# Patient Record
Sex: Female | Born: 1955
Health system: Southern US, Community
[De-identification: ages and names within clinical notes are randomized; demographics above are authoritative.]

## PROBLEM LIST (undated history)

## (undated) DIAGNOSIS — Z9889 Other specified postprocedural states: Secondary | ICD-10-CM

## (undated) DIAGNOSIS — I10 Essential (primary) hypertension: Secondary | ICD-10-CM

## (undated) DIAGNOSIS — F32A Depression, unspecified: Secondary | ICD-10-CM

## (undated) DIAGNOSIS — Z9081 Acquired absence of spleen: Secondary | ICD-10-CM

## (undated) DIAGNOSIS — R112 Nausea with vomiting, unspecified: Secondary | ICD-10-CM

## (undated) DIAGNOSIS — D75838 Other thrombocytosis: Secondary | ICD-10-CM

## (undated) DIAGNOSIS — F329 Major depressive disorder, single episode, unspecified: Secondary | ICD-10-CM

## (undated) DIAGNOSIS — R7989 Other specified abnormal findings of blood chemistry: Secondary | ICD-10-CM

## (undated) DIAGNOSIS — F419 Anxiety disorder, unspecified: Secondary | ICD-10-CM

## (undated) DIAGNOSIS — R0600 Dyspnea, unspecified: Secondary | ICD-10-CM

## (undated) DIAGNOSIS — J984 Other disorders of lung: Secondary | ICD-10-CM

## (undated) DIAGNOSIS — K589 Irritable bowel syndrome without diarrhea: Secondary | ICD-10-CM

## (undated) HISTORY — PX: ABDOMINAL HYSTERECTOMY: SHX81

## (undated) HISTORY — PX: BREAST BIOPSY: SHX20

## (undated) HISTORY — PX: ESOPHAGOGASTRODUODENOSCOPY: SHX1529

---

## 1998-05-27 ENCOUNTER — Encounter: Payer: Self-pay | Admitting: Family Medicine

## 1998-05-27 ENCOUNTER — Ambulatory Visit (HOSPITAL_COMMUNITY): Admission: RE | Admit: 1998-05-27 | Discharge: 1998-05-27 | Payer: Self-pay | Admitting: Family Medicine

## 1998-06-13 ENCOUNTER — Ambulatory Visit (HOSPITAL_COMMUNITY): Admission: RE | Admit: 1998-06-13 | Discharge: 1998-06-13 | Payer: Self-pay | Admitting: *Deleted

## 1998-07-18 ENCOUNTER — Encounter: Payer: Self-pay | Admitting: General Surgery

## 1998-07-23 ENCOUNTER — Encounter: Payer: Self-pay | Admitting: General Surgery

## 1998-07-23 ENCOUNTER — Observation Stay (HOSPITAL_COMMUNITY): Admission: RE | Admit: 1998-07-23 | Discharge: 1998-07-24 | Payer: Self-pay | Admitting: General Surgery

## 1998-10-29 ENCOUNTER — Encounter: Payer: Self-pay | Admitting: General Surgery

## 1998-10-29 ENCOUNTER — Ambulatory Visit (HOSPITAL_COMMUNITY): Admission: RE | Admit: 1998-10-29 | Discharge: 1998-10-29 | Payer: Self-pay | Admitting: General Surgery

## 1999-04-10 ENCOUNTER — Encounter: Payer: Self-pay | Admitting: General Surgery

## 1999-04-10 ENCOUNTER — Ambulatory Visit (HOSPITAL_COMMUNITY): Admission: RE | Admit: 1999-04-10 | Discharge: 1999-04-10 | Payer: Self-pay | Admitting: General Surgery

## 1999-04-27 ENCOUNTER — Ambulatory Visit (HOSPITAL_BASED_OUTPATIENT_CLINIC_OR_DEPARTMENT_OTHER): Admission: RE | Admit: 1999-04-27 | Discharge: 1999-04-27 | Payer: Self-pay | Admitting: General Surgery

## 2001-10-14 ENCOUNTER — Ambulatory Visit: Admission: RE | Admit: 2001-10-14 | Discharge: 2001-10-15 | Payer: Self-pay | Admitting: Orthopedic Surgery

## 2001-10-14 ENCOUNTER — Encounter: Payer: Self-pay | Admitting: General Surgery

## 2003-02-15 ENCOUNTER — Encounter: Admission: RE | Admit: 2003-02-15 | Discharge: 2003-02-15 | Payer: Self-pay | Admitting: Family Medicine

## 2003-02-15 ENCOUNTER — Encounter: Payer: Self-pay | Admitting: Family Medicine

## 2004-06-11 ENCOUNTER — Encounter (INDEPENDENT_AMBULATORY_CARE_PROVIDER_SITE_OTHER): Payer: Self-pay | Admitting: Specialist

## 2004-06-11 ENCOUNTER — Ambulatory Visit (HOSPITAL_COMMUNITY): Admission: RE | Admit: 2004-06-11 | Discharge: 2004-06-11 | Payer: Self-pay | Admitting: Gastroenterology

## 2005-10-03 ENCOUNTER — Emergency Department (HOSPITAL_COMMUNITY): Admission: EM | Admit: 2005-10-03 | Discharge: 2005-10-03 | Payer: Self-pay | Admitting: *Deleted

## 2005-10-05 ENCOUNTER — Encounter: Admission: RE | Admit: 2005-10-05 | Discharge: 2005-10-05 | Payer: Self-pay | Admitting: Family Medicine

## 2006-04-29 ENCOUNTER — Ambulatory Visit: Payer: Self-pay | Admitting: Family Medicine

## 2006-05-09 ENCOUNTER — Emergency Department: Payer: Self-pay | Admitting: Internal Medicine

## 2006-05-09 ENCOUNTER — Other Ambulatory Visit: Payer: Self-pay

## 2006-05-11 ENCOUNTER — Other Ambulatory Visit: Payer: Self-pay

## 2006-05-11 ENCOUNTER — Emergency Department: Payer: Self-pay

## 2006-05-12 ENCOUNTER — Ambulatory Visit: Payer: Self-pay | Admitting: Specialist

## 2006-07-14 ENCOUNTER — Ambulatory Visit: Payer: Self-pay | Admitting: Specialist

## 2006-08-17 ENCOUNTER — Ambulatory Visit: Payer: Self-pay | Admitting: Specialist

## 2006-09-12 ENCOUNTER — Ambulatory Visit: Payer: Self-pay | Admitting: Specialist

## 2006-10-07 ENCOUNTER — Ambulatory Visit: Payer: Self-pay | Admitting: Unknown Physician Specialty

## 2006-11-10 ENCOUNTER — Ambulatory Visit: Payer: Self-pay | Admitting: Family Medicine

## 2006-11-15 ENCOUNTER — Ambulatory Visit: Payer: Self-pay | Admitting: Family Medicine

## 2008-08-20 ENCOUNTER — Ambulatory Visit (HOSPITAL_BASED_OUTPATIENT_CLINIC_OR_DEPARTMENT_OTHER): Admission: RE | Admit: 2008-08-20 | Discharge: 2008-08-20 | Payer: Self-pay | Admitting: Orthopedic Surgery

## 2009-09-04 ENCOUNTER — Emergency Department (HOSPITAL_COMMUNITY): Admission: EM | Admit: 2009-09-04 | Discharge: 2009-09-04 | Payer: Self-pay | Admitting: Emergency Medicine

## 2009-10-14 ENCOUNTER — Inpatient Hospital Stay (HOSPITAL_COMMUNITY): Admission: EM | Admit: 2009-10-14 | Discharge: 2009-10-20 | Payer: Self-pay | Admitting: Emergency Medicine

## 2009-11-18 ENCOUNTER — Inpatient Hospital Stay: Payer: Self-pay | Admitting: Surgery

## 2010-01-16 ENCOUNTER — Ambulatory Visit: Payer: Self-pay | Admitting: Surgery

## 2010-03-12 ENCOUNTER — Encounter: Admission: RE | Admit: 2010-03-12 | Discharge: 2010-03-12 | Payer: Self-pay | Admitting: General Surgery

## 2010-04-17 ENCOUNTER — Ambulatory Visit: Payer: Self-pay | Admitting: Family Medicine

## 2010-08-17 LAB — BASIC METABOLIC PANEL
BUN: 2 mg/dL — ABNORMAL LOW (ref 6–23)
BUN: 6 mg/dL (ref 6–23)
CO2: 29 mEq/L (ref 19–32)
Chloride: 101 mEq/L (ref 96–112)
Chloride: 102 mEq/L (ref 96–112)
Chloride: 107 mEq/L (ref 96–112)
Creatinine, Ser: 0.67 mg/dL (ref 0.4–1.2)
GFR calc Af Amer: >60 mL/min (ref >60)
GFR calc Af Amer: >60 mL/min (ref >60)
GFR calc non Af Amer: >60 mL/min (ref >60)
Potassium: 3.2 mEq/L — ABNORMAL LOW (ref 3.5–5.1)
Potassium: 3.6 mEq/L (ref 3.5–5.1)
Potassium: 4.4 mEq/L (ref 3.5–5.1)
Sodium: 137 mEq/L (ref 135–145)
Sodium: 139 mEq/L (ref 135–145)

## 2010-08-17 LAB — CBC
HCT: 26.8 % — ABNORMAL LOW (ref 36.0–46.0)
HCT: 27.3 % — ABNORMAL LOW (ref 36.0–46.0)
HCT: 27.4 % — ABNORMAL LOW (ref 36.0–46.0)
HCT: 28.1 % — ABNORMAL LOW (ref 36.0–46.0)
HCT: 28.9 % — ABNORMAL LOW (ref 36.0–46.0)
HCT: 30.3 % — ABNORMAL LOW (ref 36.0–46.0)
Hemoglobin: 10.6 g/dL — ABNORMAL LOW (ref 12.0–15.0)
Hemoglobin: 10.6 g/dL — ABNORMAL LOW (ref 12.0–15.0)
Hemoglobin: 9.4 g/dL — ABNORMAL LOW (ref 12.0–15.0)
Hemoglobin: 9.9 g/dL — ABNORMAL LOW (ref 12.0–15.0)
MCHC: 34.3 g/dL (ref 30.0–36.0)
MCHC: 34.7 g/dL (ref 30.0–36.0)
MCHC: 35.1 g/dL (ref 30.0–36.0)
MCHC: 35.1 g/dL (ref 30.0–36.0)
MCHC: 35.2 g/dL (ref 30.0–36.0)
MCV: 88.4 fL (ref 78.0–100.0)
MCV: 89.1 fL (ref 78.0–100.0)
MCV: 89.6 fL (ref 78.0–100.0)
MCV: 89.9 fL (ref 78.0–100.0)
Platelets: 237 10*3/uL (ref 150–400)
Platelets: 238 10*3/uL (ref 150–400)
Platelets: 278 10*3/uL (ref 150–400)
Platelets: 281 10*3/uL (ref 150–400)
RBC: 3.01 MIL/uL — ABNORMAL LOW (ref 3.87–5.11)
RBC: 3.05 MIL/uL — ABNORMAL LOW (ref 3.87–5.11)
RBC: 3.05 MIL/uL — ABNORMAL LOW (ref 3.87–5.11)
RBC: 3.18 MIL/uL — ABNORMAL LOW (ref 3.87–5.11)
RBC: 3.42 MIL/uL — ABNORMAL LOW (ref 3.87–5.11)
RBC: 3.43 MIL/uL — ABNORMAL LOW (ref 3.87–5.11)
RDW: 13.1 % (ref 11.5–15.5)
RDW: 13.1 % (ref 11.5–15.5)
RDW: 13.2 % (ref 11.5–15.5)
RDW: 13.2 % (ref 11.5–15.5)
WBC: 11.4 10*3/uL — ABNORMAL HIGH (ref 4.0–10.5)
WBC: 15.3 10*3/uL — ABNORMAL HIGH (ref 4.0–10.5)
WBC: 15.9 10*3/uL — ABNORMAL HIGH (ref 4.0–10.5)
WBC: 16.9 10*3/uL — ABNORMAL HIGH (ref 4.0–10.5)
WBC: 18.8 10*3/uL — ABNORMAL HIGH (ref 4.0–10.5)

## 2010-08-17 LAB — POCT I-STAT, CHEM 8
BUN: 7 mg/dL (ref 6–23)
Creatinine, Ser: 0.6 mg/dL (ref 0.4–1.2)
HCT: 34 % — ABNORMAL LOW (ref 36.0–46.0)
Hemoglobin: 11.6 g/dL — ABNORMAL LOW (ref 12.0–15.0)

## 2010-08-17 LAB — DIFFERENTIAL
Basophils Absolute: 0 10*3/uL (ref 0.0–0.1)
Basophils Relative: 0 % (ref 0–1)
Eosinophils Absolute: 0 10*3/uL (ref 0.0–0.7)
Eosinophils Relative: 0 % (ref 0–5)
Lymphs Abs: 0.6 10*3/uL — ABNORMAL LOW (ref 0.7–4.0)
Lymphs Abs: 1.7 10*3/uL (ref 0.7–4.0)
Monocytes Absolute: 0.2 10*3/uL (ref 0.1–1.0)
Monocytes Relative: 7 % (ref 3–12)
Neutro Abs: 12.1 10*3/uL — ABNORMAL HIGH (ref 1.7–7.7)
Neutrophils Relative %: 94 % — ABNORMAL HIGH (ref 43–77)

## 2010-08-17 LAB — URINALYSIS, MICROSCOPIC ONLY
Bilirubin Urine: NEGATIVE
Glucose, UA: NEGATIVE mg/dL
Ketones, ur: NEGATIVE mg/dL
Protein, ur: 30 mg/dL — AB

## 2010-08-17 LAB — CROSSMATCH
ABO/RH(D): O POS
Antibody Screen: NEGATIVE

## 2010-08-17 LAB — COMPREHENSIVE METABOLIC PANEL
ALT: 43 U/L — ABNORMAL HIGH (ref 0–35)
AST: 68 U/L — ABNORMAL HIGH (ref 0–37)
Alkaline Phosphatase: 98 U/L (ref 39–117)
BUN: 7 mg/dL (ref 6–23)
Calcium: 9 mg/dL (ref 8.4–10.5)
Creatinine, Ser: 0.75 mg/dL (ref 0.4–1.2)
Glucose, Bld: 192 mg/dL — ABNORMAL HIGH (ref 70–99)
Total Protein: 6.4 g/dL (ref 6.0–8.3)

## 2010-08-17 LAB — MRSA PCR SCREENING: MRSA by PCR: NEGATIVE

## 2010-08-17 LAB — APTT: aPTT: 26 seconds (ref 24–37)

## 2010-08-17 LAB — PROTIME-INR: Prothrombin Time: 13.7 seconds (ref 11.6–15.2)

## 2010-08-17 LAB — CARDIAC PANEL(CRET KIN+CKTOT+MB+TROPI): CK, MB: 1.4 ng/mL (ref 0.3–4.0)

## 2010-08-19 LAB — COMPREHENSIVE METABOLIC PANEL
AST: 23 U/L (ref 0–37)
Albumin: 4.2 g/dL (ref 3.5–5.2)
BUN: 7 mg/dL (ref 6–23)
Calcium: 9.9 mg/dL (ref 8.4–10.5)
Chloride: 107 mEq/L (ref 96–112)
Creatinine, Ser: 0.59 mg/dL (ref 0.4–1.2)
GFR calc Af Amer: >60 mL/min (ref >60)
GFR calc non Af Amer: >60 mL/min (ref >60)
Total Bilirubin: 0.6 mg/dL (ref 0.3–1.2)

## 2010-08-19 LAB — CBC
HCT: 36.8 % (ref 36.0–46.0)
MCHC: 34.9 g/dL (ref 30.0–36.0)
MCV: 88 fL (ref 78.0–100.0)
Platelets: 241 10*3/uL (ref 150–400)

## 2010-08-19 LAB — D-DIMER, QUANTITATIVE: D-Dimer, Quant: <0.22 ug/mL-FEU (ref 0.00–0.48)

## 2010-08-19 LAB — DIFFERENTIAL
Basophils Absolute: 0 10*3/uL (ref 0.0–0.1)
Eosinophils Relative: 5 % (ref 0–5)
Lymphocytes Relative: 17 % (ref 12–46)
Lymphs Abs: 1.1 10*3/uL (ref 0.7–4.0)
Monocytes Absolute: 0.3 10*3/uL (ref 0.1–1.0)
Neutro Abs: 4.7 10*3/uL (ref 1.7–7.7)

## 2010-08-19 LAB — POCT CARDIAC MARKERS
CKMB, poc: 1.5 ng/mL (ref 1.0–8.0)
Myoglobin, poc: 67.2 ng/mL (ref 12–200)

## 2010-09-10 LAB — POCT I-STAT, CHEM 8
BUN: 8 mg/dL (ref 6–23)
Creatinine, Ser: 0.6 mg/dL (ref 0.4–1.2)
Glucose, Bld: 101 mg/dL — ABNORMAL HIGH (ref 70–99)
Potassium: 3.7 mEq/L (ref 3.5–5.1)
Sodium: 143 mEq/L (ref 135–145)

## 2010-10-13 NOTE — Op Note (Signed)
NAMESKYELER, SCALESE                ACCOUNT NO.:  [id number]   MEDICAL RECORD NO.:  [id number]          PATIENT TYPE:  AMB   LOCATION:  DSC                          FACILITY:  [facility]   PHYSICIAN:  Katy Fitch. Sypher, M.D. DATE OF BIRTH:  1956/01/12   DATE OF PROCEDURE:  08/20/2008  DATE OF DISCHARGE:                               OPERATIVE REPORT   PREOPERATIVE DIAGNOSIS:  Entrapment neuropathy, median nerve, right  carpal tunnel.   POSTOPERATIVE DIAGNOSIS:  Entrapment neuropathy, median nerve, right  carpal tunnel.   OPERATIONS:  Release of right transcarpal ligament.   OPERATIONS:  Katy Fitch. Sypher, MD   ASSISTANT:  Marveen Reeks Dasnoit, PA-C   ANESTHESIA:  General by LMA.   SUPERVISING ANESTHESIOLOGIST:  Germaine Pomfret, MD   INDICATIONS:  Holly Bowen is a well-known 55 year old woman who has  worked as a [employer] manager.  She has had a history of chronic hand numbness.  Months back, we diagnosed carpal tunnel syndrome.  She was treated with  steroid injection and splinting without success.  We recommend  electrodiagnostic studies.  Her worker's compensation carrier required  that she see an independent neurologist for consult.  She was seen by  Dr. Amelia Jo for evaluation and management and had a clinical  examination followed by electrodiagnostic studies.  Dr. Clarisse Gouge concluded  she did indeed have carpal tunnel syndrome.  She is now brought to the  operating room for release of her right transcarpal ligament due to the  presence of carpal tunnel syndrome unresponsive to nonoperative  measures.   After informed consent, she is brought to the operating room at this  time anticipating release of right transcarpal ligament.   PROCEDURE:  Jashanti Clinkscale was brought to the operating room and placed in  the supine position upon the operating table.   Following an anesthesia consult by Dr. Jairo Ben, general  anesthesia by LMA technique was recommended and accepted.  Ms. Pollino  was  brought to room #6 at the [facility], placed in the supine  position upon the operating table, and under Dr. Edison Pace direct  supervision general anesthesia by LMA technique was induced.   The right arm was prepped with Betadine soap and solution, sterilely  draped.  A pneumatic tourniquet was applied to the proximal right  brachium.   Following exsanguination of the right arm with Esmarch bandage, the  arterial tourniquet was inflated to 250 mmHg.  The procedure commenced  with a short incision in the line of the ring finger and palm.  Subcutaneous tissues were carefully divided revealing the palmar fascia.  This split longitudinally to the common sensory branch of the median  nerve.   These were followed back to the transverse carpal ligament which was  gently isolated from the median nerve.  The ligament was then released  along its ulnar border extending into the distal forearm.   This widely opened the carpal canal.  No mass or other predicaments were  noted.  Bleeding points along the margin of the released ligament were  electrocauterized with bipolar current followed by repair of  the skin  with intradermal 3-0 Prolene suture.   A compressive dressing was supplied with a volar plaster splint  maintaining the wrist in 5 degrees of dorsiflexion.   For aftercare, Ms. Wachs is provided a prescription for Percocet 5 mg 1  p.o. q.4-6 hours p.r.n. pain, 20 tablets without refill.      Katy Fitch Sypher, M.D.  Electronically Signed     RVS/MEDQ  D:  08/20/2008  T:  08/21/2008  Job:  [id number]

## 2010-10-16 NOTE — Op Note (Signed)
Holly Bowen, Holly Bowen                ACCOUNT NO.:  [id number]   MEDICAL RECORD NO.:  [id number]          PATIENT TYPE:  AMB   LOCATION:  ENDO                         FACILITY:  [facility]   PHYSICIAN:  Graylin Shiver, M.D.   DATE OF BIRTH:  1955/10/24   DATE OF PROCEDURE:  06/11/2004  DATE OF DISCHARGE:                                 OPERATIVE REPORT   INDICATIONS:  Family history of colon polyps.   Informed consent was obtained after explanation of the risks of bleeding,  infection and perforation.   PREMEDICATIONS:  Fentanyl 100 mcg IV, Versed 10 milligrams IV.   PROCEDURE:  With the patient in the left lateral decubitus position, a  rectal exam was performed.  No masses were felt.  The Olympus colonoscope  was inserted into the rectum and advanced around the colon to the cecum.  The cecal landmarks were identified.  In the cecum there was a 6-7 mm  elongated sessile polyp which was snared and removed by snare cautery  technique.  The cautery site looked good, and the polyp was retrieved. The  ascending colon looked normal.  The transverse colon looked normal.  Along  the length of the descending colon, there were four small sessile polyps  removed by snare cautery technique with a mini snare. The cautery sites  looked good.  The polyps were retrieved.  The sigmoid looked normal.  The  rectum looked normal.  She tolerated the procedure well without  complications.   IMPRESSION:  Multiple colon polyps.   PLAN:  The pathology will be checked.       SFG/MEDQ  D:  06/11/2004  T:  06/11/2004  Job:  [id number]   cc:   Clovis Riley, FNP  Deboraha Sprang at [facility]

## 2010-10-16 NOTE — Op Note (Signed)
[place]  Patient:    Holly Bowen                        MRN: [id number] Proc. Date: 04/27/99 Adm. Date:  [id number] Attending:  Brandy Hale CC:         Chales Salmon. Abigail Miyamoto, M.D.                           Operative Report  PREOPERATIVE DIAGNOSIS:  Multiple enlarging irregular nevi of the right face, right chest wall and left wrist.  POSTOPERATIVE DIAGNOSIS:  Multiple enlarging irregular nevi of the right face, right chest wall and left wrist.  OPERATION PERFORMED: 1. Excision of 1.5 cm diameter nevus of the left breast. 2. Excision of 8 mm diameter nevus of the right chest wall. 3. Excision of 3 mm nevus of the right face.  SURGEON:  Angelia Mould. Derrell Lolling, M.D.  ANESTHESIA:  INDICATIONS FOR PROCEDURE:  The patient is a 55 year old white female who was recently evaluated for multiple nevi.  She states that she has nevi of the left  breast, right chest wall and right face which have been enlarging and are itching. On examination, she has a 1.5 cm by 1.2 cm irregular shaped raised pigmented nevus of the left lateral breast skin.  She also has an 8 mm elliptical shaped, raised pigmented irregular bordered nevus of the right chest wall.  She also has a 3 mm raised nevus of the right face just on the right cheek bone below the lateral canthus of the right eye.  She is brought to [facility] minor surgery room for excision of these nevi.  DESCRIPTION OF PROCEDURE:  The patient was brought to the minor surgery room at  [facility]. We excised all three nevi with separate clean instruments following Betadine prepping and draping and local anesthesia with 1% Xylocaine with epinephrine.  We first excised the nevus of the left breast.  An oblique and generally transverse incision was made around the nevus in the left lateral breast.  About a 1 or 2 m margin was obtained and the specimen was  sent for pathology.  Hemostasis was excellent.  The skin was closed with a running subcuticular suture of 4-0 Vicryl and Steri-Strips.  A clean bandage was placed.  We then turned our attention to the right lateral chest wall where just at the right costal margin in the anterior axillary line on the right side was the chest wall nevus.  This was prepped and draped in a sterile fashion.  After local anesthesia, this was excised with a transverse elliptical incision again obtaining a 1 to 2 mm margin.  This was sent separately for histology.  The skin was closed with a running subcuticular suture of 4-0 Vicryl and Steri-Strips.  Clean bandages placed.  We then prepped the right face.  1% Xylocaine with epinephrine was used a local  anesthetic.  Very careful elliptical incision was made in Langers lines with a 1 mm margin.  The specimen was removed.  The skin was closed with subcuticular sutures of 6-0 Vicryl and Steri-Strips.  A clean bandage was placed.  The patient tolerated these procedures well and was returned to the waiting area in excellent condition.  Estimated blood loss was about 5 cc total.  Complications  were none.  Sponge, needle and instrument counts were correct. DD:  04/27/99 TD:  04/28/99 Job: [id number]

## 2010-12-10 DIAGNOSIS — I10 Essential (primary) hypertension: Secondary | ICD-10-CM | POA: Insufficient documentation

## 2010-12-10 DIAGNOSIS — K589 Irritable bowel syndrome without diarrhea: Secondary | ICD-10-CM | POA: Insufficient documentation

## 2010-12-10 DIAGNOSIS — F329 Major depressive disorder, single episode, unspecified: Secondary | ICD-10-CM | POA: Insufficient documentation

## 2011-01-11 ENCOUNTER — Encounter (HOSPITAL_COMMUNITY)
Admission: RE | Admit: 2011-01-11 | Discharge: 2011-01-11 | Disposition: A | Discharge status: Home / Self Care | Payer: Worker's Compensation | Source: Ambulatory Visit | Attending: Orthopedic Surgery | Admitting: Orthopedic Surgery

## 2011-01-11 ENCOUNTER — Other Ambulatory Visit (HOSPITAL_COMMUNITY): Payer: Self-pay | Admitting: Orthopedic Surgery

## 2011-01-11 ENCOUNTER — Ambulatory Visit (HOSPITAL_COMMUNITY)
Admission: RE | Admit: 2011-01-11 | Discharge: 2011-01-11 | Disposition: A | Discharge status: Home / Self Care | Payer: Worker's Compensation | Source: Ambulatory Visit | Attending: Orthopedic Surgery | Admitting: Orthopedic Surgery

## 2011-01-11 DIAGNOSIS — M542 Cervicalgia: Secondary | ICD-10-CM | POA: Insufficient documentation

## 2011-01-11 DIAGNOSIS — Z01818 Encounter for other preprocedural examination: Secondary | ICD-10-CM | POA: Insufficient documentation

## 2011-01-11 DIAGNOSIS — M79609 Pain in unspecified limb: Secondary | ICD-10-CM | POA: Insufficient documentation

## 2011-01-11 DIAGNOSIS — Z01812 Encounter for preprocedural laboratory examination: Secondary | ICD-10-CM | POA: Insufficient documentation

## 2011-01-11 DIAGNOSIS — M4722 Other spondylosis with radiculopathy, cervical region: Secondary | ICD-10-CM

## 2011-01-11 DIAGNOSIS — I10 Essential (primary) hypertension: Secondary | ICD-10-CM | POA: Insufficient documentation

## 2011-01-11 LAB — SURGICAL PCR SCREEN: MRSA, PCR: NEGATIVE

## 2011-01-11 LAB — CBC
HCT: 40.1 % (ref 36.0–46.0)
Hemoglobin: 13.6 g/dL (ref 12.0–15.0)
MCH: 30 pg (ref 26.0–34.0)
MCHC: 33.9 g/dL (ref 30.0–36.0)
MCV: 88.5 fL (ref 78.0–100.0)
RBC: 4.53 MIL/uL (ref 3.87–5.11)

## 2011-01-11 LAB — BASIC METABOLIC PANEL
Chloride: 103 mEq/L (ref 96–112)
GFR calc Af Amer: >60 mL/min (ref >60)
Potassium: 4.7 mEq/L (ref 3.5–5.1)

## 2011-01-21 ENCOUNTER — Ambulatory Visit (HOSPITAL_COMMUNITY): Payer: Worker's Compensation

## 2011-01-21 ENCOUNTER — Ambulatory Visit (HOSPITAL_COMMUNITY)
Admission: RE | Admit: 2011-01-21 | Discharge: 2011-01-22 | Disposition: A | Discharge status: Home / Self Care | Payer: Worker's Compensation | Source: Ambulatory Visit | Attending: Orthopedic Surgery | Admitting: Orthopedic Surgery

## 2011-01-21 DIAGNOSIS — M47812 Spondylosis without myelopathy or radiculopathy, cervical region: Secondary | ICD-10-CM | POA: Insufficient documentation

## 2011-01-21 DIAGNOSIS — Z01810 Encounter for preprocedural cardiovascular examination: Secondary | ICD-10-CM | POA: Insufficient documentation

## 2011-01-21 DIAGNOSIS — F411 Generalized anxiety disorder: Secondary | ICD-10-CM | POA: Insufficient documentation

## 2011-01-21 DIAGNOSIS — I10 Essential (primary) hypertension: Secondary | ICD-10-CM | POA: Insufficient documentation

## 2011-01-21 DIAGNOSIS — Z01812 Encounter for preprocedural laboratory examination: Secondary | ICD-10-CM | POA: Insufficient documentation

## 2011-01-21 DIAGNOSIS — M129 Arthropathy, unspecified: Secondary | ICD-10-CM | POA: Insufficient documentation

## 2011-01-21 DIAGNOSIS — J9819 Other pulmonary collapse: Secondary | ICD-10-CM | POA: Insufficient documentation

## 2011-01-21 DIAGNOSIS — K219 Gastro-esophageal reflux disease without esophagitis: Secondary | ICD-10-CM | POA: Insufficient documentation

## 2011-01-21 DIAGNOSIS — I517 Cardiomegaly: Secondary | ICD-10-CM | POA: Insufficient documentation

## 2011-01-21 DIAGNOSIS — Z01818 Encounter for other preprocedural examination: Secondary | ICD-10-CM | POA: Insufficient documentation

## 2011-01-22 NOTE — Op Note (Signed)
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  POSTOPERATIVE DIAGNOSIS:  Cervical spondylotic radiculopathy, C5-6 and C6-7.  OPERATIVE PROCEDURE:  Anterior cervical diskectomy and fusion, C5-6, C6- 7.  FIRST ASSISTANT:  Norval Gable, PA.  COMPLICATIONS:  None.  CONDITION:  Stable.  INTRAOPERATIVE FINDINGS:  No significant collapse at the C4-5 disk space.  On palpation, the disk appeared to be competent.  C5-6 and C6-7 anterior hard disk osteophytes posteriorly that were resected with 7-mm Titan intervertebral graft placed, medium lordotic, packed with DBX mixed with a 30-mm anterior cervical Synthes Vectra plate applied with 60-mm screws at C5 and 40-mm screws at C6 and C7.  HISTORY:  This is a very pleasant 55 year old woman who has been having significant progressive neck and radicular arm pain as a result of a work-related injury.  Attempts at conservative management had failed. These include activity modifications, medications both narcotic and nonnarcotic, mobile traction, injection therapy.  Because of the failed conservative management, she elected to proceed with surgery.  All appropriate risks, benefits, and alternatives were discussed with the patient.  OPERATIVE NOTE:  The patient was brought to the operating room, placed supine on the operating table.  After successful induction of general anesthesia, endotracheal intubation, TEDs, SCDs, and a Foley were inserted.  Rolled towels were placed between the shoulder blades.  The shoulders themselves were taped down.  An anterior cervical spine was prepped and draped in  standard fashion.  Appropriate time-out was done to confirm patient procedure, affected extremity, and all other pertinent important data.  At this point in time, I identified the C6 vertebral body, verify the surface marker, and made an incision.  This is a transverse left-sided incision.  A standard Clementeen Graham approach was taken to the anterior cervical spine.  I dissected down to and through the platysma and then bluntly dissected through the deep cervical fascia.  I identified the omohyoid muscle, released it from its fascial sling, and this allowed me to retract it without having sacrifice of it.  I then palpated the esophagus and trachea and flex them to the right with a finger and visualized and protected the carotid sheath laterally with a finger.  I then used two Kittner dissectors to mobilize the prevertebral fascia and completely expose the anterior cervical spine.  I then place the needle into the C5-6 disk space, took an x-ray and confirmed that I was at the appropriate level.  Once this was confirmed, I then used bipolar electrocautery to mobilize the longus coli muscles bilaterally from midbody of C5 to midbody of C7.  Once I had completed this, I was able to slip a 50-mm shadow line retractor underneath the longus coli muscles and then opened the retractors to the appropriate width.  As I did this, I did deflate and  reinflate the endotracheal cuff to reset the pressure.  Distraction pins were placed into bodies of C6 and C7.  I distracted the disk space.  At this point, a #15 blade scalpel was used to perform an annulotomy. Then, using a combination of pituitary rongeurs, curettes, and Kerrison rongeurs, I removed all the disk material.  I then took a fine nerve hook, developed plane underneath the posterior longitudinal ligament, and exploited this with a 1-mm Kerrison to remove it.  I then trimmed down the bone spurs along the posterior aspect of the vertebral  body. At this point, I could freely pass a nerve hook underneath the uncovertebral joint and along the endplates.  I then rasped the endplates and confirmed I had bleeding subchondral bone.  I irrigated copiously with normal saline and then trialed the 7 medium lordotic spacer and felt to have an adequate fixation, and so I took the actual implant, packed it with BDX mix, and malleted it to the appropriate depth.  At this point, I removed the distraction pin from the C7 vertebral body, repositioned into the C5 vertebral body, distracted the C5-6 disk space, and using the same technique, performed a complete and thorough diskectomy with resection of posterior longitudinal ligament at C5-6.  I again trialed after rasping the endplates and placed the same size graft packed with DBX mix.  At this point, once both grafts were properly positioned, I removed the distraction pins and trialed anterior cervical plates.  The 30-mm plate was contoured with gentle lordosis at the C6-7 level and then secured with 16-mm screws at the body of C5.  I took great care to ensure that the plate was as close to the C5-6 disk space as possible to decrease the stress to the C4-5 uninvolved level and hopefully prevent adjacent segment disease.  I had good fixation with all fixed screws.  I then checked to ensure the esophagus did not inadvertently become entrapped beneath the plate and it had not.  I then obtained hemostasis using bipolar electrocautery and maintained it with FloSeal.  I then irrigated the wound copiously with saline.  I then returned the trachea and esophagus to midline and then closed the platysma with interrupted 2-0 Vicryl sutures and the skin with 3-0 Monocryl.  Steri-Strips and dry dressing were applied.  The patient was extubated, transferred to PACU, and the Foley was removed.     Alvy Beal, MD     DDB/MEDQ  D:  01/21/2011  T:  01/21/2011  Job:  [id number]  Electronically  Signed by Venita Lick MD on 01/22/2011 07:53:01 AM

## 2011-12-26 ENCOUNTER — Emergency Department: Payer: Self-pay | Admitting: Emergency Medicine

## 2012-09-08 DIAGNOSIS — R1012 Left upper quadrant pain: Secondary | ICD-10-CM | POA: Insufficient documentation

## 2013-01-31 DIAGNOSIS — S3600XA Unspecified injury of spleen, initial encounter: Secondary | ICD-10-CM | POA: Insufficient documentation

## 2013-05-31 HISTORY — PX: OTHER SURGICAL HISTORY: SHX169

## 2013-11-27 DIAGNOSIS — S36039A Unspecified laceration of spleen, initial encounter: Secondary | ICD-10-CM | POA: Insufficient documentation

## 2013-11-27 DIAGNOSIS — J984 Other disorders of lung: Secondary | ICD-10-CM | POA: Insufficient documentation

## 2013-11-27 DIAGNOSIS — R0602 Shortness of breath: Secondary | ICD-10-CM | POA: Insufficient documentation

## 2014-02-14 DIAGNOSIS — Z9081 Acquired absence of spleen: Secondary | ICD-10-CM | POA: Insufficient documentation

## 2014-02-14 DIAGNOSIS — R7989 Other specified abnormal findings of blood chemistry: Secondary | ICD-10-CM

## 2014-02-14 DIAGNOSIS — D75838 Other thrombocytosis: Secondary | ICD-10-CM | POA: Insufficient documentation

## 2014-02-25 ENCOUNTER — Telehealth (HOSPITAL_COMMUNITY): Payer: Self-pay

## 2014-02-25 NOTE — Telephone Encounter (Signed)
I have called and left a message with Kenlynn to inquire about participation in Pulmonary Rehab. Will send letter in mail and follow up.

## 2014-03-08 ENCOUNTER — Encounter (HOSPITAL_COMMUNITY)
Admission: RE | Admit: 2014-03-08 | Discharge: 2014-03-08 | Disposition: A | Discharge status: Home / Self Care | Payer: Worker's Compensation | Source: Ambulatory Visit | Attending: Internal Medicine | Admitting: Internal Medicine

## 2014-03-08 ENCOUNTER — Encounter (HOSPITAL_COMMUNITY): Payer: Self-pay

## 2014-03-08 VITALS — BP 156/87 | HR 68

## 2014-03-08 DIAGNOSIS — R0602 Shortness of breath: Secondary | ICD-10-CM | POA: Diagnosis not present

## 2014-03-08 DIAGNOSIS — Z5189 Encounter for other specified aftercare: Secondary | ICD-10-CM | POA: Insufficient documentation

## 2014-03-08 NOTE — Progress Notes (Addendum)
Holly Bowen 58 y.o. female Pulmonary Rehab Orientation Note Patient arrived today in [facility] for orientation to Pulmonary Rehab. She ambulated from the parking deck with minimal difficulty. She does not carry portable oxygen. Per pt, she uses oxygen never. Color good, skin warm and dry. Patient is oriented to time and place. Patient's medical history and medications reviewed. Heart rate is normal, breath sounds clear to auscultation, no wheezes, rales, or rhonchi. Grip strength equal, strong. Distal pulses palpable. Patient reports she does take medications as prescribed. Patient states she follows a Regular diet. The patient reports no specific efforts to gain or lose weight. She did state she has gained a significant amount of weight since her accident.She also scored high on her PHQ2 and PHQ9. She is currently on welbutrin. She stated she was in counseling at [facility] but dropped her sessions r/t feeling she was not benefiting from her sessions. She was offered counseling at today's session but refused. Patient denies thoughts of harming herself. Encouraged to seek care if she became more depressed or had thoughts of harming herself. Her workmen's comp case manager was present during this orientation and aware of her depression. She describes a poor appetite, but eating out of boredom. Patient's weight will be monitored closely. Demonstration and practice of PLB using pulse oximeter. Patient able to return demonstration satisfactorily. Safety and hand hygiene in the exercise area reviewed with patient. Patient voices understanding of the information reviewed. Department expectations discussed with patient and achievable goals were set. The patient shows enthusiasm about attending the program and we look forward to working with this nice lady. The patient is scheduled for a 6 min walk test on 10/13 and to begin exercise on 10/15 at 1:30.   45 minutes was spent on a variety of activities such as  assessment of the patient, obtaining baseline data including height, weight, BMI, and grip strength, verifying medical history, allergies, and current medications, and teaching patient strategies for performing tasks with less respiratory effort with emphasis on pursed lip breathing.

## 2014-03-12 ENCOUNTER — Encounter (HOSPITAL_COMMUNITY)
Admission: RE | Admit: 2014-03-12 | Discharge: 2014-03-12 | Disposition: A | Discharge status: Home / Self Care | Payer: Worker's Compensation | Source: Ambulatory Visit | Attending: Internal Medicine | Admitting: Internal Medicine

## 2014-03-12 DIAGNOSIS — Z5189 Encounter for other specified aftercare: Secondary | ICD-10-CM | POA: Diagnosis not present

## 2014-03-12 NOTE — Progress Notes (Addendum)
Holly Bowen completed a Six-Minute Walk Test on 03/12/14 . Holly Bowen walked 1,253 feet with 0 breaks.  The patient's lowest oxygen saturation was 96% , highest heart rate was 92 , and highest blood pressure was 138/72. The patient was on room air.  Holly Bowen stated that shortness of breath hindered their walk test.

## 2014-03-14 ENCOUNTER — Encounter (HOSPITAL_COMMUNITY)
Admission: RE | Admit: 2014-03-14 | Discharge: 2014-03-14 | Disposition: A | Discharge status: Home / Self Care | Payer: Worker's Compensation | Source: Ambulatory Visit | Attending: Internal Medicine | Admitting: Internal Medicine

## 2014-03-14 DIAGNOSIS — Z5189 Encounter for other specified aftercare: Secondary | ICD-10-CM | POA: Diagnosis not present

## 2014-03-14 NOTE — Progress Notes (Signed)
Today, Mennie exercised at [employer]. Service time was from 1330 to 1515.  The patient exercised for more than 31 minutes performing aerobic, strengthening, and stretching exercises. Oxygen saturation, heart rate, blood pressure, rate of perceived exertion, and shortness of breath were all monitored before, during, and after exercise. Carmelita presented with no problems at today's exercise session. Patient attended the oxygen safety class today.    There was no workload change during today's exercise session.  Pre-exercise vitals:   Weight kg: 81.9   Liters of O2: RA   SpO2: 97   HR: 66   BP: 128/74   CBG: NA  Exercise vitals:   Highest heartrate:  92   Lowest oxygen saturation: 97   Highest blood pressure: 150/84   Liters of 02: RA  Post-exercise vitals:   SpO2: 95   HR: 79   BP: 118/78   Liters of O2: RA   CBG: NA Dr. Brand Males, Medical Director Dr. Aileen Fass is immediately available during today's Pulmonary Rehab session for [facility] on 03/14/2014 at 1330 class time.

## 2014-03-19 ENCOUNTER — Encounter (HOSPITAL_COMMUNITY): Payer: Worker's Compensation

## 2014-03-21 ENCOUNTER — Encounter (HOSPITAL_COMMUNITY)
Admission: RE | Admit: 2014-03-21 | Discharge: 2014-03-21 | Disposition: A | Discharge status: Home / Self Care | Payer: Worker's Compensation | Source: Ambulatory Visit | Attending: Internal Medicine | Admitting: Internal Medicine

## 2014-03-21 DIAGNOSIS — Z5189 Encounter for other specified aftercare: Secondary | ICD-10-CM | POA: Diagnosis not present

## 2014-03-21 NOTE — Progress Notes (Signed)
Today, Holly Bowen exercised at [employer]. Service time was from 1330 to 1515.  The patient exercised for more than 31 minutes performing aerobic, strengthening, and stretching exercises. Oxygen saturation, heart rate, blood pressure, rate of perceived exertion, and shortness of breath were all monitored before, during, and after exercise. [name] presented with no problems at today's exercise session. Patient attended the [place] services class today.   There was one workload change during today's exercise session.  Pre-exercise vitals:   Weight kg: 82.9  This is up 1 kg from 7 days ago, patient has been eating more   Liters of O2: RA   SpO2: 95   HR: 75   BP: 102/60   CBG: NA  Exercise vitals:   Highest heartrate:  94   Lowest oxygen saturation: 95   Highest blood pressure: 114/60   Liters of 02: RA  Post-exercise vitals:   SpO2: 97   HR: 71   BP: 112/60   Liters of O2: RA   CBG: NA Dr. Brand Males, Medical Director Dr. Wynelle Cleveland is immediately available during today's Pulmonary Rehab session for [facility] on 03/21/2014 at 1330 class time.

## 2014-03-26 ENCOUNTER — Encounter (HOSPITAL_COMMUNITY)
Admission: RE | Admit: 2014-03-26 | Discharge: 2014-03-26 | Disposition: A | Discharge status: Home / Self Care | Payer: Worker's Compensation | Source: Ambulatory Visit | Attending: Internal Medicine | Admitting: Internal Medicine

## 2014-03-26 DIAGNOSIS — Z5189 Encounter for other specified aftercare: Secondary | ICD-10-CM | POA: Diagnosis not present

## 2014-03-26 NOTE — Progress Notes (Signed)
Today, Holly Bowen exercised at [employer]. Service time was from 1330 to 1500.  The patient exercised for more than 31 minutes performing aerobic, strengthening, and stretching exercises. Oxygen saturation, heart rate, blood pressure, rate of perceived exertion, and shortness of breath were all monitored before, during, and after exercise. Holly Bowen presented with no problems at today's exercise session.   There was a workload change during today's exercise session.  Pre-exercise vitals:   Weight kg: 81.8   Liters of O2: ra   SpO2: 96   HR: 67   BP: 148/84   CBG: na  Exercise vitals:   Highest heartrate:  92   Lowest oxygen saturation: 94   Highest blood pressure: 144/80   Liters of 02: ra  Post-exercise vitals:   SpO2: 98   HR: 73   BP: 136/60   Liters of O2:  ra   CBG: na  Dr. Brand Males, Medical Director Dr. Wynelle Cleveland is immediately available during today's Pulmonary Rehab session for [facility] on 03/26/2014 at 1330 class time.

## 2014-03-28 ENCOUNTER — Encounter (HOSPITAL_COMMUNITY)
Admission: RE | Admit: 2014-03-28 | Discharge: 2014-03-28 | Disposition: A | Discharge status: Home / Self Care | Payer: Worker's Compensation | Source: Ambulatory Visit | Attending: Internal Medicine | Admitting: Internal Medicine

## 2014-03-28 DIAGNOSIS — Z5189 Encounter for other specified aftercare: Secondary | ICD-10-CM | POA: Diagnosis not present

## 2014-03-28 NOTE — Progress Notes (Signed)
I have reviewed a Home Exercise Prescription with Holly Bowen . Holly Bowen is not currently exercising at home.  The patient was advised to walk 2-3 days a week for 25-30 minutes.  Holly Bowen and I discussed how to progress their exercise prescription.  The patient stated that their goals were to be able to walk and talk with friends, get back to her normal daily life activities.  The patient stated that they understand the exercise prescription.  We reviewed exercise guidelines, target heart rate during exercise, oxygen use, weather, home pulse oximeter, endpoints for exercise, and goals.  Patient is encouraged to come to me with any questions. I will continue to follow up with the patient to assist them with progression and safety.

## 2014-03-28 NOTE — Progress Notes (Signed)
Today, Holly Bowen exercised at [employer]. Service time was from 1330 to 1530.  The patient exercised for more than 31 minutes performing aerobic, strengthening, and stretching exercises. Oxygen saturation, heart rate, blood pressure, rate of perceived exertion, and shortness of breath were all monitored before, during, and after exercise. Holly Bowen presented with no problems at today's exercise session.   There was one workload change during today's exercise session.  Pre-exercise vitals:   Weight kg: 82.4   Liters of O2: RA   SpO2: 94   HR: 74   BP: 108/58   CBG: na  Exercise vitals:   Highest heartrate:  83   Lowest oxygen saturation: 95   Highest blood pressure: 140/80   Liters of 02: RA  Post-exercise vitals:   SpO2: 96   HR: 67   BP: 112/62   Liters of O2: RA   CBG: NA Dr. Brand Males, Medical Director Dr. Aileen Fass is immediately available during today's Pulmonary Rehab session for [facility] on 03/28/2014 at 1330 class time.

## 2014-04-02 ENCOUNTER — Telehealth (HOSPITAL_COMMUNITY): Payer: Self-pay | Admitting: *Deleted

## 2014-04-02 ENCOUNTER — Encounter (HOSPITAL_COMMUNITY): Payer: Worker's Compensation

## 2014-04-04 ENCOUNTER — Encounter (HOSPITAL_COMMUNITY)
Admission: RE | Admit: 2014-04-04 | Discharge: 2014-04-04 | Disposition: A | Discharge status: Home / Self Care | Payer: Worker's Compensation | Source: Ambulatory Visit | Attending: Internal Medicine | Admitting: Internal Medicine

## 2014-04-04 DIAGNOSIS — Z5189 Encounter for other specified aftercare: Secondary | ICD-10-CM | POA: Insufficient documentation

## 2014-04-04 DIAGNOSIS — R0602 Shortness of breath: Secondary | ICD-10-CM | POA: Diagnosis not present

## 2014-04-04 NOTE — Progress Notes (Signed)
Today, [name] exercised at [employer]. [facility]. Service time was from 1:30pm to 3:15pm.  The patient exercised for more than 31 minutes performing aerobic, strengthening, and stretching exercises. Oxygen saturation, heart rate, blood pressure, rate of perceived exertion, and shortness of breath were all monitored before, during, and after exercise. [name] presented with no problems at today's exercise session. Patient attended education class with Derek [place] on [employer].  There was no workload change during today's exercise session.  Pre-exercise vitals: . Weight kg: 81.3 . Liters of O2: ra . SpO2: 94 . HR: 75 . BP: 140/80 . CBG: na  Exercise vitals: . Highest heartrate:  86 . Lowest oxygen saturation: 97 . Highest blood pressure: 126/78 . Liters of 02: ra  Post-exercise vitals: . SpO2: 96 . HR: 75 . BP: 134/84 . Liters of O2: ra . CBG: na  Dr. Brand Males, Medical Director Dr. Wynelle Cleveland is immediately available during today's Pulmonary Rehab session for [facility] on 04/04/14 at 1:30pm class time.

## 2014-04-09 ENCOUNTER — Encounter (HOSPITAL_COMMUNITY)
Admission: RE | Admit: 2014-04-09 | Discharge: 2014-04-09 | Disposition: A | Discharge status: Home / Self Care | Payer: Worker's Compensation | Source: Ambulatory Visit | Attending: Internal Medicine | Admitting: Internal Medicine

## 2014-04-09 DIAGNOSIS — Z5189 Encounter for other specified aftercare: Secondary | ICD-10-CM | POA: Diagnosis not present

## 2014-04-09 NOTE — Progress Notes (Signed)
Today, Holly Bowen exercised at [employer]. Service time was from 1330 to 1450.  The patient exercised for more than 31 minutes performing aerobic, strengthening, and stretching exercises. Oxygen saturation, heart rate, blood pressure, rate of perceived exertion, and shortness of breath were all monitored before, during, and after exercise. [name] presented with no problems at today's exercise session.   There was a workload change during today's exercise session.  Pre-exercise vitals: . Weight kg: 80.2 . Liters of O2: ra . SpO2: 95 . HR: 66 . BP: 102/60 . CBG: na  Exercise vitals: . Highest heartrate:  90 . Lowest oxygen saturation: 96 . Highest blood pressure: 134/60 . Liters of 02: ra  Post-exercise vitals: . SpO2: 96 . HR: 64 . BP: 102/64 . Liters of O2: ra . CBG: na  Dr. Brand Males, Medical Director Dr. Wynelle Cleveland is immediately available during today's Pulmonary Rehab session for Holly Bowen on 04/09/2014 at 1330 class time.

## 2014-04-09 NOTE — Progress Notes (Signed)
Holly Bowen 58 y.o. female Nutrition Note Spoke with pt. Pt is obese. Pt wants to lose wt. Per discussion, pt previously on Weight Watchers and pt plans on restarting her Weight Watcher's diet. Pt eats 2 meals "sometimes less than 2 meals a day." There are some ways pt can make her eating habits healthier. Pt's Rate Your Plate results reviewed with pt. Barriers to making healthier food choices include pt c/o decreased appetite, depression, and not liking "healthy foods." Pt reports waking up nightly "and I always get something to eat whether I'm hungry or not." Establishing a regular eating pattern discussed. Pt eats out frequently (5 meals/week) for convenience and socialization.  Pt does not avoid salty food; uses some canned/ convenience food.  Pt does not add salt to food.  The role of sodium in lung disease reviewed with pt. Pt expressed understanding of the information reviewed. Pt expressed understanding.  Nutrition Diagnosis ? Food-and nutrition-related knowledge deficit related to lack of exposure to information as related to diagnosis of pulmonary disease ? Obesity related to excessive energy intake as evidenced by a BMI of 30.1 ?  Nutrition Intervention ? Pt's individual nutrition plan and goals reviewed with pt. ? Benefits of adopting healthy eating habits discussed when pt's Rate Your Plate reviewed. ? Pt to attend the Nutrition and Lung Disease class ? Continual client-centered nutrition education by RD, as part of interdisciplinary care. Goal(s) 1. Identify food quantities necessary to achieve wt loss at graduation from pulmonary rehab. 2. Describe the benefit of including fruits, vegetables, whole grains, and low-fat dairy products in a healthy meal plan. Monitor and Evaluate progress toward nutrition goal with team.   Derek Mound, M.Ed, RD, LDN, CDE 04/09/2014 2:38 PM

## 2014-04-11 ENCOUNTER — Encounter (HOSPITAL_COMMUNITY)
Admission: RE | Admit: 2014-04-11 | Discharge: 2014-04-11 | Disposition: A | Discharge status: Home / Self Care | Payer: Worker's Compensation | Source: Ambulatory Visit | Attending: Internal Medicine | Admitting: Internal Medicine

## 2014-04-11 DIAGNOSIS — Z5189 Encounter for other specified aftercare: Secondary | ICD-10-CM | POA: Diagnosis not present

## 2014-04-11 NOTE — Progress Notes (Signed)
Today, [name] exercised at [employer]. Service time was from 1330 to 1520.  The patient exercised for more than 31 minutes performing aerobic, strengthening, and stretching exercises. Oxygen saturation, heart rate, blood pressure, rate of perceived exertion, and shortness of breath were all monitored before, during, and after exercise. Holly Bowen presented with no problems at today's exercise session. Patient attended the oxygen safety class today.  There was no workload change during today's exercise session.  Pre-exercise vitals: . Weight kg: 81.1 . Liters of O2: RA . SpO2: 96 . HR: 66 . BP: 100/60 . CBG: NA  Exercise vitals: . Highest heartrate:  91 . Lowest oxygen saturation: 94 . Highest blood pressure: 138/64 . Liters of 02: RA  Post-exercise vitals: . SpO2: 97 . HR: 67 . BP: 116/70 . Liters of O2: RA . CBG: NA Dr. Brand Males, Medical Director Dr. Wyline Copas is immediately available during today's Pulmonary Rehab session for [facility] on 04/11/2014 at 1330 class time.  Marland Kitchen

## 2014-04-16 ENCOUNTER — Encounter (HOSPITAL_COMMUNITY): Payer: Worker's Compensation

## 2014-04-18 ENCOUNTER — Encounter (HOSPITAL_COMMUNITY)
Admission: RE | Admit: 2014-04-18 | Discharge: 2014-04-18 | Disposition: A | Discharge status: Home / Self Care | Payer: Worker's Compensation | Source: Ambulatory Visit | Attending: Internal Medicine | Admitting: Internal Medicine

## 2014-04-18 DIAGNOSIS — Z5189 Encounter for other specified aftercare: Secondary | ICD-10-CM | POA: Diagnosis not present

## 2014-04-18 NOTE — Progress Notes (Addendum)
Today, Holly Bowen exercised at [employer]. Service time was from 1330 to 1520.  The patient exercised for more than 31 minutes performing aerobic, strengthening, and stretching exercises. Oxygen saturation, heart rate, blood pressure, rate of perceived exertion, and shortness of breath were all monitored before, during, and after exercise. Holly Bowen presented with no problems at today's exercise session.   There was no workload change during today's exercise session.  Pre-exercise vitals: . Weight kg: 81.1 . Liters of O2: RA . SpO2: 96 . HR: 60 . BP: 142/90 . CBG: NA  Exercise vitals: . Highest heartrate:  90 . Lowest oxygen saturation: 95 . Highest blood pressure: 122/70 . Liters of 02: RA  Post-exercise vitals: . SpO2: 98 . HR: 68 . BP: 132/70 . Liters of O2: RA . CBG: NA Dr. Brand Males, Medical Director Dr. Wynelle Cleveland is immediately available during today's Pulmonary Rehab session for [name] on 04/18/2014 at 1330 class time.  . Patient attended the exercise for the pulmonary patient class today.

## 2014-04-23 ENCOUNTER — Encounter (HOSPITAL_COMMUNITY): Payer: Worker's Compensation

## 2014-04-25 ENCOUNTER — Encounter (HOSPITAL_COMMUNITY): Payer: Worker's Compensation

## 2014-04-30 ENCOUNTER — Encounter (HOSPITAL_COMMUNITY): Payer: Worker's Compensation

## 2014-05-02 ENCOUNTER — Encounter (HOSPITAL_COMMUNITY): Payer: Worker's Compensation

## 2014-05-07 ENCOUNTER — Encounter (HOSPITAL_COMMUNITY): Admission: RE | Admit: 2014-05-07 | Payer: Worker's Compensation | Source: Ambulatory Visit

## 2014-05-09 ENCOUNTER — Encounter (HOSPITAL_COMMUNITY): Payer: Worker's Compensation

## 2014-05-14 ENCOUNTER — Encounter (HOSPITAL_COMMUNITY): Payer: Worker's Compensation

## 2014-05-16 ENCOUNTER — Encounter (HOSPITAL_COMMUNITY): Payer: Worker's Compensation

## 2014-05-21 ENCOUNTER — Encounter (HOSPITAL_COMMUNITY): Payer: Worker's Compensation

## 2014-05-30 ENCOUNTER — Encounter (HOSPITAL_COMMUNITY)
Admission: RE | Admit: 2014-05-30 | Discharge: 2014-05-30 | Disposition: A | Discharge status: Home / Self Care | Payer: Worker's Compensation | Source: Ambulatory Visit | Attending: Internal Medicine | Admitting: Internal Medicine

## 2014-05-30 DIAGNOSIS — R0602 Shortness of breath: Secondary | ICD-10-CM | POA: Insufficient documentation

## 2014-05-30 DIAGNOSIS — Z5189 Encounter for other specified aftercare: Secondary | ICD-10-CM | POA: Insufficient documentation

## 2014-05-30 NOTE — Progress Notes (Signed)
Today, Holly Bowen exercised at [employer]. Service time was from 1330 to 1530.  The patient exercised for more than 31 minutes performing aerobic, strengthening, and stretching exercises. Oxygen saturation, heart rate, blood pressure, rate of perceived exertion, and shortness of breath were all monitored before, during, and after exercise. Holly Bowen presented with no problems at today's exercise session. Patient attended the warning signs and symptoms class today.  There was no workload change during today's exercise session.  Pre-exercise vitals: . Weight kg: 82.7 . Liters of O2: ra . SpO2: 95 . HR: 68 . BP: 122/80 . CBG: na  Exercise vitals: . Highest heartrate:  97 . Lowest oxygen saturation: 95 . Highest blood pressure: 144/64 . Liters of 02: ra  Post-exercise vitals: . SpO2: 95 . HR: 73 . BP: 116/70 . Liters of O2: ra . CBG: na Dr. Brand Males, Medical Director Dr. Candiss Norse is immediately available during today's Pulmonary Rehab session for [facility] on 05/30/2014 at 1330 class time.

## 2014-06-04 ENCOUNTER — Encounter (HOSPITAL_COMMUNITY)
Admission: RE | Admit: 2014-06-04 | Discharge: 2014-06-04 | Disposition: A | Discharge status: Home / Self Care | Payer: Worker's Compensation | Source: Ambulatory Visit | Attending: Internal Medicine | Admitting: Internal Medicine

## 2014-06-04 DIAGNOSIS — Z5189 Encounter for other specified aftercare: Secondary | ICD-10-CM | POA: Diagnosis present

## 2014-06-04 DIAGNOSIS — R0602 Shortness of breath: Secondary | ICD-10-CM | POA: Diagnosis not present

## 2014-06-04 NOTE — Progress Notes (Signed)
Today, Holly Bowen exercised at [employer]. Service time was from 1330 to 1515.  The patient exercised for more than 31 minutes performing aerobic, strengthening, and stretching exercises. Oxygen saturation, heart rate, blood pressure, rate of perceived exertion, and shortness of breath were all monitored before, during, and after exercise. [name] presented with no problems at today's exercise session.   There was one workload change during today's exercise session.  Pre-exercise vitals: . Weight kg: 81.9 . Liters of O2: ra . SpO2: 98 . HR: 74 . BP: 142/84 . CBG: na  Exercise vitals: . Highest heartrate:  144/72 . Lowest oxygen saturation: 95 . Highest blood pressure: 144/72 . Liters of 02: ra  Post-exercise vitals: . SpO2: 94 . HR: 78 . BP: 116/64 . Liters of O2: ra . CBG: na Dr. Brand Males, Medical Director Dr. Coralyn Pear is immediately available during today's Pulmonary Rehab session for [facility] on 06/04/2013 at 1330 class time.  Marland Kitchen

## 2014-06-06 ENCOUNTER — Encounter (HOSPITAL_COMMUNITY)
Admission: RE | Admit: 2014-06-06 | Discharge: 2014-06-06 | Disposition: A | Discharge status: Home / Self Care | Payer: Worker's Compensation | Source: Ambulatory Visit | Attending: Internal Medicine | Admitting: Internal Medicine

## 2014-06-06 DIAGNOSIS — Z5189 Encounter for other specified aftercare: Secondary | ICD-10-CM | POA: Diagnosis not present

## 2014-06-06 NOTE — Progress Notes (Signed)
Today, Shania exercised at [employer]. Service time was from 1330 to 1530.  The patient exercised for more than 31 minutes performing aerobic, strengthening, and stretching exercises. Oxygen saturation, heart rate, blood pressure, rate of perceived exertion, and shortness of breath were all monitored before, during, and after exercise. Joliana presented with no problems at today's exercise session. Patient attended the Risk Factor Reduction class today.  There was no workload change during today's exercise session.  Pre-exercise vitals: . Weight kg: 82.6 . Liters of O2: ra . SpO2: 96 . HR: 67 . BP: 108/80 . CBG: na  Exercise vitals: . Highest heartrate:  94 . Lowest oxygen saturation: 95 . Highest blood pressure: 142/68 . Liters of 02: ra  Post-exercise vitals: . SpO2: 97 . HR: 72 . BP: 122/68 . Liters of O2: ra . CBG: na Dr. Brand Males, Medical Director Dr. Coralyn Pear is immediately available during today's Pulmonary Rehab session for [facility] on 06/06/2014 at 1330 class time.  Marland Kitchen

## 2014-06-11 ENCOUNTER — Encounter (HOSPITAL_COMMUNITY)
Admission: RE | Admit: 2014-06-11 | Discharge: 2014-06-11 | Disposition: A | Discharge status: Home / Self Care | Payer: Worker's Compensation | Source: Ambulatory Visit | Attending: Internal Medicine | Admitting: Internal Medicine

## 2014-06-11 NOTE — Progress Notes (Signed)
Holly Bowen came to pulmonary rehab to exercise today, blood pressure 144/100, heart rate 68, sao2 95% on RA.  This is an elevated blood pressure for patient and when questioned she confessed she fell 4 days ago and fell on her shoulder, heard it pop, and has been had shoulder pain 10 out of 10.  Patient has been a patient of Dr. Tamera Punt in the past @ [facility].  I called and obtained an appointment for patient with Dr. Tamera Punt tomorrow, June 12, 2014 @ 10:45.  Patient was not allowed to exercise today.  She was agreeable with the arrangements.  Last BP was 150/80.

## 2014-06-13 ENCOUNTER — Encounter (HOSPITAL_COMMUNITY)
Admission: RE | Admit: 2014-06-13 | Discharge: 2014-06-13 | Disposition: A | Discharge status: Home / Self Care | Payer: Worker's Compensation | Source: Ambulatory Visit | Attending: Internal Medicine | Admitting: Internal Medicine

## 2014-06-13 DIAGNOSIS — Z5189 Encounter for other specified aftercare: Secondary | ICD-10-CM | POA: Diagnosis not present

## 2014-06-13 NOTE — Progress Notes (Signed)
Today, Holly Bowen exercised at [employer]. Service time was from 1230 to 1445.  The patient exercised for more than 31 minutes performing aerobic, strengthening, and stretching exercises. Oxygen saturation, heart rate, blood pressure, rate of perceived exertion, and shortness of breath were all monitored before, during, and after exercise. Holly Bowen presented with no problems at today's exercise session. Holly Bowen also attended a question and answer session with Md  There was no workload change during today's exercise session.  Pre-exercise vitals: . Weight kg: 80.7 . Liters of O2: ra . SpO2: 95 . HR: 71 . BP: 112/60 . CBG: na  Exercise vitals: . Highest heartrate:  94 . Lowest oxygen saturation: 96 . Highest blood pressure: 140/70 . Liters of 02: ra  Post-exercise vitals: . SpO2: 96 . HR: 77 . BP: 124/60 . Liters of O2: ra . CBG: na  Dr. Brand Males, Medical Director Dr. Algis Liming is immediately available during today's Pulmonary Rehab session for [facility] on 06/13/2014 at 1230 class time.

## 2014-06-18 ENCOUNTER — Encounter (HOSPITAL_COMMUNITY)
Admission: RE | Admit: 2014-06-18 | Discharge: 2014-06-18 | Disposition: A | Discharge status: Home / Self Care | Payer: Worker's Compensation | Source: Ambulatory Visit | Attending: Internal Medicine | Admitting: Internal Medicine

## 2014-06-18 DIAGNOSIS — Z5189 Encounter for other specified aftercare: Secondary | ICD-10-CM | POA: Diagnosis not present

## 2014-06-18 NOTE — Progress Notes (Signed)
Today, Holly Bowen exercised at [employer]. Service time was from 1330 to 1500.  The patient exercised for more than 31 minutes performing aerobic, strengthening, and stretching exercises. Oxygen saturation, heart rate, blood pressure, rate of perceived exertion, and shortness of breath were all monitored before, during, and after exercise. Holly Bowen presented with no problems at today's exercise session.   There was one workload change during today's exercise session.  Pre-exercise vitals: . Weight kg: 81.5 . Liters of O2: ra . SpO2: 96 . HR: 73 . BP: 150/80 . CBG: na  Exercise vitals: . Highest heartrate:  102 . Lowest oxygen saturation: 96 . Highest blood pressure: 152/64 . Liters of 02: ra  Post-exercise vitals: . SpO2: 96 . HR: 75 . BP: 106/60 . Liters of O2: ra . CBG: na Dr. Brand Males, Medical Director Dr. Algis Liming is immediately available during today's Pulmonary Rehab session for [facility] on 06/18/2014 at 1330 class time.  Marland Kitchen

## 2014-06-20 ENCOUNTER — Encounter (HOSPITAL_COMMUNITY)
Admission: RE | Admit: 2014-06-20 | Discharge: 2014-06-20 | Disposition: A | Discharge status: Home / Self Care | Payer: Worker's Compensation | Source: Ambulatory Visit | Attending: Internal Medicine | Admitting: Internal Medicine

## 2014-06-20 DIAGNOSIS — Z5189 Encounter for other specified aftercare: Secondary | ICD-10-CM | POA: Diagnosis not present

## 2014-06-20 NOTE — Progress Notes (Signed)
Today, Shajuan exercised at [employer]. Service time was from 1330 to 1530.  The patient exercised for more than 31 minutes performing aerobic, strengthening, and stretching exercises. Oxygen saturation, heart rate, blood pressure, rate of perceived exertion, and shortness of breath were all monitored before, during, and after exercise. Chantrice presented with no problems at today's exercise session. Amali also attended an education session on oxygen use and therapy.  There was no workload change during today's exercise session.  Pre-exercise vitals: . Weight kg: 82.2 . Liters of O2: ra . SpO2: 96 . HR: 75 . BP: 126/70 . CBG: na  Exercise vitals: . Highest heartrate:  96 . Lowest oxygen saturation: 96 . Highest blood pressure: 162/84 . Liters of 02: ra  Post-exercise vitals: . SpO2: 96 . HR: 71 . BP: 112/74 . Liters of O2: ra . CBG: na  Dr. Brand Males, Medical Director Dr. Daleen Bo is immediately available during today's Pulmonary Rehab session for [facility] on 06/20/2014 at 1330 class time.

## 2014-06-25 ENCOUNTER — Encounter (HOSPITAL_COMMUNITY): Payer: Worker's Compensation

## 2014-06-27 ENCOUNTER — Encounter (HOSPITAL_COMMUNITY)
Admission: RE | Admit: 2014-06-27 | Discharge: 2014-06-27 | Disposition: A | Discharge status: Home / Self Care | Payer: Worker's Compensation | Source: Ambulatory Visit | Attending: Internal Medicine | Admitting: Internal Medicine

## 2014-06-27 DIAGNOSIS — Z5189 Encounter for other specified aftercare: Secondary | ICD-10-CM | POA: Diagnosis not present

## 2014-06-27 NOTE — Progress Notes (Signed)
Today, Correna exercised at [employer]. Service time was from 1:30p to 3:20p.  The patient exercised for more than 31 minutes performing aerobic, strengthening, and stretching exercises. Oxygen saturation, heart rate, blood pressure, rate of perceived exertion, and shortness of breath were all monitored before, during, and after exercise. Denetta presented with no problems at today's exercise session. The patient attended education with Jeanella Craze on [employer].   There was an increase in workload change during today's exercise session.  Pre-exercise vitals: . Weight kg: 80.4 . Liters of O2: ra . SpO2: 93 . HR: 67 . BP: 138/62 . CBG: na  Exercise vitals: . Highest heartrate:  102 . Lowest oxygen saturation: 95 . Highest blood pressure: 134/66 . Liters of 02: ra  Post-exercise vitals: . SpO2: 96 . HR: 77 . BP: 122/62 . Liters of O2: ra . CBG: na  Dr. Brand Males, Medical Director Dr. Tana Coast is immediately available during today's Pulmonary Rehab session for [facility] on 06/27/14 at 1:30p class time.

## 2014-07-02 ENCOUNTER — Encounter (HOSPITAL_COMMUNITY)
Admission: RE | Admit: 2014-07-02 | Discharge: 2014-07-02 | Disposition: A | Discharge status: Home / Self Care | Payer: Worker's Compensation | Source: Ambulatory Visit | Attending: Internal Medicine | Admitting: Internal Medicine

## 2014-07-02 DIAGNOSIS — R0602 Shortness of breath: Secondary | ICD-10-CM | POA: Insufficient documentation

## 2014-07-02 DIAGNOSIS — Z5189 Encounter for other specified aftercare: Secondary | ICD-10-CM | POA: Insufficient documentation

## 2014-07-02 NOTE — Progress Notes (Signed)
Today, Holly Bowen exercised at [employer]. Service time was from 1330 to 1500.  The patient exercised for more than 31 minutes performing aerobic, strengthening, and stretching exercises. Oxygen saturation, heart rate, blood pressure, rate of perceived exertion, and shortness of breath were all monitored before, during, and after exercise. Holly Bowen presented with no problems at today's exercise session.   There was no workload change during today's exercise session.  Pre-exercise vitals: . Weight kg: 81.6 . Liters of O2: ra . SpO2: 96 . HR: 67 . BP: 118/68 . CBG: na  Exercise vitals: . Highest heartrate:  91 . Lowest oxygen saturation: 96 . Highest blood pressure: 120/ . Liters of 02: ra  Post-exercise vitals: . SpO2: 94 . HR: 69 . BP: 122/60 . Liters of O2: ra . CBG: na  Dr. Brand Bowen, Medical Director Dr. Tana Bowen is immediately available during today's Pulmonary Rehab session for [name] on 07/02/2014 at 1330 class time.

## 2014-07-04 ENCOUNTER — Encounter (HOSPITAL_COMMUNITY)
Admission: RE | Admit: 2014-07-04 | Discharge: 2014-07-04 | Disposition: A | Discharge status: Home / Self Care | Payer: Worker's Compensation | Source: Ambulatory Visit | Attending: Internal Medicine | Admitting: Internal Medicine

## 2014-07-04 DIAGNOSIS — Z5189 Encounter for other specified aftercare: Secondary | ICD-10-CM | POA: Diagnosis not present

## 2014-07-04 DIAGNOSIS — R0602 Shortness of breath: Secondary | ICD-10-CM | POA: Diagnosis not present

## 2014-07-04 NOTE — Progress Notes (Signed)
Today, Aneth exercised at [employer]. Service time was from 1330 to 1515.  The patient exercised for more than 31 minutes performing aerobic, strengthening, and stretching exercises. Oxygen saturation, heart rate, blood pressure, rate of perceived exertion, and shortness of breath were all monitored before, during, and after exercise. Loreena presented with no problems at today's exercise session. Reilly also attended an education session with a respiratory therapist.  There was no workload change during today's exercise session.  Pre-exercise vitals: . Weight kg: 82.4 . Liters of O2: ra . SpO2: 96 . HR: 76 . BP: 130/60 . CBG: na  Exercise vitals: . Highest heartrate:  94 . Lowest oxygen saturation: 95 . Highest blood pressure: 150/72 . Liters of 02: ra  Post-exercise vitals: . SpO2: 95 . HR: 80 . BP: 106/70 . Liters of O2: ra . CBG: na  Dr. Brand Males, Medical Director Dr. Wyline Copas is immediately available during today's Pulmonary Rehab session for [facility] on 07/04/2014 at 1330 class time.

## 2014-07-09 ENCOUNTER — Encounter (HOSPITAL_COMMUNITY)
Admission: RE | Admit: 2014-07-09 | Discharge: 2014-07-09 | Disposition: A | Discharge status: Home / Self Care | Payer: Worker's Compensation | Source: Ambulatory Visit | Attending: Internal Medicine | Admitting: Internal Medicine

## 2014-07-09 DIAGNOSIS — Z5189 Encounter for other specified aftercare: Secondary | ICD-10-CM | POA: Diagnosis not present

## 2014-07-09 NOTE — Progress Notes (Signed)
Today, Holly Bowen exercised at [employer]. Service time was from 1330 to 1515.  The patient exercised for more than 31 minutes performing aerobic, strengthening, and stretching exercises. Oxygen saturation, heart rate, blood pressure, rate of perceived exertion, and shortness of breath were all monitored before, during, and after exercise. Holly Bowen presented with no problems at today's exercise session.   There was no workload change during today's exercise session.  Pre-exercise vitals: . Weight kg: 39.6 . Liters of O2: ra . SpO2: 95 . HR: 86 . BP: 122/62 . CBG: na  Exercise vitals: . Highest heartrate:  128 . Lowest oxygen saturation: 91 . Highest blood pressure: 160/66 . Liters of 02: 2  Post-exercise vitals: . SpO2: 96 . HR: 91 . BP: 104/62 . Liters of O2: ra . CBG: na Dr. Brand Males, Medical Director Dr. Wyline Copas is immediately available during today's Pulmonary Rehab session for [facility] on 07/09/2014 at 1330 class time.  Marland Kitchen

## 2014-07-09 NOTE — Progress Notes (Signed)
Today, Molleigh exercised at [employer]. Service time was from 1330 to 1515.  The patient exercised for more than 31 minutes performing aerobic, strengthening, and stretching exercises. Oxygen saturation, heart rate, blood pressure, rate of perceived exertion, and shortness of breath were all monitored before, during, and after exercise. Cymone presented with no problems at today's exercise session.   There was one workload change during today's exercise session.  Pre-exercise vitals: . Weight kg: 80.8 . Liters of O2: ra . SpO2: 95 . HR: 70 . BP: 132/76 . CBG: na  Exercise vitals: . Highest heartrate:  98 . Lowest oxygen saturation: 96 . Highest blood pressure: 132/70 . Liters of 02: ra  Post-exercise vitals: . SpO2: 93 . HR: 72 . BP: 112/64 . Liters of O2: ra . CBG: na Dr. Brand Males, Medical Director Dr. Wyline Copas is immediately available during today's Pulmonary Rehab session for [facility] on 07/09/2014 at 1330 class time.

## 2014-07-11 ENCOUNTER — Encounter (HOSPITAL_COMMUNITY)
Admission: RE | Admit: 2014-07-11 | Discharge: 2014-07-11 | Disposition: A | Discharge status: Home / Self Care | Payer: Worker's Compensation | Source: Ambulatory Visit | Attending: Internal Medicine | Admitting: Internal Medicine

## 2014-07-11 DIAGNOSIS — Z5189 Encounter for other specified aftercare: Secondary | ICD-10-CM | POA: Diagnosis not present

## 2014-07-11 NOTE — Progress Notes (Signed)
Today, [name] exercised at [employer]. Service time was from 1:30p to 3:45p.  The patient exercised for more than 31 minutes performing aerobic, strengthening, and stretching exercises. Oxygen saturation, heart rate, blood pressure, rate of perceived exertion, and shortness of breath were all monitored before, during, and after exercise. Holly Bowen presented with no problems at today's exercise session. The patient attended education today with [place].  There was no workload change during today's exercise session.  Pre-exercise vitals: . Weight kg: 81.0 . Liters of O2: ra . SpO2: 95 . HR: 67 . BP: 122/70 . CBG: na  Exercise vitals: . Highest heartrate:  94 . Lowest oxygen saturation: 96 . Highest blood pressure: 180/90 . Liters of 02: ra  Post-exercise vitals: . SpO2: 94 . HR: 81 . BP: 142/90 . Liters of O2: ra . CBG: na  Dr. Brand Males, Medical Director Dr. Candiss Norse is immediately available during today's Pulmonary Rehab session for [facility] on 07/11/14 at 1:30p class time.

## 2014-07-16 ENCOUNTER — Encounter (HOSPITAL_COMMUNITY)
Admission: RE | Admit: 2014-07-16 | Discharge: 2014-07-16 | Disposition: A | Discharge status: Home / Self Care | Payer: Worker's Compensation | Source: Ambulatory Visit | Attending: Internal Medicine | Admitting: Internal Medicine

## 2014-07-16 DIAGNOSIS — Z5189 Encounter for other specified aftercare: Secondary | ICD-10-CM | POA: Diagnosis not present

## 2014-07-16 NOTE — Progress Notes (Signed)
Today, Bliss exercised at [employer]. Service time was from 1330 to 1500.  The patient exercised for more than 31 minutes performing aerobic, strengthening, and stretching exercises. Oxygen saturation, heart rate, blood pressure, rate of perceived exertion, and shortness of breath were all monitored before, during, and after exercise. Nyimah presented with no problems at today's exercise session.   There was a workload change during today's exercise session.  Pre-exercise vitals: . Weight kg: 80.8 . Liters of O2: ra . SpO2: 95 . HR: 62 . BP: 110/60 . CBG: na  Exercise vitals: . Highest heartrate:  92 . Lowest oxygen saturation: 95 . Highest blood pressure: 122/62 . Liters of 02: ra  Post-exercise vitals: . SpO2: 95 . HR: 66 . BP: 108/68 . Liters of O2: ra . CBG: na  Dr. Brand Males, Medical Director Dr. Candiss Norse is immediately available during today's Pulmonary Rehab session for [facility] on 07/16/2014 at 1330 class time.

## 2014-07-18 ENCOUNTER — Encounter (HOSPITAL_COMMUNITY)
Admission: RE | Admit: 2014-07-18 | Discharge: 2014-07-18 | Disposition: A | Discharge status: Home / Self Care | Payer: Worker's Compensation | Source: Ambulatory Visit | Attending: Internal Medicine | Admitting: Internal Medicine

## 2014-07-18 DIAGNOSIS — Z5189 Encounter for other specified aftercare: Secondary | ICD-10-CM | POA: Diagnosis not present

## 2014-07-18 NOTE — Progress Notes (Signed)
Today, Holly Bowen exercised at [employer]. Service time was from 1330 to 1515.  The patient exercised for more than 31 minutes performing aerobic, strengthening, and stretching exercises. Oxygen saturation, heart rate, blood pressure, rate of perceived exertion, and shortness of breath were all monitored before, during, and after exercise. Holly Bowen presented with no problems at today's exercise session. Holly Bowen also attended an education session on exercise for the pulmonary patient.  There was no workload change during today's exercise session.  Pre-exercise vitals: . Weight kg: 81.0 . Liters of O2: ra . SpO2: 95 . HR: 63 . BP: 104/70 . CBG: na  Exercise vitals: . Highest heartrate:  94 . Lowest oxygen saturation: 96 . Highest blood pressure: 132/64 . Liters of 02: ra  Post-exercise vitals: . SpO2: 96 . HR: 70 . BP: 114/60 . Liters of O2: ra . CBG: na  Dr. Brand Males, Medical Director Dr. Candiss Norse is immediately available during today's Pulmonary Rehab session for [name] on 07/18/2014 at 1330 class time.

## 2014-07-23 ENCOUNTER — Encounter (HOSPITAL_COMMUNITY)
Admission: RE | Admit: 2014-07-23 | Discharge: 2014-07-23 | Disposition: A | Discharge status: Home / Self Care | Payer: Worker's Compensation | Source: Ambulatory Visit | Attending: Internal Medicine | Admitting: Internal Medicine

## 2014-07-23 DIAGNOSIS — Z5189 Encounter for other specified aftercare: Secondary | ICD-10-CM | POA: Diagnosis not present

## 2014-07-23 NOTE — Progress Notes (Signed)
Today, Holly Bowen exercised at [employer]. Service time was from 1330 to 1500.  The patient exercised for more than 31 minutes performing aerobic, strengthening, and stretching exercises. Oxygen saturation, heart rate, blood pressure, rate of perceived exertion, and shortness of breath were all monitored before, during, and after exercise. [name] presented with no problems at today's exercise session.   There was no workload change during today's exercise session.  Pre-exercise vitals: . Weight kg: 81.0 . Liters of O2: RA . SpO2: 96 . HR: 66 . BP: 130/70 . CBG: na  Exercise vitals: . Highest heartrate:  92 . Lowest oxygen saturation: 97 . Highest blood pressure: 136/70 . Liters of 02: ra  Post-exercise vitals: . SpO2: 97 . HR: 67 . BP: 112/62 . Liters of O2: RA . CBG: NA Dr. Brand Males, Medical Director Dr. Coralyn Pear is immediately available during today's Pulmonary Rehab session for [facility] on 07/23/2014 at 1330 class time.  Marland Kitchen

## 2014-07-25 ENCOUNTER — Encounter (HOSPITAL_COMMUNITY)
Admission: RE | Admit: 2014-07-25 | Discharge: 2014-07-25 | Disposition: A | Discharge status: Home / Self Care | Payer: Worker's Compensation | Source: Ambulatory Visit | Attending: Internal Medicine | Admitting: Internal Medicine

## 2014-07-25 DIAGNOSIS — Z5189 Encounter for other specified aftercare: Secondary | ICD-10-CM | POA: Diagnosis not present

## 2014-07-25 NOTE — Progress Notes (Signed)
Today, Dearia exercised at [employer]. Service time was from 1330 to 1530.  The patient exercised for more than 31 minutes performing aerobic, strengthening, and stretching exercises. Oxygen saturation, heart rate, blood pressure, rate of perceived exertion, and shortness of breath were all monitored before, during, and after exercise. Tahisha presented with no problems at today's exercise session. Patient attended the Sign and Symptoms class today.  There was one workload change during today's exercise session.  Pre-exercise vitals: . Weight kg: 81.4 . Liters of O2: ra . SpO2: 96   . HR: 70 . BP: 110/82 . CBG: na  Exercise vitals: . Highest heartrate:  87 . Lowest oxygen saturation: 97 . Highest blood pressure: 140/88 . Liters of 02: ra  Post-exercise vitals: . SpO2: 96 . HR: 69 . BP: 124/76 . Liters of O2: ra . CBG: na Dr. Brand Males, Medical Director Dr. Candiss Norse is immediately available during today's Pulmonary Rehab session for [facility] on 07/25/2014 at 1330 class time.  Marland Kitchen

## 2014-07-30 ENCOUNTER — Encounter (HOSPITAL_COMMUNITY): Payer: Worker's Compensation

## 2014-08-01 ENCOUNTER — Encounter (HOSPITAL_COMMUNITY)
Admission: RE | Admit: 2014-08-01 | Discharge: 2014-08-01 | Disposition: A | Discharge status: Home / Self Care | Payer: Worker's Compensation | Source: Ambulatory Visit | Attending: Internal Medicine | Admitting: Internal Medicine

## 2014-08-01 DIAGNOSIS — R0602 Shortness of breath: Secondary | ICD-10-CM | POA: Diagnosis not present

## 2014-08-01 DIAGNOSIS — Z5189 Encounter for other specified aftercare: Secondary | ICD-10-CM | POA: Insufficient documentation

## 2014-08-01 NOTE — Progress Notes (Signed)
Today, [name] exercised at [employer]. Service time was from 1330 to 1530.  The patient exercised for more than 31 minutes performing aerobic, strengthening, and stretching exercises. Oxygen saturation, heart rate, blood pressure, rate of perceived exertion, and shortness of breath were all monitored before, during, and after exercise. [name] presented with no problems at today's exercise session. [name] also attended an education session on anatomy and physiology of the respiratory system.  There was no workload change during today's exercise session.  Pre-exercise vitals: . Weight kg: 82.0 . Liters of O2: ra . SpO2: 97 . HR: 73 . BP: 114/68 . CBG: na  Exercise vitals: . Highest heartrate:  81 . Lowest oxygen saturation: 97 . Highest blood pressure: 122/64 . Liters of 02: ra  Post-exercise vitals: . SpO2: 96 . HR: 65 . BP: 112/70 . Liters of O2: ra . CBG: na  Dr. Brand Males, Medical Director Dr. Waldron Labs is immediately available during today's Pulmonary Rehab session for [facility] on 08/01/2014 at 1330 class time.

## 2014-08-06 ENCOUNTER — Encounter (HOSPITAL_COMMUNITY)
Admission: RE | Admit: 2014-08-06 | Discharge: 2014-08-06 | Disposition: A | Discharge status: Home / Self Care | Payer: Worker's Compensation | Source: Ambulatory Visit | Attending: Internal Medicine | Admitting: Internal Medicine

## 2014-08-06 DIAGNOSIS — Z5189 Encounter for other specified aftercare: Secondary | ICD-10-CM | POA: Diagnosis not present

## 2014-08-06 NOTE — Progress Notes (Signed)
Today, Holly Bowen exercised at [employer]. Service time was from 1330 to 1500.  The patient exercised for more than 31 minutes performing aerobic, strengthening, and stretching exercises. Oxygen saturation, heart rate, blood pressure, rate of perceived exertion, and shortness of breath were all monitored before, during, and after exercise. Holly Bowen presented with no problems at today's exercise session.   There was no workload change during today's exercise session.  Pre-exercise vitals: . Weight kg: 81.4 . Liters of O2: ra . SpO2: 96 . HR: 66 . BP: 130/78 . CBG: na  Exercise vitals: . Highest heartrate:  99 . Lowest oxygen saturation: 95 . Highest blood pressure: 116/64 . Liters of 02: ra  Post-exercise vitals: . SpO2: 95 . HR: 65 . BP: 96/60, recheck 110/64 . Liters of O2: ra . CBG: na  Dr. Brand Males, Medical Director Dr. Waldron Labs is immediately available during today's Pulmonary Rehab session for [name] on 08/06/2014 at 1330 class time.

## 2014-08-08 ENCOUNTER — Encounter (HOSPITAL_COMMUNITY)
Admission: RE | Admit: 2014-08-08 | Discharge: 2014-08-08 | Disposition: A | Discharge status: Home / Self Care | Payer: Worker's Compensation | Source: Ambulatory Visit | Attending: Internal Medicine | Admitting: Internal Medicine

## 2014-08-08 DIAGNOSIS — Z5189 Encounter for other specified aftercare: Secondary | ICD-10-CM | POA: Diagnosis not present

## 2014-08-08 NOTE — Progress Notes (Signed)
Holly Bowen completed a Six-Minute Walk Test on 08/08/14 . Holly Bowen walked 1248 feet with 0 breaks.  The patient's lowest oxygen saturation was 95 , highest heart rate was 86 , and highest blood pressure was 140/80. The patient was on room air. Holly Bowen stated that nothing hindered their walk test.

## 2014-08-13 ENCOUNTER — Encounter (HOSPITAL_COMMUNITY): Admission: RE | Admit: 2014-08-13 | Payer: Worker's Compensation | Source: Ambulatory Visit

## 2014-08-13 NOTE — Progress Notes (Signed)
Discharge Note from Pulmonary  Rehabilitation   [place], "Holly Bowen" has graduated from our program.  She was a pleasure to have in the program.  She made great progress in her strength and endurance.  She was an asset to the program in that her sense of humor was liked by most of the patients and staff.  Her attendance was very regular and she gave it her all.  She is going to exercise at home for now.  She met her goals which were to learn how to manage her shortness of breath and to increase her activity tolerance in regards to her shortness of breath.

## 2014-08-15 ENCOUNTER — Encounter (HOSPITAL_COMMUNITY): Payer: Worker's Compensation

## 2014-08-20 ENCOUNTER — Encounter (HOSPITAL_COMMUNITY): Payer: Worker's Compensation

## 2014-08-22 ENCOUNTER — Encounter (HOSPITAL_COMMUNITY): Payer: Worker's Compensation

## 2014-08-27 ENCOUNTER — Encounter (HOSPITAL_COMMUNITY): Payer: Worker's Compensation

## 2014-08-29 ENCOUNTER — Encounter (HOSPITAL_COMMUNITY): Payer: Worker's Compensation

## 2014-09-10 ENCOUNTER — Encounter (HOSPITAL_COMMUNITY)
Admission: RE | Admit: 2014-09-10 | Discharge: 2014-09-10 | Disposition: A | Discharge status: Home / Self Care | Payer: Worker's Compensation | Source: Ambulatory Visit | Attending: Internal Medicine | Admitting: Internal Medicine

## 2014-09-10 DIAGNOSIS — Z5189 Encounter for other specified aftercare: Secondary | ICD-10-CM | POA: Diagnosis not present

## 2014-09-10 DIAGNOSIS — R0602 Shortness of breath: Secondary | ICD-10-CM | POA: Diagnosis not present

## 2014-09-12 ENCOUNTER — Encounter (HOSPITAL_COMMUNITY)
Admission: RE | Admit: 2014-09-12 | Discharge: 2014-09-12 | Disposition: A | Discharge status: Home / Self Care | Payer: Worker's Compensation | Source: Ambulatory Visit | Attending: Internal Medicine | Admitting: Internal Medicine

## 2014-09-17 ENCOUNTER — Encounter (HOSPITAL_COMMUNITY)
Admission: RE | Admit: 2014-09-17 | Discharge: 2014-09-17 | Disposition: A | Discharge status: Home / Self Care | Payer: Worker's Compensation | Source: Ambulatory Visit | Attending: Internal Medicine | Admitting: Internal Medicine

## 2014-09-19 ENCOUNTER — Encounter (HOSPITAL_COMMUNITY): Payer: Worker's Compensation

## 2014-09-24 ENCOUNTER — Encounter (HOSPITAL_COMMUNITY)
Admission: RE | Admit: 2014-09-24 | Discharge: 2014-09-24 | Disposition: A | Discharge status: Home / Self Care | Payer: Worker's Compensation | Source: Ambulatory Visit | Attending: Internal Medicine | Admitting: Internal Medicine

## 2014-09-26 ENCOUNTER — Encounter (HOSPITAL_COMMUNITY): Payer: Worker's Compensation

## 2014-10-01 ENCOUNTER — Encounter (HOSPITAL_COMMUNITY)
Admission: RE | Admit: 2014-10-01 | Discharge: 2014-10-01 | Disposition: A | Discharge status: Home / Self Care | Payer: Worker's Compensation | Source: Ambulatory Visit | Attending: Internal Medicine | Admitting: Internal Medicine

## 2014-10-01 DIAGNOSIS — Z5189 Encounter for other specified aftercare: Secondary | ICD-10-CM | POA: Diagnosis present

## 2014-10-01 DIAGNOSIS — R0602 Shortness of breath: Secondary | ICD-10-CM | POA: Insufficient documentation

## 2014-10-03 ENCOUNTER — Encounter (HOSPITAL_COMMUNITY): Payer: Worker's Compensation

## 2014-10-08 ENCOUNTER — Encounter (HOSPITAL_COMMUNITY)
Admission: RE | Admit: 2014-10-08 | Discharge: 2014-10-08 | Disposition: A | Discharge status: Home / Self Care | Payer: Worker's Compensation | Source: Ambulatory Visit | Attending: Internal Medicine | Admitting: Internal Medicine

## 2014-10-10 ENCOUNTER — Encounter (HOSPITAL_COMMUNITY)
Admission: RE | Admit: 2014-10-10 | Discharge: 2014-10-10 | Disposition: A | Discharge status: Home / Self Care | Payer: Worker's Compensation | Source: Ambulatory Visit | Attending: Internal Medicine | Admitting: Internal Medicine

## 2014-10-15 ENCOUNTER — Encounter (HOSPITAL_COMMUNITY)
Admission: RE | Admit: 2014-10-15 | Discharge: 2014-10-15 | Disposition: A | Discharge status: Home / Self Care | Payer: Worker's Compensation | Source: Ambulatory Visit | Attending: Internal Medicine | Admitting: Internal Medicine

## 2014-10-17 ENCOUNTER — Encounter (HOSPITAL_COMMUNITY)
Admission: RE | Admit: 2014-10-17 | Discharge: 2014-10-17 | Disposition: A | Discharge status: Home / Self Care | Payer: Worker's Compensation | Source: Ambulatory Visit | Attending: Internal Medicine | Admitting: Internal Medicine

## 2014-10-22 ENCOUNTER — Encounter (HOSPITAL_COMMUNITY)
Admission: RE | Admit: 2014-10-22 | Discharge: 2014-10-22 | Disposition: A | Discharge status: Home / Self Care | Payer: Worker's Compensation | Source: Ambulatory Visit | Attending: Internal Medicine | Admitting: Internal Medicine

## 2014-10-24 ENCOUNTER — Encounter (HOSPITAL_COMMUNITY): Admission: RE | Admit: 2014-10-24 | Payer: Worker's Compensation | Source: Ambulatory Visit

## 2014-10-24 ENCOUNTER — Telehealth (HOSPITAL_COMMUNITY): Payer: Self-pay | Admitting: *Deleted

## 2014-10-29 ENCOUNTER — Encounter (HOSPITAL_COMMUNITY)
Admission: RE | Admit: 2014-10-29 | Discharge: 2014-10-29 | Disposition: A | Discharge status: Home / Self Care | Payer: Worker's Compensation | Source: Ambulatory Visit | Attending: Internal Medicine | Admitting: Internal Medicine

## 2014-10-31 ENCOUNTER — Encounter (HOSPITAL_COMMUNITY): Payer: Worker's Compensation

## 2014-10-31 DIAGNOSIS — Z5189 Encounter for other specified aftercare: Secondary | ICD-10-CM | POA: Insufficient documentation

## 2014-10-31 DIAGNOSIS — R0602 Shortness of breath: Secondary | ICD-10-CM | POA: Insufficient documentation

## 2014-11-05 ENCOUNTER — Encounter (HOSPITAL_COMMUNITY)
Admission: RE | Admit: 2014-11-05 | Discharge: 2014-11-05 | Disposition: A | Discharge status: Home / Self Care | Payer: Self-pay | Source: Ambulatory Visit | Attending: Internal Medicine | Admitting: Internal Medicine

## 2014-11-07 ENCOUNTER — Encounter (HOSPITAL_COMMUNITY)
Admission: RE | Admit: 2014-11-07 | Discharge: 2014-11-07 | Disposition: A | Discharge status: Home / Self Care | Payer: Self-pay | Source: Ambulatory Visit | Attending: Internal Medicine | Admitting: Internal Medicine

## 2014-11-12 ENCOUNTER — Encounter (HOSPITAL_COMMUNITY)
Admission: RE | Admit: 2014-11-12 | Discharge: 2014-11-12 | Disposition: A | Discharge status: Home / Self Care | Payer: Self-pay | Source: Ambulatory Visit | Attending: Internal Medicine | Admitting: Internal Medicine

## 2014-11-14 ENCOUNTER — Encounter (HOSPITAL_COMMUNITY): Payer: Self-pay

## 2014-11-19 ENCOUNTER — Encounter (HOSPITAL_COMMUNITY): Payer: Self-pay

## 2014-11-21 ENCOUNTER — Encounter (HOSPITAL_COMMUNITY)
Admission: RE | Admit: 2014-11-21 | Discharge: 2014-11-21 | Disposition: A | Discharge status: Home / Self Care | Payer: Self-pay | Source: Ambulatory Visit | Attending: Internal Medicine | Admitting: Internal Medicine

## 2014-11-26 ENCOUNTER — Encounter (HOSPITAL_COMMUNITY)
Admission: RE | Admit: 2014-11-26 | Discharge: 2014-11-26 | Disposition: A | Discharge status: Home / Self Care | Payer: Self-pay | Source: Ambulatory Visit | Attending: Internal Medicine | Admitting: Internal Medicine

## 2014-11-28 ENCOUNTER — Encounter (HOSPITAL_COMMUNITY)
Admission: RE | Admit: 2014-11-28 | Discharge: 2014-11-28 | Disposition: A | Discharge status: Home / Self Care | Payer: Self-pay | Source: Ambulatory Visit | Attending: Internal Medicine | Admitting: Internal Medicine

## 2014-12-03 ENCOUNTER — Encounter (HOSPITAL_COMMUNITY)
Admission: RE | Admit: 2014-12-03 | Discharge: 2014-12-03 | Disposition: A | Discharge status: Home / Self Care | Payer: Worker's Compensation | Source: Ambulatory Visit | Attending: Internal Medicine | Admitting: Internal Medicine

## 2014-12-03 DIAGNOSIS — Z5189 Encounter for other specified aftercare: Secondary | ICD-10-CM | POA: Insufficient documentation

## 2014-12-03 DIAGNOSIS — R0602 Shortness of breath: Secondary | ICD-10-CM | POA: Insufficient documentation

## 2014-12-05 ENCOUNTER — Encounter (HOSPITAL_COMMUNITY)
Admission: RE | Admit: 2014-12-05 | Discharge: 2014-12-05 | Disposition: A | Discharge status: Home / Self Care | Payer: Worker's Compensation | Source: Ambulatory Visit | Attending: Internal Medicine | Admitting: Internal Medicine

## 2014-12-10 ENCOUNTER — Encounter (HOSPITAL_COMMUNITY)
Admission: RE | Admit: 2014-12-10 | Discharge: 2014-12-10 | Disposition: A | Discharge status: Home / Self Care | Payer: Worker's Compensation | Source: Ambulatory Visit | Attending: Internal Medicine | Admitting: Internal Medicine

## 2014-12-12 ENCOUNTER — Encounter (HOSPITAL_COMMUNITY): Payer: Worker's Compensation

## 2014-12-17 ENCOUNTER — Telehealth (HOSPITAL_COMMUNITY): Payer: Self-pay | Admitting: *Deleted

## 2014-12-17 ENCOUNTER — Encounter (HOSPITAL_COMMUNITY)
Admission: RE | Admit: 2014-12-17 | Discharge: 2014-12-17 | Disposition: A | Discharge status: Home / Self Care | Payer: Worker's Compensation | Source: Ambulatory Visit | Attending: Internal Medicine | Admitting: Internal Medicine

## 2014-12-19 ENCOUNTER — Encounter (HOSPITAL_COMMUNITY)
Admission: RE | Admit: 2014-12-19 | Discharge: 2014-12-19 | Disposition: A | Discharge status: Home / Self Care | Payer: Worker's Compensation | Source: Ambulatory Visit | Attending: Internal Medicine | Admitting: Internal Medicine

## 2014-12-24 ENCOUNTER — Encounter (HOSPITAL_COMMUNITY): Payer: Worker's Compensation

## 2014-12-26 ENCOUNTER — Encounter (HOSPITAL_COMMUNITY)
Admission: RE | Admit: 2014-12-26 | Discharge: 2014-12-26 | Disposition: A | Discharge status: Home / Self Care | Payer: Worker's Compensation | Source: Ambulatory Visit | Attending: Internal Medicine | Admitting: Internal Medicine

## 2014-12-31 ENCOUNTER — Encounter (HOSPITAL_COMMUNITY): Payer: Self-pay

## 2014-12-31 DIAGNOSIS — R0602 Shortness of breath: Secondary | ICD-10-CM | POA: Insufficient documentation

## 2014-12-31 DIAGNOSIS — Z5189 Encounter for other specified aftercare: Secondary | ICD-10-CM | POA: Insufficient documentation

## 2015-01-02 ENCOUNTER — Encounter (HOSPITAL_COMMUNITY): Payer: Self-pay

## 2015-01-07 ENCOUNTER — Encounter (HOSPITAL_COMMUNITY)
Admission: RE | Admit: 2015-01-07 | Discharge: 2015-01-07 | Disposition: A | Discharge status: Home / Self Care | Payer: Self-pay | Source: Ambulatory Visit | Attending: Internal Medicine | Admitting: Internal Medicine

## 2015-01-09 ENCOUNTER — Encounter (HOSPITAL_COMMUNITY): Payer: Self-pay

## 2015-01-14 ENCOUNTER — Encounter (HOSPITAL_COMMUNITY)
Admission: RE | Admit: 2015-01-14 | Discharge: 2015-01-14 | Disposition: A | Discharge status: Home / Self Care | Payer: Self-pay | Source: Ambulatory Visit | Attending: Internal Medicine | Admitting: Internal Medicine

## 2015-01-16 ENCOUNTER — Encounter (HOSPITAL_COMMUNITY)
Admission: RE | Admit: 2015-01-16 | Discharge: 2015-01-16 | Disposition: A | Discharge status: Home / Self Care | Payer: Self-pay | Source: Ambulatory Visit | Attending: Internal Medicine | Admitting: Internal Medicine

## 2015-01-21 ENCOUNTER — Encounter (HOSPITAL_COMMUNITY): Payer: Self-pay

## 2015-01-23 ENCOUNTER — Encounter (HOSPITAL_COMMUNITY)
Admission: RE | Admit: 2015-01-23 | Discharge: 2015-01-23 | Disposition: A | Discharge status: Home / Self Care | Payer: Self-pay | Source: Ambulatory Visit | Attending: Internal Medicine | Admitting: Internal Medicine

## 2015-01-28 ENCOUNTER — Encounter (HOSPITAL_COMMUNITY)
Admission: RE | Admit: 2015-01-28 | Discharge: 2015-01-28 | Disposition: A | Discharge status: Home / Self Care | Payer: Self-pay | Source: Ambulatory Visit | Attending: Internal Medicine | Admitting: Internal Medicine

## 2015-01-30 ENCOUNTER — Encounter (HOSPITAL_COMMUNITY)
Admission: RE | Admit: 2015-01-30 | Discharge: 2015-01-30 | Disposition: A | Discharge status: Home / Self Care | Payer: Worker's Compensation | Source: Ambulatory Visit | Attending: Internal Medicine | Admitting: Internal Medicine

## 2015-01-30 DIAGNOSIS — Z5189 Encounter for other specified aftercare: Secondary | ICD-10-CM | POA: Insufficient documentation

## 2015-01-30 DIAGNOSIS — R0602 Shortness of breath: Secondary | ICD-10-CM | POA: Insufficient documentation

## 2015-02-04 ENCOUNTER — Encounter (HOSPITAL_COMMUNITY)
Admission: RE | Admit: 2015-02-04 | Discharge: 2015-02-04 | Disposition: A | Discharge status: Home / Self Care | Payer: Worker's Compensation | Source: Ambulatory Visit | Attending: Internal Medicine | Admitting: Internal Medicine

## 2015-02-04 NOTE — Progress Notes (Signed)
Pulmonary Rehab Note: Holly Bowen presents to pulmonary rehab for her bi-weekly exercise session complaining of palpitations. Her check in vitals were as follows: bp 130/64, hr 119, sao2 94 on RA. Upon further investigation Holly Bowen revealed that she has been out of her metoprolol 50mg  x 4 days. This is a BID medication. She states she has no more refills at the pharmacy and MD will not refill until she sees him on Thursday of this week. RN spoke with Langley Gauss at [facility] internal medicine clinic. She states Holly Bowen should have refills according to last physician note, but she was going to communicate with clinic RN ASAP. Holly Bowen will be notified today to pick up refill at pharmacy. Holly Bowen was also instructed not to exercise until she has taken her medication and educated on symptoms of sudden beta blocker withdrawal. She was discharged home without exercise, vitals as follows: bp 152/94, hr 124, sao2 96 on  RA. Holly Bowen did complain of anxiety and feeling "gittery" but feels it is situational. Will do a telephone follow-up with her later today.

## 2015-02-06 ENCOUNTER — Encounter (HOSPITAL_COMMUNITY): Payer: Worker's Compensation

## 2015-02-11 ENCOUNTER — Encounter (HOSPITAL_COMMUNITY)
Admission: RE | Admit: 2015-02-11 | Discharge: 2015-02-11 | Disposition: A | Discharge status: Home / Self Care | Payer: Worker's Compensation | Source: Ambulatory Visit | Attending: Internal Medicine | Admitting: Internal Medicine

## 2015-02-13 ENCOUNTER — Encounter (HOSPITAL_COMMUNITY)
Admission: RE | Admit: 2015-02-13 | Discharge: 2015-02-13 | Disposition: A | Discharge status: Home / Self Care | Payer: Worker's Compensation | Source: Ambulatory Visit | Attending: Internal Medicine | Admitting: Internal Medicine

## 2015-02-18 ENCOUNTER — Encounter (HOSPITAL_COMMUNITY)
Admission: RE | Admit: 2015-02-18 | Discharge: 2015-02-18 | Disposition: A | Discharge status: Home / Self Care | Payer: Worker's Compensation | Source: Ambulatory Visit | Attending: Internal Medicine | Admitting: Internal Medicine

## 2015-02-20 ENCOUNTER — Encounter (HOSPITAL_COMMUNITY)
Admission: RE | Admit: 2015-02-20 | Discharge: 2015-02-20 | Disposition: A | Discharge status: Home / Self Care | Payer: Worker's Compensation | Source: Ambulatory Visit | Attending: Internal Medicine | Admitting: Internal Medicine

## 2015-02-25 ENCOUNTER — Encounter (HOSPITAL_COMMUNITY)
Admission: RE | Admit: 2015-02-25 | Discharge: 2015-02-25 | Disposition: A | Discharge status: Home / Self Care | Payer: Worker's Compensation | Source: Ambulatory Visit | Attending: Internal Medicine | Admitting: Internal Medicine

## 2015-02-27 ENCOUNTER — Encounter (HOSPITAL_COMMUNITY)
Admission: RE | Admit: 2015-02-27 | Discharge: 2015-02-27 | Disposition: A | Discharge status: Home / Self Care | Payer: Worker's Compensation | Source: Ambulatory Visit | Attending: Internal Medicine | Admitting: Internal Medicine

## 2015-03-04 ENCOUNTER — Encounter (HOSPITAL_COMMUNITY)
Admission: RE | Admit: 2015-03-04 | Discharge: 2015-03-04 | Disposition: A | Discharge status: Home / Self Care | Payer: Worker's Compensation | Source: Ambulatory Visit | Attending: Internal Medicine | Admitting: Internal Medicine

## 2015-03-04 DIAGNOSIS — R0602 Shortness of breath: Secondary | ICD-10-CM | POA: Insufficient documentation

## 2015-03-04 DIAGNOSIS — Z5189 Encounter for other specified aftercare: Secondary | ICD-10-CM | POA: Insufficient documentation

## 2015-03-06 ENCOUNTER — Encounter (HOSPITAL_COMMUNITY)
Admission: RE | Admit: 2015-03-06 | Discharge: 2015-03-06 | Disposition: A | Discharge status: Home / Self Care | Payer: Worker's Compensation | Source: Ambulatory Visit | Attending: Internal Medicine | Admitting: Internal Medicine

## 2015-03-11 ENCOUNTER — Encounter (HOSPITAL_COMMUNITY)
Admission: RE | Admit: 2015-03-11 | Discharge: 2015-03-11 | Disposition: A | Discharge status: Home / Self Care | Payer: Worker's Compensation | Source: Ambulatory Visit | Attending: Internal Medicine | Admitting: Internal Medicine

## 2015-03-13 ENCOUNTER — Encounter (HOSPITAL_COMMUNITY)
Admission: RE | Admit: 2015-03-13 | Discharge: 2015-03-13 | Disposition: A | Discharge status: Home / Self Care | Payer: Worker's Compensation | Source: Ambulatory Visit | Attending: Internal Medicine | Admitting: Internal Medicine

## 2015-03-18 ENCOUNTER — Encounter (HOSPITAL_COMMUNITY)
Admission: RE | Admit: 2015-03-18 | Discharge: 2015-03-18 | Disposition: A | Discharge status: Home / Self Care | Payer: Worker's Compensation | Source: Ambulatory Visit | Attending: Internal Medicine | Admitting: Internal Medicine

## 2015-03-25 ENCOUNTER — Encounter (HOSPITAL_COMMUNITY)
Admission: RE | Admit: 2015-03-25 | Discharge: 2015-03-25 | Disposition: A | Discharge status: Home / Self Care | Payer: Worker's Compensation | Source: Ambulatory Visit | Attending: Internal Medicine | Admitting: Internal Medicine

## 2015-03-27 ENCOUNTER — Encounter (HOSPITAL_COMMUNITY): Payer: Self-pay | Attending: Internal Medicine

## 2015-03-27 DIAGNOSIS — R0602 Shortness of breath: Secondary | ICD-10-CM | POA: Insufficient documentation

## 2015-03-27 DIAGNOSIS — Z5189 Encounter for other specified aftercare: Secondary | ICD-10-CM | POA: Insufficient documentation

## 2015-04-01 ENCOUNTER — Encounter (HOSPITAL_COMMUNITY)
Admission: RE | Admit: 2015-04-01 | Discharge: 2015-04-01 | Disposition: A | Discharge status: Home / Self Care | Payer: Worker's Compensation | Source: Ambulatory Visit | Attending: Internal Medicine | Admitting: Internal Medicine

## 2015-04-01 DIAGNOSIS — Z5189 Encounter for other specified aftercare: Secondary | ICD-10-CM | POA: Insufficient documentation

## 2015-04-01 DIAGNOSIS — R0602 Shortness of breath: Secondary | ICD-10-CM | POA: Insufficient documentation

## 2015-04-03 ENCOUNTER — Encounter (HOSPITAL_COMMUNITY): Payer: Worker's Compensation

## 2015-04-08 ENCOUNTER — Encounter (HOSPITAL_COMMUNITY)
Admission: RE | Admit: 2015-04-08 | Discharge: 2015-04-08 | Disposition: A | Discharge status: Home / Self Care | Payer: Worker's Compensation | Source: Ambulatory Visit | Attending: Internal Medicine | Admitting: Internal Medicine

## 2015-04-10 ENCOUNTER — Encounter (HOSPITAL_COMMUNITY)
Admission: RE | Admit: 2015-04-10 | Discharge: 2015-04-10 | Disposition: A | Discharge status: Home / Self Care | Payer: Worker's Compensation | Source: Ambulatory Visit | Attending: Internal Medicine | Admitting: Internal Medicine

## 2015-04-15 ENCOUNTER — Encounter (HOSPITAL_COMMUNITY)
Admission: RE | Admit: 2015-04-15 | Discharge: 2015-04-15 | Disposition: A | Discharge status: Home / Self Care | Payer: Worker's Compensation | Source: Ambulatory Visit | Attending: Internal Medicine | Admitting: Internal Medicine

## 2015-04-17 ENCOUNTER — Encounter (HOSPITAL_COMMUNITY)
Admission: RE | Admit: 2015-04-17 | Discharge: 2015-04-17 | Disposition: A | Discharge status: Home / Self Care | Payer: Worker's Compensation | Source: Ambulatory Visit | Attending: Internal Medicine | Admitting: Internal Medicine

## 2015-04-22 ENCOUNTER — Encounter (HOSPITAL_COMMUNITY)
Admission: RE | Admit: 2015-04-22 | Discharge: 2015-04-22 | Disposition: A | Discharge status: Home / Self Care | Payer: Worker's Compensation | Source: Ambulatory Visit | Attending: Internal Medicine | Admitting: Internal Medicine

## 2015-04-24 ENCOUNTER — Encounter (HOSPITAL_COMMUNITY): Payer: Worker's Compensation

## 2015-04-29 ENCOUNTER — Encounter (HOSPITAL_COMMUNITY)
Admission: RE | Admit: 2015-04-29 | Discharge: 2015-04-29 | Disposition: A | Discharge status: Home / Self Care | Payer: Worker's Compensation | Source: Ambulatory Visit | Attending: Internal Medicine | Admitting: Internal Medicine

## 2015-05-01 ENCOUNTER — Encounter (HOSPITAL_COMMUNITY): Payer: Worker's Compensation

## 2015-05-01 DIAGNOSIS — R0602 Shortness of breath: Secondary | ICD-10-CM | POA: Insufficient documentation

## 2015-05-01 DIAGNOSIS — Z5189 Encounter for other specified aftercare: Secondary | ICD-10-CM | POA: Insufficient documentation

## 2015-05-06 ENCOUNTER — Encounter (HOSPITAL_COMMUNITY)
Admission: RE | Admit: 2015-05-06 | Discharge: 2015-05-06 | Disposition: A | Discharge status: Home / Self Care | Payer: Self-pay | Source: Ambulatory Visit | Attending: Internal Medicine | Admitting: Internal Medicine

## 2015-05-08 ENCOUNTER — Encounter (HOSPITAL_COMMUNITY)
Admission: RE | Admit: 2015-05-08 | Discharge: 2015-05-08 | Disposition: A | Discharge status: Home / Self Care | Payer: Self-pay | Source: Ambulatory Visit | Attending: Internal Medicine | Admitting: Internal Medicine

## 2015-05-13 ENCOUNTER — Encounter (HOSPITAL_COMMUNITY)
Admission: RE | Admit: 2015-05-13 | Discharge: 2015-05-13 | Disposition: A | Discharge status: Home / Self Care | Payer: Self-pay | Source: Ambulatory Visit | Attending: Internal Medicine | Admitting: Internal Medicine

## 2015-05-15 ENCOUNTER — Encounter (HOSPITAL_COMMUNITY)
Admission: RE | Admit: 2015-05-15 | Discharge: 2015-05-15 | Disposition: A | Discharge status: Home / Self Care | Payer: Self-pay | Source: Ambulatory Visit | Attending: Internal Medicine | Admitting: Internal Medicine

## 2015-05-20 ENCOUNTER — Encounter (HOSPITAL_COMMUNITY)
Admission: RE | Admit: 2015-05-20 | Discharge: 2015-05-20 | Disposition: A | Discharge status: Home / Self Care | Payer: Self-pay | Source: Ambulatory Visit | Attending: Internal Medicine | Admitting: Internal Medicine

## 2015-05-22 ENCOUNTER — Encounter (HOSPITAL_COMMUNITY)
Admission: RE | Admit: 2015-05-22 | Discharge: 2015-05-22 | Disposition: A | Discharge status: Home / Self Care | Payer: Self-pay | Source: Ambulatory Visit | Attending: Internal Medicine | Admitting: Internal Medicine

## 2015-05-27 ENCOUNTER — Encounter (HOSPITAL_COMMUNITY): Payer: Self-pay

## 2015-05-29 ENCOUNTER — Encounter (HOSPITAL_COMMUNITY)
Admission: RE | Admit: 2015-05-29 | Discharge: 2015-05-29 | Disposition: A | Discharge status: Home / Self Care | Payer: Self-pay | Source: Ambulatory Visit | Attending: Internal Medicine | Admitting: Internal Medicine

## 2015-06-01 HISTORY — PX: CERVICAL FUSION: SHX112

## 2015-06-03 ENCOUNTER — Encounter (HOSPITAL_COMMUNITY)
Admission: RE | Admit: 2015-06-03 | Discharge: 2015-06-03 | Disposition: A | Discharge status: Home / Self Care | Payer: Self-pay | Source: Ambulatory Visit | Attending: Internal Medicine | Admitting: Internal Medicine

## 2015-06-03 DIAGNOSIS — R0602 Shortness of breath: Secondary | ICD-10-CM | POA: Insufficient documentation

## 2015-06-03 DIAGNOSIS — Z5189 Encounter for other specified aftercare: Secondary | ICD-10-CM | POA: Insufficient documentation

## 2015-06-05 ENCOUNTER — Encounter (HOSPITAL_COMMUNITY)
Admission: RE | Admit: 2015-06-05 | Discharge: 2015-06-05 | Disposition: A | Discharge status: Home / Self Care | Payer: Self-pay | Source: Ambulatory Visit | Attending: Internal Medicine | Admitting: Internal Medicine

## 2015-06-10 ENCOUNTER — Encounter (HOSPITAL_COMMUNITY): Payer: Self-pay

## 2015-06-12 ENCOUNTER — Encounter (HOSPITAL_COMMUNITY)
Admission: RE | Admit: 2015-06-12 | Discharge: 2015-06-12 | Disposition: A | Discharge status: Home / Self Care | Payer: Self-pay | Source: Ambulatory Visit | Attending: Internal Medicine | Admitting: Internal Medicine

## 2015-06-17 ENCOUNTER — Encounter (HOSPITAL_COMMUNITY)
Admission: RE | Admit: 2015-06-17 | Discharge: 2015-06-17 | Disposition: A | Discharge status: Home / Self Care | Payer: Self-pay | Source: Ambulatory Visit | Attending: Internal Medicine | Admitting: Internal Medicine

## 2015-06-19 ENCOUNTER — Encounter (HOSPITAL_COMMUNITY)
Admission: RE | Admit: 2015-06-19 | Discharge: 2015-06-19 | Disposition: A | Discharge status: Home / Self Care | Payer: Self-pay | Source: Ambulatory Visit | Attending: Internal Medicine | Admitting: Internal Medicine

## 2015-06-24 ENCOUNTER — Encounter (HOSPITAL_COMMUNITY)
Admission: RE | Admit: 2015-06-24 | Discharge: 2015-06-24 | Disposition: A | Discharge status: Home / Self Care | Payer: Self-pay | Source: Ambulatory Visit | Attending: Internal Medicine | Admitting: Internal Medicine

## 2015-06-26 ENCOUNTER — Encounter (HOSPITAL_COMMUNITY)
Admission: RE | Admit: 2015-06-26 | Discharge: 2015-06-26 | Disposition: A | Discharge status: Home / Self Care | Payer: Self-pay | Source: Ambulatory Visit | Attending: Internal Medicine | Admitting: Internal Medicine

## 2015-07-01 ENCOUNTER — Encounter (HOSPITAL_COMMUNITY): Payer: Self-pay

## 2015-07-03 ENCOUNTER — Encounter (HOSPITAL_COMMUNITY): Payer: Worker's Compensation

## 2015-07-03 DIAGNOSIS — Z5189 Encounter for other specified aftercare: Secondary | ICD-10-CM | POA: Insufficient documentation

## 2015-07-03 DIAGNOSIS — R0602 Shortness of breath: Secondary | ICD-10-CM | POA: Insufficient documentation

## 2015-07-08 ENCOUNTER — Encounter (HOSPITAL_COMMUNITY): Payer: Worker's Compensation

## 2015-07-10 ENCOUNTER — Encounter (HOSPITAL_COMMUNITY)
Admission: RE | Admit: 2015-07-10 | Discharge: 2015-07-10 | Disposition: A | Discharge status: Home / Self Care | Payer: Worker's Compensation | Source: Ambulatory Visit | Attending: Internal Medicine | Admitting: Internal Medicine

## 2015-07-15 ENCOUNTER — Encounter (HOSPITAL_COMMUNITY)
Admission: RE | Admit: 2015-07-15 | Discharge: 2015-07-15 | Disposition: A | Discharge status: Home / Self Care | Payer: Worker's Compensation | Source: Ambulatory Visit | Attending: Internal Medicine | Admitting: Internal Medicine

## 2015-07-17 ENCOUNTER — Encounter (HOSPITAL_COMMUNITY)
Admission: RE | Admit: 2015-07-17 | Discharge: 2015-07-17 | Disposition: A | Discharge status: Home / Self Care | Payer: Worker's Compensation | Source: Ambulatory Visit | Attending: Internal Medicine | Admitting: Internal Medicine

## 2015-07-22 ENCOUNTER — Encounter (HOSPITAL_COMMUNITY)
Admission: RE | Admit: 2015-07-22 | Discharge: 2015-07-22 | Disposition: A | Discharge status: Home / Self Care | Payer: Worker's Compensation | Source: Ambulatory Visit | Attending: Internal Medicine | Admitting: Internal Medicine

## 2015-07-24 ENCOUNTER — Encounter (HOSPITAL_COMMUNITY)
Admission: RE | Admit: 2015-07-24 | Discharge: 2015-07-24 | Disposition: A | Discharge status: Home / Self Care | Payer: Worker's Compensation | Source: Ambulatory Visit | Attending: Internal Medicine | Admitting: Internal Medicine

## 2015-07-29 ENCOUNTER — Encounter (HOSPITAL_COMMUNITY): Payer: Worker's Compensation

## 2015-07-31 ENCOUNTER — Encounter (HOSPITAL_COMMUNITY)
Admission: RE | Admit: 2015-07-31 | Discharge: 2015-07-31 | Disposition: A | Discharge status: Home / Self Care | Payer: Worker's Compensation | Source: Ambulatory Visit | Attending: Internal Medicine | Admitting: Internal Medicine

## 2015-07-31 DIAGNOSIS — Z5189 Encounter for other specified aftercare: Secondary | ICD-10-CM | POA: Insufficient documentation

## 2015-07-31 DIAGNOSIS — R0602 Shortness of breath: Secondary | ICD-10-CM | POA: Insufficient documentation

## 2015-08-05 ENCOUNTER — Encounter (HOSPITAL_COMMUNITY)
Admission: RE | Admit: 2015-08-05 | Discharge: 2015-08-05 | Disposition: A | Discharge status: Home / Self Care | Payer: Self-pay | Source: Ambulatory Visit | Attending: Internal Medicine | Admitting: Internal Medicine

## 2015-08-07 ENCOUNTER — Encounter (HOSPITAL_COMMUNITY)
Admission: RE | Admit: 2015-08-07 | Discharge: 2015-08-07 | Disposition: A | Discharge status: Home / Self Care | Payer: Self-pay | Source: Ambulatory Visit | Attending: Internal Medicine | Admitting: Internal Medicine

## 2015-08-12 ENCOUNTER — Encounter (HOSPITAL_COMMUNITY)
Admission: RE | Admit: 2015-08-12 | Discharge: 2015-08-12 | Disposition: A | Discharge status: Home / Self Care | Payer: Self-pay | Source: Ambulatory Visit | Attending: Internal Medicine | Admitting: Internal Medicine

## 2015-08-14 ENCOUNTER — Encounter (HOSPITAL_COMMUNITY)
Admission: RE | Admit: 2015-08-14 | Discharge: 2015-08-14 | Disposition: A | Discharge status: Home / Self Care | Payer: Worker's Compensation | Source: Ambulatory Visit | Attending: Internal Medicine | Admitting: Internal Medicine

## 2015-08-19 ENCOUNTER — Encounter (HOSPITAL_COMMUNITY)
Admission: RE | Admit: 2015-08-19 | Discharge: 2015-08-19 | Disposition: A | Discharge status: Home / Self Care | Payer: Self-pay | Source: Ambulatory Visit | Attending: Internal Medicine | Admitting: Internal Medicine

## 2015-08-21 ENCOUNTER — Encounter (HOSPITAL_COMMUNITY)
Admission: RE | Admit: 2015-08-21 | Discharge: 2015-08-21 | Disposition: A | Discharge status: Home / Self Care | Payer: Worker's Compensation | Source: Ambulatory Visit | Attending: Internal Medicine | Admitting: Internal Medicine

## 2015-08-26 ENCOUNTER — Encounter (HOSPITAL_COMMUNITY)
Admission: RE | Admit: 2015-08-26 | Discharge: 2015-08-26 | Disposition: A | Discharge status: Home / Self Care | Payer: Self-pay | Source: Ambulatory Visit | Attending: Internal Medicine | Admitting: Internal Medicine

## 2015-08-28 ENCOUNTER — Encounter (HOSPITAL_COMMUNITY)
Admission: RE | Admit: 2015-08-28 | Discharge: 2015-08-28 | Disposition: A | Discharge status: Home / Self Care | Payer: Worker's Compensation | Source: Ambulatory Visit | Attending: Internal Medicine | Admitting: Internal Medicine

## 2015-09-02 ENCOUNTER — Encounter (HOSPITAL_COMMUNITY): Payer: Worker's Compensation

## 2015-09-02 DIAGNOSIS — R0602 Shortness of breath: Secondary | ICD-10-CM | POA: Insufficient documentation

## 2015-09-02 DIAGNOSIS — Z5189 Encounter for other specified aftercare: Secondary | ICD-10-CM | POA: Insufficient documentation

## 2015-09-04 ENCOUNTER — Encounter (HOSPITAL_COMMUNITY): Payer: Worker's Compensation

## 2015-09-09 ENCOUNTER — Encounter (HOSPITAL_COMMUNITY)
Admission: RE | Admit: 2015-09-09 | Discharge: 2015-09-09 | Disposition: A | Discharge status: Home / Self Care | Payer: Worker's Compensation | Source: Ambulatory Visit | Attending: Internal Medicine | Admitting: Internal Medicine

## 2015-09-11 ENCOUNTER — Encounter (HOSPITAL_COMMUNITY)
Admission: RE | Admit: 2015-09-11 | Discharge: 2015-09-11 | Disposition: A | Discharge status: Home / Self Care | Payer: Worker's Compensation | Source: Ambulatory Visit | Attending: Internal Medicine | Admitting: Internal Medicine

## 2015-09-16 ENCOUNTER — Encounter (HOSPITAL_COMMUNITY): Payer: Worker's Compensation

## 2015-09-18 ENCOUNTER — Encounter (HOSPITAL_COMMUNITY)
Admission: RE | Admit: 2015-09-18 | Discharge: 2015-09-18 | Disposition: A | Discharge status: Home / Self Care | Payer: Worker's Compensation | Source: Ambulatory Visit | Attending: Internal Medicine | Admitting: Internal Medicine

## 2015-09-23 ENCOUNTER — Encounter (HOSPITAL_COMMUNITY)
Admission: RE | Admit: 2015-09-23 | Discharge: 2015-09-23 | Disposition: A | Discharge status: Home / Self Care | Payer: Worker's Compensation | Source: Ambulatory Visit | Attending: Internal Medicine | Admitting: Internal Medicine

## 2015-09-25 ENCOUNTER — Encounter (HOSPITAL_COMMUNITY)
Admission: RE | Admit: 2015-09-25 | Discharge: 2015-09-25 | Disposition: A | Discharge status: Home / Self Care | Payer: Worker's Compensation | Source: Ambulatory Visit | Attending: Internal Medicine | Admitting: Internal Medicine

## 2015-09-30 ENCOUNTER — Encounter (HOSPITAL_COMMUNITY)
Admission: RE | Admit: 2015-09-30 | Discharge: 2015-09-30 | Disposition: A | Discharge status: Home / Self Care | Payer: Worker's Compensation | Source: Ambulatory Visit | Attending: Internal Medicine | Admitting: Internal Medicine

## 2015-09-30 DIAGNOSIS — Z5189 Encounter for other specified aftercare: Secondary | ICD-10-CM | POA: Diagnosis not present

## 2015-09-30 DIAGNOSIS — R0602 Shortness of breath: Secondary | ICD-10-CM | POA: Diagnosis not present

## 2015-10-01 ENCOUNTER — Ambulatory Visit
Admission: RE | Admit: 2015-10-01 | Discharge: 2015-10-01 | Disposition: A | Discharge status: Home / Self Care | Payer: Disability Insurance | Source: Ambulatory Visit | Attending: Internal Medicine | Admitting: Internal Medicine

## 2015-10-01 ENCOUNTER — Other Ambulatory Visit: Payer: Self-pay | Admitting: Internal Medicine

## 2015-10-01 DIAGNOSIS — M5030 Other cervical disc degeneration, unspecified cervical region: Secondary | ICD-10-CM | POA: Insufficient documentation

## 2015-10-01 DIAGNOSIS — Z981 Arthrodesis status: Secondary | ICD-10-CM | POA: Insufficient documentation

## 2015-10-01 DIAGNOSIS — M542 Cervicalgia: Secondary | ICD-10-CM | POA: Insufficient documentation

## 2015-10-01 DIAGNOSIS — M25512 Pain in left shoulder: Secondary | ICD-10-CM

## 2015-10-01 DIAGNOSIS — M24112 Other articular cartilage disorders, left shoulder: Secondary | ICD-10-CM | POA: Insufficient documentation

## 2015-10-02 ENCOUNTER — Encounter (HOSPITAL_COMMUNITY)
Admission: RE | Admit: 2015-10-02 | Discharge: 2015-10-02 | Disposition: A | Discharge status: Home / Self Care | Payer: Worker's Compensation | Source: Ambulatory Visit | Attending: Internal Medicine | Admitting: Internal Medicine

## 2015-10-07 ENCOUNTER — Encounter (HOSPITAL_COMMUNITY)
Admission: RE | Admit: 2015-10-07 | Discharge: 2015-10-07 | Disposition: A | Discharge status: Home / Self Care | Payer: Worker's Compensation | Source: Ambulatory Visit | Attending: Internal Medicine | Admitting: Internal Medicine

## 2015-10-09 ENCOUNTER — Encounter (HOSPITAL_COMMUNITY)
Admission: RE | Admit: 2015-10-09 | Discharge: 2015-10-09 | Disposition: A | Discharge status: Home / Self Care | Payer: Worker's Compensation | Source: Ambulatory Visit | Attending: Internal Medicine | Admitting: Internal Medicine

## 2015-10-14 ENCOUNTER — Encounter (HOSPITAL_COMMUNITY)
Admission: RE | Admit: 2015-10-14 | Discharge: 2015-10-14 | Disposition: A | Discharge status: Home / Self Care | Payer: Worker's Compensation | Source: Ambulatory Visit | Attending: Internal Medicine | Admitting: Internal Medicine

## 2015-10-16 ENCOUNTER — Encounter (HOSPITAL_COMMUNITY)
Admission: RE | Admit: 2015-10-16 | Discharge: 2015-10-16 | Disposition: A | Discharge status: Home / Self Care | Payer: Worker's Compensation | Source: Ambulatory Visit | Attending: Internal Medicine | Admitting: Internal Medicine

## 2015-10-21 ENCOUNTER — Encounter (HOSPITAL_COMMUNITY): Payer: Worker's Compensation

## 2015-10-23 ENCOUNTER — Encounter (HOSPITAL_COMMUNITY)
Admission: RE | Admit: 2015-10-23 | Discharge: 2015-10-23 | Disposition: A | Discharge status: Home / Self Care | Payer: Worker's Compensation | Source: Ambulatory Visit | Attending: Internal Medicine | Admitting: Internal Medicine

## 2015-10-28 ENCOUNTER — Encounter (HOSPITAL_COMMUNITY): Payer: Worker's Compensation

## 2015-10-30 ENCOUNTER — Encounter (HOSPITAL_COMMUNITY)
Admission: RE | Admit: 2015-10-30 | Discharge: 2015-10-30 | Disposition: A | Discharge status: Home / Self Care | Payer: Worker's Compensation | Source: Ambulatory Visit | Attending: Internal Medicine | Admitting: Internal Medicine

## 2015-10-30 DIAGNOSIS — Z5189 Encounter for other specified aftercare: Secondary | ICD-10-CM | POA: Diagnosis not present

## 2015-10-30 DIAGNOSIS — R0602 Shortness of breath: Secondary | ICD-10-CM | POA: Insufficient documentation

## 2015-11-04 ENCOUNTER — Encounter (HOSPITAL_COMMUNITY)
Admission: RE | Admit: 2015-11-04 | Discharge: 2015-11-04 | Disposition: A | Discharge status: Home / Self Care | Payer: Worker's Compensation | Source: Ambulatory Visit | Attending: Internal Medicine | Admitting: Internal Medicine

## 2015-11-04 ENCOUNTER — Encounter (HOSPITAL_COMMUNITY): Payer: Worker's Compensation

## 2015-11-06 ENCOUNTER — Encounter (HOSPITAL_COMMUNITY)
Admission: RE | Admit: 2015-11-06 | Discharge: 2015-11-06 | Disposition: A | Discharge status: Home / Self Care | Payer: Worker's Compensation | Source: Ambulatory Visit | Attending: Internal Medicine | Admitting: Internal Medicine

## 2015-11-06 ENCOUNTER — Encounter (HOSPITAL_COMMUNITY): Payer: Worker's Compensation

## 2015-11-11 ENCOUNTER — Encounter (HOSPITAL_COMMUNITY): Payer: Worker's Compensation

## 2015-11-11 ENCOUNTER — Encounter (HOSPITAL_COMMUNITY)
Admission: RE | Admit: 2015-11-11 | Discharge: 2015-11-11 | Disposition: A | Discharge status: Home / Self Care | Payer: Worker's Compensation | Source: Ambulatory Visit | Attending: Internal Medicine | Admitting: Internal Medicine

## 2015-11-13 ENCOUNTER — Encounter (HOSPITAL_COMMUNITY): Payer: Worker's Compensation

## 2015-11-18 ENCOUNTER — Encounter (HOSPITAL_COMMUNITY): Payer: Worker's Compensation

## 2015-11-18 ENCOUNTER — Encounter (HOSPITAL_COMMUNITY)
Admission: RE | Admit: 2015-11-18 | Discharge: 2015-11-18 | Disposition: A | Discharge status: Home / Self Care | Payer: Worker's Compensation | Source: Ambulatory Visit | Attending: Internal Medicine | Admitting: Internal Medicine

## 2015-11-20 ENCOUNTER — Encounter (HOSPITAL_COMMUNITY)
Admission: RE | Admit: 2015-11-20 | Discharge: 2015-11-20 | Disposition: A | Discharge status: Home / Self Care | Payer: Worker's Compensation | Source: Ambulatory Visit | Attending: Internal Medicine | Admitting: Internal Medicine

## 2015-11-20 ENCOUNTER — Encounter (HOSPITAL_COMMUNITY): Payer: Worker's Compensation

## 2015-11-25 ENCOUNTER — Encounter (HOSPITAL_COMMUNITY)
Admission: RE | Admit: 2015-11-25 | Discharge: 2015-11-25 | Disposition: A | Discharge status: Home / Self Care | Payer: Worker's Compensation | Source: Ambulatory Visit | Attending: Internal Medicine | Admitting: Internal Medicine

## 2015-11-25 ENCOUNTER — Encounter (HOSPITAL_COMMUNITY): Payer: Worker's Compensation

## 2015-11-27 ENCOUNTER — Encounter (HOSPITAL_COMMUNITY): Payer: Worker's Compensation

## 2015-11-27 ENCOUNTER — Encounter (HOSPITAL_COMMUNITY)
Admission: RE | Admit: 2015-11-27 | Discharge: 2015-11-27 | Disposition: A | Discharge status: Home / Self Care | Payer: Worker's Compensation | Source: Ambulatory Visit | Attending: Internal Medicine | Admitting: Internal Medicine

## 2015-11-27 ENCOUNTER — Other Ambulatory Visit: Payer: Self-pay | Admitting: Specialist

## 2015-11-27 DIAGNOSIS — M542 Cervicalgia: Secondary | ICD-10-CM

## 2015-12-02 ENCOUNTER — Encounter (HOSPITAL_COMMUNITY): Payer: Worker's Compensation

## 2015-12-04 ENCOUNTER — Encounter (HOSPITAL_COMMUNITY): Payer: Worker's Compensation

## 2015-12-09 ENCOUNTER — Other Ambulatory Visit: Payer: Self-pay | Admitting: Anesthesiology

## 2015-12-09 ENCOUNTER — Encounter (HOSPITAL_COMMUNITY): Payer: Worker's Compensation

## 2015-12-09 DIAGNOSIS — M546 Pain in thoracic spine: Secondary | ICD-10-CM

## 2015-12-11 ENCOUNTER — Encounter (HOSPITAL_COMMUNITY): Payer: Worker's Compensation

## 2015-12-12 ENCOUNTER — Ambulatory Visit
Admission: RE | Admit: 2015-12-12 | Discharge: 2015-12-12 | Disposition: A | Discharge status: Home / Self Care | Payer: Worker's Compensation | Source: Ambulatory Visit | Attending: Specialist | Admitting: Specialist

## 2015-12-12 DIAGNOSIS — M542 Cervicalgia: Secondary | ICD-10-CM

## 2015-12-15 ENCOUNTER — Ambulatory Visit
Admission: RE | Admit: 2015-12-15 | Discharge: 2015-12-15 | Disposition: A | Discharge status: Home / Self Care | Payer: Worker's Compensation | Source: Ambulatory Visit | Attending: Anesthesiology | Admitting: Anesthesiology

## 2015-12-15 DIAGNOSIS — M546 Pain in thoracic spine: Secondary | ICD-10-CM

## 2015-12-16 ENCOUNTER — Encounter (HOSPITAL_COMMUNITY): Payer: Worker's Compensation

## 2015-12-18 ENCOUNTER — Encounter (HOSPITAL_COMMUNITY): Payer: Worker's Compensation

## 2015-12-23 ENCOUNTER — Encounter (HOSPITAL_COMMUNITY): Payer: Worker's Compensation

## 2015-12-25 ENCOUNTER — Encounter (HOSPITAL_COMMUNITY): Payer: Worker's Compensation

## 2015-12-30 ENCOUNTER — Encounter (HOSPITAL_COMMUNITY): Payer: Worker's Compensation

## 2016-01-01 ENCOUNTER — Encounter (HOSPITAL_COMMUNITY): Payer: Worker's Compensation

## 2016-01-06 ENCOUNTER — Encounter (HOSPITAL_COMMUNITY): Payer: Worker's Compensation

## 2016-01-08 ENCOUNTER — Encounter (HOSPITAL_COMMUNITY): Payer: Worker's Compensation

## 2016-01-13 ENCOUNTER — Encounter (HOSPITAL_COMMUNITY): Payer: Worker's Compensation

## 2016-01-15 ENCOUNTER — Encounter (HOSPITAL_COMMUNITY): Payer: Worker's Compensation

## 2016-01-20 ENCOUNTER — Encounter (HOSPITAL_COMMUNITY): Payer: Worker's Compensation

## 2016-01-22 ENCOUNTER — Encounter (HOSPITAL_COMMUNITY): Payer: Worker's Compensation

## 2016-01-27 ENCOUNTER — Encounter (HOSPITAL_COMMUNITY): Payer: Worker's Compensation

## 2016-01-29 ENCOUNTER — Encounter (HOSPITAL_COMMUNITY): Payer: Worker's Compensation

## 2016-02-03 ENCOUNTER — Encounter (HOSPITAL_COMMUNITY): Payer: Worker's Compensation

## 2016-02-04 DIAGNOSIS — I10 Essential (primary) hypertension: Secondary | ICD-10-CM | POA: Diagnosis not present

## 2016-02-05 ENCOUNTER — Encounter (HOSPITAL_COMMUNITY): Payer: Worker's Compensation

## 2016-02-10 ENCOUNTER — Encounter (HOSPITAL_COMMUNITY): Payer: Worker's Compensation

## 2016-02-12 ENCOUNTER — Encounter (HOSPITAL_COMMUNITY): Payer: Worker's Compensation

## 2016-02-17 ENCOUNTER — Encounter (HOSPITAL_COMMUNITY): Payer: Worker's Compensation

## 2016-02-19 ENCOUNTER — Encounter (HOSPITAL_COMMUNITY): Payer: Worker's Compensation

## 2016-02-24 ENCOUNTER — Encounter (HOSPITAL_COMMUNITY): Payer: Worker's Compensation

## 2016-02-26 ENCOUNTER — Encounter (HOSPITAL_COMMUNITY): Payer: Worker's Compensation

## 2016-03-02 ENCOUNTER — Encounter (HOSPITAL_COMMUNITY): Payer: Worker's Compensation

## 2016-03-04 ENCOUNTER — Encounter (HOSPITAL_COMMUNITY): Payer: Worker's Compensation

## 2016-03-09 ENCOUNTER — Encounter (HOSPITAL_COMMUNITY): Payer: Worker's Compensation

## 2016-03-11 ENCOUNTER — Encounter (HOSPITAL_COMMUNITY): Payer: Worker's Compensation

## 2016-03-15 DIAGNOSIS — F419 Anxiety disorder, unspecified: Secondary | ICD-10-CM | POA: Diagnosis not present

## 2016-03-15 DIAGNOSIS — I10 Essential (primary) hypertension: Secondary | ICD-10-CM | POA: Diagnosis not present

## 2016-03-15 DIAGNOSIS — Z23 Encounter for immunization: Secondary | ICD-10-CM | POA: Diagnosis not present

## 2016-03-16 ENCOUNTER — Encounter (HOSPITAL_COMMUNITY): Payer: Worker's Compensation

## 2016-03-18 ENCOUNTER — Encounter (HOSPITAL_COMMUNITY): Payer: Worker's Compensation

## 2016-03-23 ENCOUNTER — Ambulatory Visit
Admission: RE | Admit: 2016-03-23 | Discharge: 2016-03-23 | Disposition: A | Discharge status: Home / Self Care | Payer: Worker's Compensation | Source: Ambulatory Visit | Attending: Specialist | Admitting: Specialist

## 2016-03-23 ENCOUNTER — Encounter (HOSPITAL_COMMUNITY): Payer: Worker's Compensation

## 2016-03-23 ENCOUNTER — Other Ambulatory Visit: Payer: Self-pay | Admitting: Specialist

## 2016-03-23 DIAGNOSIS — Z9889 Other specified postprocedural states: Secondary | ICD-10-CM | POA: Insufficient documentation

## 2016-03-23 DIAGNOSIS — M542 Cervicalgia: Secondary | ICD-10-CM | POA: Insufficient documentation

## 2016-03-23 DIAGNOSIS — Z981 Arthrodesis status: Secondary | ICD-10-CM | POA: Diagnosis not present

## 2016-03-25 ENCOUNTER — Encounter (HOSPITAL_COMMUNITY): Payer: Worker's Compensation

## 2016-03-30 ENCOUNTER — Encounter (HOSPITAL_COMMUNITY): Payer: Worker's Compensation

## 2016-04-01 ENCOUNTER — Encounter (HOSPITAL_COMMUNITY): Payer: Worker's Compensation

## 2016-04-06 ENCOUNTER — Encounter (HOSPITAL_COMMUNITY): Payer: Worker's Compensation

## 2016-04-08 ENCOUNTER — Encounter (HOSPITAL_COMMUNITY): Payer: Worker's Compensation

## 2016-04-13 ENCOUNTER — Encounter (HOSPITAL_COMMUNITY): Payer: Worker's Compensation

## 2016-04-15 ENCOUNTER — Encounter (HOSPITAL_COMMUNITY): Payer: Worker's Compensation

## 2016-04-15 DIAGNOSIS — Z9181 History of falling: Secondary | ICD-10-CM | POA: Diagnosis not present

## 2016-04-15 DIAGNOSIS — I2692 Saddle embolus of pulmonary artery without acute cor pulmonale: Secondary | ICD-10-CM | POA: Diagnosis not present

## 2016-04-15 DIAGNOSIS — K219 Gastro-esophageal reflux disease without esophagitis: Secondary | ICD-10-CM | POA: Diagnosis not present

## 2016-04-15 DIAGNOSIS — M4322 Fusion of spine, cervical region: Secondary | ICD-10-CM | POA: Diagnosis not present

## 2016-04-15 DIAGNOSIS — R490 Dysphonia: Secondary | ICD-10-CM | POA: Diagnosis not present

## 2016-04-15 DIAGNOSIS — R1310 Dysphagia, unspecified: Secondary | ICD-10-CM | POA: Diagnosis not present

## 2016-04-15 DIAGNOSIS — M6250 Muscle wasting and atrophy, not elsewhere classified, unspecified site: Secondary | ICD-10-CM | POA: Diagnosis not present

## 2016-04-15 DIAGNOSIS — M6281 Muscle weakness (generalized): Secondary | ICD-10-CM | POA: Diagnosis not present

## 2016-04-15 DIAGNOSIS — R471 Dysarthria and anarthria: Secondary | ICD-10-CM | POA: Diagnosis not present

## 2016-04-15 DIAGNOSIS — I2699 Other pulmonary embolism without acute cor pulmonale: Secondary | ICD-10-CM | POA: Diagnosis not present

## 2016-04-15 DIAGNOSIS — J189 Pneumonia, unspecified organism: Secondary | ICD-10-CM | POA: Diagnosis not present

## 2016-04-15 DIAGNOSIS — R531 Weakness: Secondary | ICD-10-CM | POA: Diagnosis not present

## 2016-04-15 DIAGNOSIS — R2681 Unsteadiness on feet: Secondary | ICD-10-CM | POA: Diagnosis not present

## 2016-04-15 DIAGNOSIS — R262 Difficulty in walking, not elsewhere classified: Secondary | ICD-10-CM | POA: Diagnosis not present

## 2016-04-20 ENCOUNTER — Encounter (HOSPITAL_COMMUNITY): Payer: Worker's Compensation

## 2016-04-22 ENCOUNTER — Encounter (HOSPITAL_COMMUNITY): Payer: Worker's Compensation

## 2016-04-23 DIAGNOSIS — R479 Unspecified speech disturbances: Secondary | ICD-10-CM | POA: Diagnosis not present

## 2016-04-23 DIAGNOSIS — I10 Essential (primary) hypertension: Secondary | ICD-10-CM | POA: Diagnosis not present

## 2016-04-23 DIAGNOSIS — Z9889 Other specified postprocedural states: Secondary | ICD-10-CM | POA: Diagnosis not present

## 2016-04-23 DIAGNOSIS — F411 Generalized anxiety disorder: Secondary | ICD-10-CM | POA: Diagnosis not present

## 2016-04-23 DIAGNOSIS — K5900 Constipation, unspecified: Secondary | ICD-10-CM | POA: Diagnosis not present

## 2016-04-23 DIAGNOSIS — I2699 Other pulmonary embolism without acute cor pulmonale: Secondary | ICD-10-CM | POA: Diagnosis not present

## 2016-04-23 DIAGNOSIS — J189 Pneumonia, unspecified organism: Secondary | ICD-10-CM | POA: Diagnosis not present

## 2016-04-27 ENCOUNTER — Encounter (HOSPITAL_COMMUNITY): Payer: Worker's Compensation

## 2016-04-29 ENCOUNTER — Encounter (HOSPITAL_COMMUNITY): Payer: Worker's Compensation

## 2016-04-30 DIAGNOSIS — J189 Pneumonia, unspecified organism: Secondary | ICD-10-CM | POA: Diagnosis not present

## 2016-04-30 DIAGNOSIS — I2699 Other pulmonary embolism without acute cor pulmonale: Secondary | ICD-10-CM | POA: Diagnosis not present

## 2016-04-30 DIAGNOSIS — R0602 Shortness of breath: Secondary | ICD-10-CM | POA: Diagnosis not present

## 2016-05-04 ENCOUNTER — Encounter (HOSPITAL_COMMUNITY): Payer: Worker's Compensation

## 2016-05-06 ENCOUNTER — Encounter (HOSPITAL_COMMUNITY): Payer: Worker's Compensation

## 2016-05-07 DIAGNOSIS — F329 Major depressive disorder, single episode, unspecified: Secondary | ICD-10-CM | POA: Diagnosis not present

## 2016-05-11 ENCOUNTER — Encounter (HOSPITAL_COMMUNITY): Payer: Worker's Compensation

## 2016-05-13 ENCOUNTER — Encounter (HOSPITAL_COMMUNITY): Payer: Worker's Compensation

## 2016-05-18 ENCOUNTER — Encounter (HOSPITAL_COMMUNITY): Payer: Worker's Compensation

## 2016-05-20 ENCOUNTER — Encounter (HOSPITAL_COMMUNITY): Payer: Worker's Compensation

## 2016-05-27 DIAGNOSIS — R490 Dysphonia: Secondary | ICD-10-CM | POA: Diagnosis not present

## 2016-05-27 DIAGNOSIS — K219 Gastro-esophageal reflux disease without esophagitis: Secondary | ICD-10-CM | POA: Diagnosis not present

## 2016-06-30 ENCOUNTER — Other Ambulatory Visit: Payer: Self-pay | Admitting: Specialist

## 2016-06-30 ENCOUNTER — Ambulatory Visit
Admission: RE | Admit: 2016-06-30 | Discharge: 2016-06-30 | Disposition: A | Discharge status: Home / Self Care | Payer: Worker's Compensation | Source: Ambulatory Visit | Attending: Specialist | Admitting: Specialist

## 2016-06-30 DIAGNOSIS — M50322 Other cervical disc degeneration at C5-C6 level: Secondary | ICD-10-CM | POA: Insufficient documentation

## 2016-06-30 DIAGNOSIS — M5031 Other cervical disc degeneration,  high cervical region: Secondary | ICD-10-CM | POA: Diagnosis not present

## 2016-06-30 DIAGNOSIS — M545 Low back pain: Secondary | ICD-10-CM

## 2016-06-30 DIAGNOSIS — Z981 Arthrodesis status: Secondary | ICD-10-CM | POA: Insufficient documentation

## 2016-06-30 DIAGNOSIS — M50323 Other cervical disc degeneration at C6-C7 level: Secondary | ICD-10-CM | POA: Diagnosis not present

## 2016-06-30 DIAGNOSIS — Z9889 Other specified postprocedural states: Secondary | ICD-10-CM | POA: Diagnosis present

## 2016-07-05 DIAGNOSIS — I10 Essential (primary) hypertension: Secondary | ICD-10-CM | POA: Diagnosis not present

## 2016-07-05 DIAGNOSIS — D649 Anemia, unspecified: Secondary | ICD-10-CM | POA: Diagnosis not present

## 2016-07-05 DIAGNOSIS — Z1159 Encounter for screening for other viral diseases: Secondary | ICD-10-CM | POA: Diagnosis not present

## 2016-07-23 ENCOUNTER — Encounter (HOSPITAL_COMMUNITY): Payer: Self-pay

## 2016-07-23 ENCOUNTER — Encounter (HOSPITAL_COMMUNITY)
Admission: RE | Admit: 2016-07-23 | Discharge: 2016-07-23 | Disposition: A | Discharge status: Home / Self Care | Payer: Self-pay | Source: Ambulatory Visit | Attending: Pulmonary Disease | Admitting: Pulmonary Disease

## 2016-07-23 VITALS — BP 162/80 | HR 61 | Resp 18 | Ht <= 58 in | Wt 169.5 lb

## 2016-07-23 DIAGNOSIS — I2699 Other pulmonary embolism without acute cor pulmonale: Secondary | ICD-10-CM

## 2016-07-23 DIAGNOSIS — R0602 Shortness of breath: Secondary | ICD-10-CM | POA: Insufficient documentation

## 2016-07-23 HISTORY — DX: Essential (primary) hypertension: I10

## 2016-07-23 NOTE — Progress Notes (Signed)
Holly Bowen 61 y.o. female Pulmonary Rehab Orientation Note Patient arrived today in Cardiac and Pulmonary Rehab for orientation to Pulmonary Rehab. She ambulated from [employer]. She does not carry portable oxygen and has not been prescribed oxygen for home use. Color good, skin warm and dry. Patient is oriented to time and place. Patient's medical history, psychosocial health, and medications reviewed. Psychosocial assessment reveals pt lives alone. Pt is currently unemployed, disabled. Pt hobbies include spending time with her dogs}. Pt reports her stress level is high. Areas of stress/anxiety include Health and her workman's comp situation.  Pt does not exhibit signs of depression.  PHQ2/9 score 1/na. Pt shows good  coping skills with questionable outlook. She is offered emotional support and reassurance. Will continue to monitor and evaluate progress toward psychosocial goal(s) of remaining positive about her pulmonary prognosis and managing the stress induced by her medical situation and workman's comp. Physical assessment reveals heart rate is normal, breath sounds clear to auscultation, no wheezes, rales, or rhonchi. Grip strength equal, strong. Distal pulses palpable. No edema. Patient reports she does take medications as prescribed. Patient states she follows a Regular diet. The patient reports no specific efforts to gain or lose weight.. Patient's weight will be monitored closely. Demonstration and practice of PLB using pulse oximeter. Patient able to return demonstration satisfactorily. Safety and hand hygiene in the exercise area reviewed with patient. Patient voices understanding of the information reviewed. Department expectations discussed with patient and achievable goals were set. The patient shows enthusiasm about attending the program and we look forward to working with this nice lady. The patient is scheduled for a 6 min walk test on 07/29/16 and to begin exercise on 08/05/16 at 1030.   45  minutes was spent on a variety of activities such as assessment of the patient, obtaining baseline data including height, weight, BMI, and grip strength, verifying medical history, allergies, and current medications, and teaching patient strategies for performing tasks with less respiratory effort with emphasis on pursed lip breathing.

## 2016-07-28 ENCOUNTER — Encounter (HOSPITAL_COMMUNITY): Payer: Self-pay | Admitting: *Deleted

## 2016-07-29 ENCOUNTER — Encounter (HOSPITAL_COMMUNITY)
Admission: RE | Admit: 2016-07-29 | Discharge: 2016-07-29 | Disposition: A | Discharge status: Home / Self Care | Payer: Worker's Compensation | Source: Ambulatory Visit | Attending: Pulmonary Disease | Admitting: Pulmonary Disease

## 2016-07-29 DIAGNOSIS — R0602 Shortness of breath: Secondary | ICD-10-CM | POA: Diagnosis not present

## 2016-07-29 DIAGNOSIS — I2699 Other pulmonary embolism without acute cor pulmonale: Secondary | ICD-10-CM

## 2016-07-29 NOTE — Progress Notes (Signed)
Pulmonary Individual Treatment Plan  Patient Details  Name: Holly Bowen MRN: [id number] Date of Birth: 06/30/1955 Referring Provider:   April Manson Pulmonary Rehab Walk Test from 07/29/2016 in [place]  Referring Provider  Dr. Nelda Marseille      Initial Encounter Date:  Flowsheet Row Pulmonary Rehab Walk Test from 07/29/2016 in [place]  Date  07/29/16  Referring Provider  Dr. Nelda Marseille      Visit Diagnosis: Other acute pulmonary embolism without acute cor pulmonale ([place])  Patient's Home Medications on Admission:   Current Outpatient Prescriptions:  .  acetaminophen (TYLENOL) 325 MG tablet, Take 650 mg by mouth., Disp: , Rfl:  .  busPIRone (BUSPAR) 30 MG tablet, Take by mouth., Disp: , Rfl:  .  Cyanocobalamin 1000 MCG/ML LIQD, Take by mouth., Disp: , Rfl:  .  lisinopril-hydrochlorothiazide (PRINZIDE) 20-12.5 MG per tablet, Take by mouth., Disp: , Rfl:  .  metoprolol (LOPRESSOR) 50 MG tablet, Take 50 mg by mouth., Disp: , Rfl:  .  Misc. Devices MISC, Patient is still unable to return to work at this time., Disp: , Rfl:  .  oxycodone (OXY-IR) 5 MG capsule, Take 5 mg by mouth., Disp: , Rfl:  .  oxyCODONE (OXYCONTIN) 10 mg 12 hr tablet, Take by mouth., Disp: , Rfl:  .  risperiDONE (RISPERDAL) 0.25 MG tablet, Take 0.25 mg by mouth. Patient only takes 1 tab at hour of sleep, Disp: , Rfl:  .  rivaroxaban (XARELTO) 20 MG TABS tablet, Take by mouth., Disp: , Rfl:   Past Medical History: Past Medical History:  Diagnosis Date  . Hypertension     Tobacco Use: History  Smoking Status  . Never Smoker  Smokeless Tobacco  . Never Used    Labs: Recent Review Flowsheet Data    Labs for ITP Cardiac and Pulmonary Rehab Latest Ref Rng & Units 08/20/2008 10/13/2009   TCO2 0 - 100 mmol/L 26 27      Capillary Blood Glucose: No results found for: GLUCAP   ADL UCSD:     Pulmonary Assessment Scores    Row Name 07/28/16 1155          ADL UCSD   ADL Phase Entry     SOB Score total 38        Pulmonary Function Assessment:     Pulmonary Function Assessment - 07/23/16 1051      Breath   Bilateral Breath Sounds Clear   Shortness of Breath Yes;Limiting activity      Exercise Target Goals: Date: 07/29/16  Exercise Program Goal: Individual exercise prescription set with THRR, safety & activity barriers. Participant demonstrates ability to understand and report RPE using BORG scale, to self-measure pulse accurately, and to acknowledge the importance of the exercise prescription.  Exercise Prescription Goal: Starting with aerobic activity 30 plus minutes a day, 3 days per week for initial exercise prescription. Provide home exercise prescription and guidelines that participant acknowledges understanding prior to discharge.  Activity Barriers & Risk Stratification:     Activity Barriers & Cardiac Risk Stratification - 07/23/16 1050      Activity Barriers & Cardiac Risk Stratification   Activity Barriers Back Problems;Shortness of Breath;Deconditioning;Other (comment)   Comments chronic pain      6 Minute Walk:     6 Minute Walk    Row Name 07/29/16 1657         6 Minute Walk   Phase Initial     Distance  1490 feet     Walk Time 6 minutes     # of Rest Breaks 0     MPH 2.82     METS 3.14     RPE 11     Perceived Dyspnea  1     Symptoms No     Resting HR 77 bpm     Resting BP 132/88     Max Ex. HR 100 bpm     Max Ex. BP 160/100     2 Minute Post BP 155/99       Interval HR   Baseline HR 77     1 Minute HR 89     2 Minute HR 95     3 Minute HR 97     4 Minute HR 99     5 Minute HR 98     6 Minute HR 100     2 Minute Post HR 81     Interval Heart Rate? Yes       Interval Oxygen   Interval Oxygen? Yes     Baseline Oxygen Saturation % 95 %     Baseline Liters of Oxygen 0 L     1 Minute Oxygen Saturation % 93 %     1 Minute Liters of Oxygen 0 L     2 Minute Oxygen Saturation %  92 %     2 Minute Liters of Oxygen 0 L     3 Minute Oxygen Saturation % 93 %     3 Minute Liters of Oxygen 0 L     4 Minute Oxygen Saturation % 93 %     4 Minute Liters of Oxygen 0 L     5 Minute Oxygen Saturation % 94 %     5 Minute Liters of Oxygen 0 L     6 Minute Oxygen Saturation % 94 %     6 Minute Liters of Oxygen 0 L     2 Minute Post Oxygen Saturation % 96 %     2 Minute Post Liters of Oxygen 0 L        Oxygen Initial Assessment:   Oxygen Re-Evaluation:   Oxygen Discharge (Final Oxygen Re-Evaluation):   Initial Exercise Prescription:     Initial Exercise Prescription - 07/29/16 1700      Date of Initial Exercise RX and Referring Provider   Date 07/29/16   Referring Provider Dr. Nelda Marseille     Bike   Level 2   Minutes 17     NuStep   Level 2   Minutes 17   METs 1.5     Track   Laps 8   Minutes 17     Prescription Details   Frequency (times per week) 2   Duration Progress to 45 minutes of aerobic exercise without signs/symptoms of physical distress     Intensity   THRR 40-80% of Max Heartrate 63-126   Ratings of Perceived Exertion 11-13   Perceived Dyspnea 0-4     Progression   Progression Continue progressive overload as per policy without signs/symptoms or physical distress.     Resistance Training   Training Prescription Yes   Weight orange bands   Reps 10-15      Perform Capillary Blood Glucose checks as needed.  Exercise Prescription Changes:   Exercise Comments:   Exercise Goals and Review:   Exercise Goals Re-Evaluation :   Discharge Exercise Prescription (Final Exercise Prescription Changes):   Nutrition:  Target  Goals: Understanding of nutrition guidelines, daily intake of sodium 1500mg , cholesterol 200mg , calories 30% from fat and 7% or less from saturated fats, daily to have 5 or more servings of fruits and vegetables.  Biometrics:     Pre Biometrics - 07/23/16 1059      Pre Biometrics   Grip Strength 22 kg        Nutrition Therapy Plan and Nutrition Goals:   Nutrition Discharge: Rate Your Plate Scores:   Nutrition Goals Re-Evaluation:   Nutrition Goals Discharge (Final Nutrition Goals Re-Evaluation):   Psychosocial: Target Goals: Acknowledge presence or absence of significant depression and/or stress, maximize coping skills, provide positive support system. Participant is able to verbalize types and ability to use techniques and skills needed for reducing stress and depression.  Initial Review & Psychosocial Screening:     Initial Psych Review & Screening - 07/23/16 1053      Initial Review   Current issues with Current Stress Concerns   Source of Stress Concerns Chronic Illness;Unable to participate in former interests or hobbies;Unable to perform yard/household activities     [place]? Yes     Barriers   Psychosocial barriers to participate in program There are no identifiable barriers or psychosocial needs.     Screening Interventions   Interventions Encouraged to exercise      Quality of Life Scores:     Quality of Life - 07/28/16 1155      Quality of Life Scores   Health/Function Pre 14.07 %   Socioeconomic Pre 22.29 %   Psych/Spiritual Pre 20.75 %   Family Pre 21.83 %   GLOBAL Pre 18.1 %      PHQ-9: Recent Review Flowsheet Data    Depression screen [facility] 2/9 07/23/2016 08/08/2014 03/08/2014   Decreased Interest 1 2 3    Down, Depressed, Hopeless 0 0 3   PHQ - 2 Score 1 2 6    Altered sleeping - 0 3    Tired, decreased energy - 3 3   Change in appetite - 0 3    Feeling bad or failure about yourself  - 0 0   Trouble concentrating - 0 0   Moving slowly or fidgety/restless - 0 1    Suicidal thoughts - 0 0   PHQ-9 Score - 5 16     Interpretation of Total Score  Total Score Depression Severity:  1-4 = Minimal depression, 5-9 = Mild depression, 10-14 = Moderate depression, 15-19 = Moderately severe depression, 20-27 = Severe  depression   Psychosocial Evaluation and Intervention:   Psychosocial Re-Evaluation:   Psychosocial Discharge (Final Psychosocial Re-Evaluation):   Education: Education Goals: Education classes will be provided on a weekly basis, covering required topics. Participant will state understanding/return demonstration of topics presented.  Learning Barriers/Preferences:     Learning Barriers/Preferences - 07/23/16 1050      Learning Barriers/Preferences   Learning Barriers None   Learning Preferences Audio;Computer/Internet;Group Instruction;Individual Instruction;Pictoral;Skilled Demonstration;Verbal Instruction;Video;Written Material      Education Topics: Risk Factor Reduction:  -Group instruction that is supported by a PowerPoint presentation. Instructor discusses the definition of a risk factor, different risk factors for pulmonary disease, and how the heart and lungs work together.     Nutrition for Pulmonary Patient:  -Group instruction provided by PowerPoint slides, verbal discussion, and written materials to support subject matter. The instructor gives an explanation and review of healthy diet recommendations, which includes a discussion on weight management, recommendations for fruit and  vegetable consumption, as well as protein, fluid, caffeine, fiber, sodium, sugar, and alcohol. Tips for eating when patients are short of breath are discussed.   Pursed Lip Breathing:  -Group instruction that is supported by demonstration and informational handouts. Instructor discusses the benefits of pursed lip and diaphragmatic breathing and detailed demonstration on how to preform both.     Oxygen Safety:  -Group instruction provided by PowerPoint, verbal discussion, and written material to support subject matter. There is an overview of "What is Oxygen" and "Why do we need it".  Instructor also reviews how to create a safe environment for oxygen use, the importance of using oxygen as  prescribed, and the risks of noncompliance. There is a brief discussion on traveling with oxygen and resources the patient may utilize.   Oxygen Equipment:  -Group instruction provided by [facility] Staff utilizing handouts, written materials, and equipment demonstrations.   Signs and Symptoms:  -Group instruction provided by written material and verbal discussion to support subject matter. Warning signs and symptoms of infection, stroke, and heart attack are reviewed and when to call the physician/911 reinforced. Tips for preventing the spread of infection discussed.   Advanced Directives:  -Group instruction provided by verbal instruction and written material to support subject matter. Instructor reviews Advanced Directive laws and proper instruction for filling out document.   Pulmonary Video:  -Group video education that reviews the importance of medication and oxygen compliance, exercise, good nutrition, pulmonary hygiene, and pursed lip and diaphragmatic breathing for the pulmonary patient.   Exercise for the Pulmonary Patient:  -Group instruction that is supported by a PowerPoint presentation. Instructor discusses benefits of exercise, core components of exercise, frequency, duration, and intensity of an exercise routine, importance of utilizing pulse oximetry during exercise, safety while exercising, and options of places to exercise outside of rehab.     Pulmonary Medications:  -Verbally interactive group education provided by instructor with focus on inhaled medications and proper administration.   Anatomy and Physiology of the Respiratory System and Intimacy:  -Group instruction provided by PowerPoint, verbal discussion, and written material to support subject matter. Instructor reviews respiratory cycle and anatomical components of the respiratory system and their functions. Instructor also reviews differences in obstructive and restrictive respiratory diseases with examples  of each. Intimacy, Sex, and Sexuality differences are reviewed with a discussion on how relationships can change when diagnosed with pulmonary disease. Common sexual concerns are reviewed.   Knowledge Questionnaire Score:     Knowledge Questionnaire Score - 07/28/16 1155      Knowledge Questionnaire Score   Pre Score 12/13      Core Components/Risk Factors/Patient Goals at Admission:     Personal Goals and Risk Factors at Admission - 07/23/16 1052      Core Components/Risk Factors/Patient Goals on Admission    Weight Management Yes;Weight Loss   Intervention Weight Management: Develop a combined nutrition and exercise program designed to reach desired caloric intake, while maintaining appropriate intake of nutrient and fiber, sodium and fats, and appropriate energy expenditure required for the weight goal.;Weight Management: Provide education and appropriate resources to help participant work on and attain dietary goals.   Expected Outcomes Short Term: Continue to assess and modify interventions until short term weight is achieved;Understanding of distribution of calorie intake throughout the day with the consumption of 4-5 meals/snacks;Understanding recommendations for meals to include 15-35% energy as protein, 25-35% energy from fat, 35-60% energy from carbohydrates, less than 200mg  of dietary cholesterol, 20-35 gm of total fiber  daily;Weight Loss: Understanding of general recommendations for a balanced deficit meal plan, which promotes 1-2 lb weight loss per week and includes a negative energy balance of [phone] kcal/d   Increase Strength and Stamina Yes   Intervention Provide advice, education, support and counseling about physical activity/exercise needs.;Develop an individualized exercise prescription for aerobic and resistive training based on initial evaluation findings, risk stratification, comorbidities and participant's personal goals.   Expected Outcomes Achievement of increased  cardiorespiratory fitness and enhanced flexibility, muscular endurance and strength shown through measurements of functional capacity and personal statement of participant.   Improve shortness of breath with ADL's Yes   Intervention Provide education, individualized exercise plan and daily activity instruction to help decrease symptoms of SOB with activities of daily living.   Expected Outcomes Short Term: Achieves a reduction of symptoms when performing activities of daily living.      Core Components/Risk Factors/Patient Goals Review:    Core Components/Risk Factors/Patient Goals at Discharge (Final Review):    ITP Comments:   Comments:

## 2016-08-05 ENCOUNTER — Encounter (HOSPITAL_COMMUNITY)
Admission: RE | Admit: 2016-08-05 | Discharge: 2016-08-05 | Disposition: A | Discharge status: Home / Self Care | Payer: Worker's Compensation | Source: Ambulatory Visit | Attending: Pulmonary Disease | Admitting: Pulmonary Disease

## 2016-08-05 VITALS — Wt 168.2 lb

## 2016-08-05 DIAGNOSIS — R0602 Shortness of breath: Secondary | ICD-10-CM | POA: Diagnosis not present

## 2016-08-05 DIAGNOSIS — I2699 Other pulmonary embolism without acute cor pulmonale: Secondary | ICD-10-CM

## 2016-08-05 NOTE — Progress Notes (Signed)
Daily Session Note  Patient Details  Name: Holly Bowen MRN: [id number] Date of Birth: May 28, 1956 Referring Provider:   April Manson Pulmonary Rehab Walk Test from 07/29/2016 in [place]  Referring Provider  Dr. Nelda Marseille      Encounter Date: 08/05/2016  Check In:     Session Check In - 08/05/16 1110      Check-In   Location [facility]   Staff Present Su Hilt, MS, ACSM RCEP, Exercise Physiologist;Joan Leonia Reeves, RN, Luisa Hart, RN, BSN   Supervising physician immediately available to respond to emergencies [facility] immediately available   Physician(s) Dr. Tana Coast   Medication changes reported     No   Fall or balance concerns reported    No   Tobacco Cessation No Change   Warm-up and Cool-down Performed as group-led instruction   Resistance Training Performed Yes   VAD Patient? No     Pain Assessment   Currently in Pain? No/denies      Capillary Blood Glucose: No results found for this or any previous visit (from the past 24 hour(s)).      Exercise Prescription Changes - 08/05/16 1200      Response to Exercise   Blood Pressure (Admit) 142/64   Blood Pressure (Exercise) 128/82   Blood Pressure (Exit) 122/70   Heart Rate (Admit) 96 bpm   Heart Rate (Exercise) 85 bpm   Heart Rate (Exit) 74 bpm   Oxygen Saturation (Admit) 96 %   Oxygen Saturation (Exercise) 97 %   Oxygen Saturation (Exit) 96 %   Rating of Perceived Exertion (Exercise) 11   Perceived Dyspnea (Exercise) 1   Duration Continue with 45 min of aerobic exercise without signs/symptoms of physical distress.   Intensity THRR unchanged     Progression   Progression Continue to progress workloads to maintain intensity without signs/symptoms of physical distress.     Resistance Training   Training Prescription Yes   Weight orange bands   Reps 10-15     Bike   Level 2   Minutes 17     Track   Laps 16   Minutes 17      History   Smoking Status  . Never Smoker  Smokeless Tobacco  . Never Used    Goals Met:  Exercise tolerated well No report of cardiac concerns or symptoms Strength training completed today  Goals Unmet:  Not Applicable  Comments: Service time is from 10:30a to 12:40p    Dr. Rush Farmer is Medical Director for Pulmonary Rehab at [facility].

## 2016-08-10 ENCOUNTER — Encounter (HOSPITAL_COMMUNITY)
Admission: RE | Admit: 2016-08-10 | Discharge: 2016-08-10 | Disposition: A | Discharge status: Home / Self Care | Payer: Worker's Compensation | Source: Ambulatory Visit | Attending: Pulmonary Disease | Admitting: Pulmonary Disease

## 2016-08-10 VITALS — Wt 169.8 lb

## 2016-08-10 DIAGNOSIS — I2699 Other pulmonary embolism without acute cor pulmonale: Secondary | ICD-10-CM

## 2016-08-10 DIAGNOSIS — R0602 Shortness of breath: Secondary | ICD-10-CM | POA: Diagnosis not present

## 2016-08-10 NOTE — Progress Notes (Signed)
Pulmonary Individual Treatment Plan  Patient Details  Name: Holly Bowen MRN: [id number] Date of Birth: 1956-04-28 Referring Provider:   April Manson Pulmonary Rehab Walk Test from 07/29/2016 in [place]  Referring Provider  Dr. Nelda Marseille      Initial Encounter Date:  Flowsheet Row Pulmonary Rehab Walk Test from 07/29/2016 in [place]  Date  07/29/16  Referring Provider  Dr. Nelda Marseille      Visit Diagnosis: Other acute pulmonary embolism without acute cor pulmonale ([place])  Patient's Home Medications on Admission:   Current Outpatient Prescriptions:  .  acetaminophen (TYLENOL) 325 MG tablet, Take 650 mg by mouth., Disp: , Rfl:  .  busPIRone (BUSPAR) 30 MG tablet, Take by mouth., Disp: , Rfl:  .  Cyanocobalamin 1000 MCG/ML LIQD, Take by mouth., Disp: , Rfl:  .  lisinopril-hydrochlorothiazide (PRINZIDE) 20-12.5 MG per tablet, Take by mouth., Disp: , Rfl:  .  metoprolol (LOPRESSOR) 50 MG tablet, Take 50 mg by mouth., Disp: , Rfl:  .  Misc. Devices MISC, Patient is still unable to return to work at this time., Disp: , Rfl:  .  oxycodone (OXY-IR) 5 MG capsule, Take 5 mg by mouth., Disp: , Rfl:  .  oxyCODONE (OXYCONTIN) 10 mg 12 hr tablet, Take by mouth., Disp: , Rfl:  .  risperiDONE (RISPERDAL) 0.25 MG tablet, Take 0.25 mg by mouth. Patient only takes 1 tab at hour of sleep, Disp: , Rfl:  .  rivaroxaban (XARELTO) 20 MG TABS tablet, Take by mouth., Disp: , Rfl:   Past Medical History: Past Medical History:  Diagnosis Date  . Hypertension     Tobacco Use: History  Smoking Status  . Never Smoker  Smokeless Tobacco  . Never Used    Labs: Recent Review Flowsheet Data    Labs for ITP Cardiac and Pulmonary Rehab Latest Ref Rng & Units 08/20/2008 10/13/2009   TCO2 0 - 100 mmol/L 26 27      Capillary Blood Glucose: No results found for: GLUCAP   ADL UCSD:     Pulmonary Assessment Scores    Row Name 07/28/16 1155          ADL UCSD   ADL Phase Entry     SOB Score total 38        Pulmonary Function Assessment:     Pulmonary Function Assessment - 07/23/16 1051      Breath   Bilateral Breath Sounds Clear   Shortness of Breath Yes;Limiting activity      Exercise Target Goals:    Exercise Program Goal: Individual exercise prescription set with THRR, safety & activity barriers. Participant demonstrates ability to understand and report RPE using BORG scale, to self-measure pulse accurately, and to acknowledge the importance of the exercise prescription.  Exercise Prescription Goal: Starting with aerobic activity 30 plus minutes a day, 3 days per week for initial exercise prescription. Provide home exercise prescription and guidelines that participant acknowledges understanding prior to discharge.  Activity Barriers & Risk Stratification:     Activity Barriers & Cardiac Risk Stratification - 07/23/16 1050      Activity Barriers & Cardiac Risk Stratification   Activity Barriers Back Problems;Shortness of Breath;Deconditioning;Other (comment)   Comments chronic pain      6 Minute Walk:     6 Minute Walk    Row Name 07/29/16 1657         6 Minute Walk   Phase Initial     Distance  1490 feet     Walk Time 6 minutes     # of Rest Breaks 0     MPH 2.82     METS 3.14     RPE 11     Perceived Dyspnea  1     Symptoms No     Resting HR 77 bpm     Resting BP 132/88     Max Ex. HR 100 bpm     Max Ex. BP 160/100     2 Minute Post BP 155/99       Interval HR   Baseline HR 77     1 Minute HR 89     2 Minute HR 95     3 Minute HR 97     4 Minute HR 99     5 Minute HR 98     6 Minute HR 100     2 Minute Post HR 81     Interval Heart Rate? Yes       Interval Oxygen   Interval Oxygen? Yes     Baseline Oxygen Saturation % 95 %     Baseline Liters of Oxygen 0 L     1 Minute Oxygen Saturation % 93 %     1 Minute Liters of Oxygen 0 L     2 Minute Oxygen Saturation % 92 %     2  Minute Liters of Oxygen 0 L     3 Minute Oxygen Saturation % 93 %     3 Minute Liters of Oxygen 0 L     4 Minute Oxygen Saturation % 93 %     4 Minute Liters of Oxygen 0 L     5 Minute Oxygen Saturation % 94 %     5 Minute Liters of Oxygen 0 L     6 Minute Oxygen Saturation % 94 %     6 Minute Liters of Oxygen 0 L     2 Minute Post Oxygen Saturation % 96 %     2 Minute Post Liters of Oxygen 0 L        Oxygen Initial Assessment:     Oxygen Initial Assessment - 08/09/16 1157      Home Oxygen   Home Oxygen Device None     Initial 6 min Walk   Oxygen Used None     Program Oxygen Prescription   Program Oxygen Prescription None      Oxygen Re-Evaluation:   Oxygen Discharge (Final Oxygen Re-Evaluation):   Initial Exercise Prescription:     Initial Exercise Prescription - 07/29/16 1700      Date of Initial Exercise RX and Referring Provider   Date 07/29/16   Referring Provider Dr. Nelda Marseille     Bike   Level 2   Minutes 17     NuStep   Level 2   Minutes 17   METs 1.5     Track   Laps 8   Minutes 17     Prescription Details   Frequency (times per week) 2   Duration Progress to 45 minutes of aerobic exercise without signs/symptoms of physical distress     Intensity   THRR 40-80% of Max Heartrate 63-126   Ratings of Perceived Exertion 11-13   Perceived Dyspnea 0-4     Progression   Progression Continue progressive overload as per policy without signs/symptoms or physical distress.     Resistance Training   Training Prescription Yes   Weight  orange bands   Reps 10-15      Perform Capillary Blood Glucose checks as needed.  Exercise Prescription Changes:     Exercise Prescription Changes    Row Name 08/05/16 1200             Response to Exercise   Blood Pressure (Admit) 142/64       Blood Pressure (Exercise) 128/82       Blood Pressure (Exit) 122/70       Heart Rate (Admit) 96 bpm       Heart Rate (Exercise) 85 bpm       Heart Rate (Exit)  74 bpm       Oxygen Saturation (Admit) 96 %       Oxygen Saturation (Exercise) 97 %       Oxygen Saturation (Exit) 96 %       Rating of Perceived Exertion (Exercise) 11       Perceived Dyspnea (Exercise) 1       Duration Continue with 45 min of aerobic exercise without signs/symptoms of physical distress.       Intensity THRR unchanged         Progression   Progression Continue to progress workloads to maintain intensity without signs/symptoms of physical distress.         Resistance Training   Training Prescription Yes       Weight orange bands       Reps 10-15         Bike   Level 2       Minutes 17         Track   Laps 16       Minutes 17          Exercise Comments:   Exercise Goals and Review:   Exercise Goals Re-Evaluation :     Exercise Goals Re-Evaluation    Row Name 08/10/16 0805             Exercise Goal Re-Evaluation   Exercise Goals Review Increase Physical Activity;Increase Strenth and Stamina       Comments Patient has only attended one exercise sessions. Will cont. to monitor and progress.        Expected Outcomes Through exercise at rehab and home patient will increase physical capacity, stength, and stamina.          Discharge Exercise Prescription (Final Exercise Prescription Changes):     Exercise Prescription Changes - 08/05/16 1200      Response to Exercise   Blood Pressure (Admit) 142/64   Blood Pressure (Exercise) 128/82   Blood Pressure (Exit) 122/70   Heart Rate (Admit) 96 bpm   Heart Rate (Exercise) 85 bpm   Heart Rate (Exit) 74 bpm   Oxygen Saturation (Admit) 96 %   Oxygen Saturation (Exercise) 97 %   Oxygen Saturation (Exit) 96 %   Rating of Perceived Exertion (Exercise) 11   Perceived Dyspnea (Exercise) 1   Duration Continue with 45 min of aerobic exercise without signs/symptoms of physical distress.   Intensity THRR unchanged     Progression   Progression Continue to progress workloads to maintain intensity without  signs/symptoms of physical distress.     Resistance Training   Training Prescription Yes   Weight orange bands   Reps 10-15     Bike   Level 2   Minutes 17     Track   Laps 16   Minutes 17      Nutrition:  Target Goals: Understanding of nutrition guidelines, daily intake of sodium 1500mg , cholesterol 200mg , calories 30% from fat and 7% or less from saturated fats, daily to have 5 or more servings of fruits and vegetables.  Biometrics:     Pre Biometrics - 07/23/16 1059      Pre Biometrics   Grip Strength 22 kg       Nutrition Therapy Plan and Nutrition Goals:   Nutrition Discharge: Rate Your Plate Scores:   Nutrition Goals Re-Evaluation:   Nutrition Goals Discharge (Final Nutrition Goals Re-Evaluation):   Psychosocial: Target Goals: Acknowledge presence or absence of significant depression and/or stress, maximize coping skills, provide positive support system. Participant is able to verbalize types and ability to use techniques and skills needed for reducing stress and depression.  Initial Review & Psychosocial Screening:     Initial Psych Review & Screening - 07/23/16 1053      Initial Review   Current issues with Current Stress Concerns   Source of Stress Concerns Chronic Illness;Unable to participate in former interests or hobbies;Unable to perform yard/household activities     [place]? Yes     Barriers   Psychosocial barriers to participate in program There are no identifiable barriers or psychosocial needs.     Screening Interventions   Interventions Encouraged to exercise      Quality of Life Scores:     Quality of Life - 07/28/16 1155      Quality of Life Scores   Health/Function Pre 14.07 %   Socioeconomic Pre 22.29 %   Psych/Spiritual Pre 20.75 %   Family Pre 21.83 %   GLOBAL Pre 18.1 %      PHQ-9: Recent Review Flowsheet Data    Depression screen [facility] 2/9 07/23/2016 08/08/2014 03/08/2014    Decreased Interest 1 2 3    Down, Depressed, Hopeless 0 0 3   PHQ - 2 Score 1 2 6    Altered sleeping - 0 3    Tired, decreased energy - 3 3   Change in appetite - 0 3    Feeling bad or failure about yourself  - 0 0   Trouble concentrating - 0 0   Moving slowly or fidgety/restless - 0 1    Suicidal thoughts - 0 0   PHQ-9 Score - 5 16     Interpretation of Total Score  Total Score Depression Severity:  1-4 = Minimal depression, 5-9 = Mild depression, 10-14 = Moderate depression, 15-19 = Moderately severe depression, 20-27 = Severe depression   Psychosocial Evaluation and Intervention:     Psychosocial Evaluation - 08/09/16 1159      Psychosocial Evaluation & Interventions   Interventions Encouraged to exercise with the program and follow exercise prescription   Continue Psychosocial Services  Follow up required by staff      Psychosocial Re-Evaluation:     Psychosocial Re-Evaluation    [place] Name 08/09/16 1159             Psychosocial Re-Evaluation   Current issues with Current Stress Concerns       Interventions Encouraged to attend Pulmonary Rehabilitation for the exercise;Stress management education       Continue Psychosocial Services  Follow up required by staff          Psychosocial Discharge (Final Psychosocial Re-Evaluation):     Psychosocial Re-Evaluation - 08/09/16 1159      Psychosocial Re-Evaluation   Current issues with Current Stress Concerns   Interventions Encouraged  to attend Pulmonary Rehabilitation for the exercise;Stress management education   Continue Psychosocial Services  Follow up required by staff      Education: Education Goals: Education classes will be provided on a weekly basis, covering required topics. Participant will state understanding/return demonstration of topics presented.  Learning Barriers/Preferences:     Learning Barriers/Preferences - 07/23/16 1050      Learning Barriers/Preferences   Learning Barriers None    Learning Preferences Audio;Computer/Internet;Group Instruction;Individual Instruction;Pictoral;Skilled Demonstration;Verbal Instruction;Video;Written Material      Education Topics: Risk Factor Reduction:  -Group instruction that is supported by a PowerPoint presentation. Instructor discusses the definition of a risk factor, different risk factors for pulmonary disease, and how the heart and lungs work together.     Nutrition for Pulmonary Patient:  -Group instruction provided by PowerPoint slides, verbal discussion, and written materials to support subject matter. The instructor gives an explanation and review of healthy diet recommendations, which includes a discussion on weight management, recommendations for fruit and vegetable consumption, as well as protein, fluid, caffeine, fiber, sodium, sugar, and alcohol. Tips for eating when patients are short of breath are discussed.   Pursed Lip Breathing:  -Group instruction that is supported by demonstration and informational handouts. Instructor discusses the benefits of pursed lip and diaphragmatic breathing and detailed demonstration on how to preform both.     Oxygen Safety:  -Group instruction provided by PowerPoint, verbal discussion, and written material to support subject matter. There is an overview of "What is Oxygen" and "Why do we need it".  Instructor also reviews how to create a safe environment for oxygen use, the importance of using oxygen as prescribed, and the risks of noncompliance. There is a brief discussion on traveling with oxygen and resources the patient may utilize.   Oxygen Equipment:  -Group instruction provided by [facility] Staff utilizing handouts, written materials, and equipment demonstrations.   Signs and Symptoms:  -Group instruction provided by written material and verbal discussion to support subject matter. Warning signs and symptoms of infection, stroke, and heart attack are reviewed and when to call  the physician/911 reinforced. Tips for preventing the spread of infection discussed.   Advanced Directives:  -Group instruction provided by verbal instruction and written material to support subject matter. Instructor reviews Advanced Directive laws and proper instruction for filling out document.   Pulmonary Video:  -Group video education that reviews the importance of medication and oxygen compliance, exercise, good nutrition, pulmonary hygiene, and pursed lip and diaphragmatic breathing for the pulmonary patient.   Exercise for the Pulmonary Patient:  -Group instruction that is supported by a PowerPoint presentation. Instructor discusses benefits of exercise, core components of exercise, frequency, duration, and intensity of an exercise routine, importance of utilizing pulse oximetry during exercise, safety while exercising, and options of places to exercise outside of rehab.     Pulmonary Medications:  -Verbally interactive group education provided by instructor with focus on inhaled medications and proper administration.   Anatomy and Physiology of the Respiratory System and Intimacy:  -Group instruction provided by PowerPoint, verbal discussion, and written material to support subject matter. Instructor reviews respiratory cycle and anatomical components of the respiratory system and their functions. Instructor also reviews differences in obstructive and restrictive respiratory diseases with examples of each. Intimacy, Sex, and Sexuality differences are reviewed with a discussion on how relationships can change when diagnosed with pulmonary disease. Common sexual concerns are reviewed. Flowsheet Row PULMONARY REHAB OTHER RESPIRATORY from 08/05/2016 in [place]  REHAB  Date  07/29/16  Educator  Tailyn Hantz  Instruction Review Code  2- meets goals/outcomes      Knowledge Questionnaire Score:     Knowledge Questionnaire Score - 07/28/16 1155      Knowledge  Questionnaire Score   Pre Score 12/13      Core Components/Risk Factors/Patient Goals at Admission:     Personal Goals and Risk Factors at Admission - 07/23/16 1052      Core Components/Risk Factors/Patient Goals on Admission    Weight Management Yes;Weight Loss   Intervention Weight Management: Develop a combined nutrition and exercise program designed to reach desired caloric intake, while maintaining appropriate intake of nutrient and fiber, sodium and fats, and appropriate energy expenditure required for the weight goal.;Weight Management: Provide education and appropriate resources to help participant work on and attain dietary goals.   Expected Outcomes Short Term: Continue to assess and modify interventions until short term weight is achieved;Understanding of distribution of calorie intake throughout the day with the consumption of 4-5 meals/snacks;Understanding recommendations for meals to include 15-35% energy as protein, 25-35% energy from fat, 35-60% energy from carbohydrates, less than 200mg  of dietary cholesterol, 20-35 gm of total fiber daily;Weight Loss: Understanding of general recommendations for a balanced deficit meal plan, which promotes 1-2 lb weight loss per week and includes a negative energy balance of [phone] kcal/d   Increase Strength and Stamina Yes   Intervention Provide advice, education, support and counseling about physical activity/exercise needs.;Develop an individualized exercise prescription for aerobic and resistive training based on initial evaluation findings, risk stratification, comorbidities and participant's personal goals.   Expected Outcomes Achievement of increased cardiorespiratory fitness and enhanced flexibility, muscular endurance and strength shown through measurements of functional capacity and personal statement of participant.   Improve shortness of breath with ADL's Yes   Intervention Provide education, individualized exercise plan and daily  activity instruction to help decrease symptoms of SOB with activities of daily living.   Expected Outcomes Short Term: Achieves a reduction of symptoms when performing activities of daily living.      Core Components/Risk Factors/Patient Goals Review:      Goals and Risk Factor Review    Row Name 08/09/16 1158             Core Components/Risk Factors/Patient Goals Review   Personal Goals Review Weight Management/Obesity;Improve shortness of breath with ADL's       Review see "comments" section on ITP       Expected Outcomes see admission expected outcomes          Core Components/Risk Factors/Patient Goals at Discharge (Final Review):      Goals and Risk Factor Review - 08/09/16 1158      Core Components/Risk Factors/Patient Goals Review   Personal Goals Review Weight Management/Obesity;Improve shortness of breath with ADL's   Review see "comments" section on ITP   Expected Outcomes see admission expected outcomes      ITP Comments:   Comments: Horris Latino has attended 1 exercise session since admission. It is too early to determine progression towards goals. It is expected that patient will progress towards goals established at admission over the next 30 days.

## 2016-08-10 NOTE — Progress Notes (Signed)
Daily Session Note  Patient Details  Name: Holly Bowen MRN: [id number] Date of Birth: February 14, 1956 Referring Provider:   April Manson Pulmonary Rehab Walk Test from 07/29/2016 in [place]  Referring Provider  Dr. Nelda Marseille      Encounter Date: 08/10/2016  Check In:     Session Check In - 08/10/16 1229      Check-In   Location [facility]   Staff Present Su Hilt, MS, ACSM RCEP, Exercise Physiologist;Olinty Wabasso, MS, ACSM CEP, Exercise Physiologist;Maria Whitaker, RN, Roque Cash, RN   Supervising physician immediately available to respond to emergencies [facility] immediately available   Physician(s) Dr Maylene Roes   Medication changes reported     No   Fall or balance concerns reported    No   Tobacco Cessation No Change   Warm-up and Cool-down Performed as group-led instruction   Resistance Training Performed Yes   VAD Patient? No     Pain Assessment   Currently in Pain? No/denies      Capillary Blood Glucose: No results found for this or any previous visit (from the past 24 hour(s)).      Exercise Prescription Changes - 08/10/16 1200      Response to Exercise   Blood Pressure (Admit) 128/80   Blood Pressure (Exercise) 122/66   Blood Pressure (Exit) 122/70   Heart Rate (Admit) 67 bpm   Heart Rate (Exercise) 76 bpm   Heart Rate (Exit) 62 bpm   Oxygen Saturation (Admit) 95 %   Oxygen Saturation (Exercise) 95 %   Oxygen Saturation (Exit) 96 %   Rating of Perceived Exertion (Exercise) 11   Perceived Dyspnea (Exercise) 1   Duration Progress to 45 minutes of aerobic exercise without signs/symptoms of physical distress   Intensity THRR unchanged     Progression   Progression Continue to progress workloads to maintain intensity without signs/symptoms of physical distress.     Resistance Training   Training Prescription Yes   Weight orange bands   Reps 10-15   Time 10 Minutes     Bike   Level 2   Minutes 17     NuStep   Level 3   Minutes 17     Track   Laps 14   Minutes 17      History  Smoking Status  . Never Smoker  Smokeless Tobacco  . Never Used    Goals Met:  Exercise tolerated well No report of cardiac concerns or symptoms Strength training completed today  Goals Unmet:  Not Applicable  Comments: Service time is from 1030 to 1210    Dr. Rush Farmer is Medical Director for Pulmonary Rehab at [facility].

## 2016-08-12 ENCOUNTER — Encounter (HOSPITAL_COMMUNITY)
Admission: RE | Admit: 2016-08-12 | Discharge: 2016-08-12 | Disposition: A | Discharge status: Home / Self Care | Payer: Worker's Compensation | Source: Ambulatory Visit | Attending: Pulmonary Disease | Admitting: Pulmonary Disease

## 2016-08-12 VITALS — Wt 169.5 lb

## 2016-08-12 DIAGNOSIS — I2699 Other pulmonary embolism without acute cor pulmonale: Secondary | ICD-10-CM

## 2016-08-12 DIAGNOSIS — R0602 Shortness of breath: Secondary | ICD-10-CM | POA: Diagnosis not present

## 2016-08-12 NOTE — Progress Notes (Signed)
Daily Session Note  Patient Details  Name: Holly Bowen MRN: [id number] Date of Birth: 11/22/1955 Referring Provider:     Pulmonary Rehab Walk Test from 07/29/2016 in [facility]  Referring Provider  Dr. Yacoub      Encounter Date: 08/12/2016  Check In:     Session Check In - 08/12/16 1017      Check-In   Location [facility]   Staff Present Portia Payne, RN, BSN;Molly diVincenzo, MS, ACSM RCEP, Exercise Physiologist;Joan Behrens, RN, BSN; , RN   Supervising physician immediately available to respond to emergencies [facility] immediately available   Physician(s) Dr. Ghimire   Medication changes reported     No   Fall or balance concerns reported    No   Tobacco Cessation No Change   Warm-up and Cool-down Performed as group-led instruction   Resistance Training Performed Yes   VAD Patient? No     Pain Assessment   Currently in Pain? No/denies   Multiple Pain Sites No      Capillary Blood Glucose: No results found for this or any previous visit (from the past 24 hour(s)).      Exercise Prescription Changes - 08/12/16 1300      Response to Exercise   Blood Pressure (Admit) 130/70   Blood Pressure (Exercise) 142/80   Blood Pressure (Exit) 124/70   Heart Rate (Admit) 75 bpm   Heart Rate (Exercise) 80 bpm   Heart Rate (Exit) 71 bpm   Oxygen Saturation (Admit) 98 %   Oxygen Saturation (Exercise) 96 %   Oxygen Saturation (Exit) 96 %   Rating of Perceived Exertion (Exercise) 9   Perceived Dyspnea (Exercise) 1   Duration Progress to 45 minutes of aerobic exercise without signs/symptoms of physical distress   Intensity THRR unchanged     Progression   Progression Continue to progress workloads to maintain intensity without signs/symptoms of physical distress.     Resistance Training   Training Prescription Yes   Weight orange bands   Reps 10-15   Time 10 Minutes     Bike   Level 2   Minutes 17     NuStep   Level 3   Minutes 17   METs 2.3      History  Smoking Status  . Never Smoker  Smokeless Tobacco  . Never Used    Goals Met:  Exercise tolerated well No report of cardiac concerns or symptoms Strength training completed today  Goals Unmet:  Not Applicable  Comments: Service time is from 1030 to 1245    Dr. Wesam G. Yacoub is Medical Director for Pulmonary Rehab at [facility]. 

## 2016-08-17 ENCOUNTER — Encounter (HOSPITAL_COMMUNITY)
Admission: RE | Admit: 2016-08-17 | Discharge: 2016-08-17 | Disposition: A | Discharge status: Home / Self Care | Payer: Worker's Compensation | Source: Ambulatory Visit | Attending: Pulmonary Disease | Admitting: Pulmonary Disease

## 2016-08-17 VITALS — Wt 171.7 lb

## 2016-08-17 DIAGNOSIS — I2699 Other pulmonary embolism without acute cor pulmonale: Secondary | ICD-10-CM

## 2016-08-17 DIAGNOSIS — R0602 Shortness of breath: Secondary | ICD-10-CM | POA: Diagnosis not present

## 2016-08-17 NOTE — Progress Notes (Signed)
Daily Session Note  Patient Details  Name: Holly Bowen MRN: [id number] Date of Birth: 1955-06-11 Referring Provider:     Pulmonary Rehab Walk Test from 07/29/2016 in [place]  Referring Provider  Dr. Nelda Marseille      Encounter Date: 08/17/2016  Check In:     Session Check In - 08/17/16 1213      Check-In   Location [facility]   Staff Present Su Hilt, MS, ACSM RCEP, Exercise Physiologist;Joan Leonia Reeves, RN, BSN;Lisa Hughes, RN;Marvette Schamp Rollene Rotunda, RN, BSN   Supervising physician immediately available to respond to emergencies [facility] immediately available   Physician(s) Dr. Candiss Norse   Medication changes reported     No   Fall or balance concerns reported    No   Tobacco Cessation No Change   Warm-up and Cool-down Performed as group-led instruction   Resistance Training Performed Yes   VAD Patient? No     Pain Assessment   Currently in Pain? No/denies   Multiple Pain Sites No      Capillary Blood Glucose: No results found for this or any previous visit (from the past 24 hour(s)).      Exercise Prescription Changes - 08/17/16 1225      Response to Exercise   Blood Pressure (Admit) 124/74   Blood Pressure (Exercise) 160/80   Blood Pressure (Exit) 130/76   Heart Rate (Admit) 69 bpm   Heart Rate (Exercise) 84 bpm   Heart Rate (Exit) 57 bpm   Oxygen Saturation (Admit) 95 %   Oxygen Saturation (Exercise) 95 %   Oxygen Saturation (Exit) 95 %   Rating of Perceived Exertion (Exercise) 14   Perceived Dyspnea (Exercise) 1   Duration Progress to 45 minutes of aerobic exercise without signs/symptoms of physical distress   Intensity THRR unchanged     Progression   Progression Continue to progress workloads to maintain intensity without signs/symptoms of physical distress.     Resistance Training   Training Prescription Yes   Weight orange bands   Reps 10-15   Time 10 Minutes     Bike   Level 2   Minutes 17     NuStep   Level 5   Minutes 17   METs 2.4     Track   Laps 16   Minutes 17      History  Smoking Status  . Never Smoker  Smokeless Tobacco  . Never Used    Goals Met:  Independence with exercise equipment Using PLB without cueing & demonstrates good technique Exercise tolerated well No report of cardiac concerns or symptoms Strength training completed today  Goals Unmet:  Not Applicable  Comments: Service time is from 1030 to 1200   Dr. Rush Farmer is Medical Director for Pulmonary Rehab at [facility].

## 2016-08-19 ENCOUNTER — Encounter (HOSPITAL_COMMUNITY)
Admission: RE | Admit: 2016-08-19 | Discharge: 2016-08-19 | Disposition: A | Discharge status: Home / Self Care | Payer: Worker's Compensation | Source: Ambulatory Visit | Attending: Pulmonary Disease | Admitting: Pulmonary Disease

## 2016-08-19 VITALS — Ht 66.0 in | Wt 172.0 lb

## 2016-08-19 DIAGNOSIS — I2699 Other pulmonary embolism without acute cor pulmonale: Secondary | ICD-10-CM

## 2016-08-19 DIAGNOSIS — R0602 Shortness of breath: Secondary | ICD-10-CM | POA: Diagnosis not present

## 2016-08-19 NOTE — Progress Notes (Signed)
Holly Bowen 61 y.o. female Nutrition Note Spoke with pt. Pt is overweight. Pt states she eats out of boredom or depression "otherwise, I'm not hungry." Many of pt's interests have been limited/stopped due to her medical condition (e.g. Walking and gardening are hard right now). There are some ways the pt can make her eating habits healthier. Pt's primary areas for dietary improvement are scheduling meal and snack time and preventing eating out of boredom. Pt encouraged to schedule meals/snacks and stick to a schedule whether or not she feels hungry. Activities pt could participate in to help prevent boredom discussed. Pt's Rate Your Plate results reviewed with pt. Pt expressed understanding of the information reviewed via feedback method.    No results found for: HGBA1C  Nutrition Diagnosis ? Food-and nutrition-related knowledge deficit related to lack of exposure to information as related to diagnosis of pulmonary disease ? Overweight related to excessive energy intake as evidenced by a BMI of 27.4 Nutrition Intervention ? Pt's individual nutrition plan and goals reviewed with pt. ? Benefits of adopting healthy eating habits discussed when pt's Rate Your Plate reviewed. ? Pt to attend the Nutrition and Lung Disease class - met 08/12/16 ? Handout given for 1500 kcal, 5 day menu ideas ? Continual client-centered nutrition education by RD, as part of interdisciplinary care. Goal(s) 1. Identify food quantities necessary to achieve wt loss of  -2# per week to a goal wt loss of 2.7-10.9 kg (6-24 lb) at graduation from pulmonary rehab. 2. Pt to find alternatives to eating out of boredom (e.g. Tai Chi, meditation, helping others with gardening problems, going to [employer], walking in the park etc.) Monitor and Evaluate progress toward nutrition goal with team.   Derek Mound, M.Ed, RD, LDN, CDE 08/19/2016 12:26 PM

## 2016-08-19 NOTE — Progress Notes (Signed)
Daily Session Note  Patient Details  Name: Holly Bowen MRN: [id number] Date of Birth: Aug 03, 1955 Referring Provider:     Pulmonary Rehab Walk Test from 07/29/2016 in [place]  Referring Provider  Dr. Nelda Marseille      Encounter Date: 08/19/2016  Check In:     Session Check In - 08/19/16 1030      Check-In   Location [facility]   Staff Present Rodney Langton, RN;Portia Rollene Rotunda, RN, BSN;Riya Huxford, MS, ACSM RCEP, Exercise Physiologist   Supervising physician immediately available to respond to emergencies [facility] immediately available   Physician(s) Dr. Allyson Sabal   Medication changes reported     No   Fall or balance concerns reported    No   Tobacco Cessation No Change   Warm-up and Cool-down Performed as group-led instruction   Resistance Training Performed Yes   VAD Patient? No     Pain Assessment   Currently in Pain? No/denies   Multiple Pain Sites No      Capillary Blood Glucose: No results found for this or any previous visit (from the past 24 hour(s)).      Exercise Prescription Changes - 08/19/16 1200      Response to Exercise   Blood Pressure (Admit) 114/68   Blood Pressure (Exercise) 162/80   Blood Pressure (Exit) 122/72   Heart Rate (Admit) 72 bpm   Heart Rate (Exercise) 83 bpm   Heart Rate (Exit) 69 bpm   Oxygen Saturation (Admit) 97 %   Oxygen Saturation (Exercise) 94 %   Oxygen Saturation (Exit) 97 %   Rating of Perceived Exertion (Exercise) 11   Perceived Dyspnea (Exercise) 1   Duration Progress to 45 minutes of aerobic exercise without signs/symptoms of physical distress   Intensity THRR unchanged     Progression   Progression Continue to progress workloads to maintain intensity without signs/symptoms of physical distress.     Resistance Training   Training Prescription Yes   Weight orange bands   Reps 10-15   Time 10 Minutes     Bike   Level 3   Minutes 17     Track   Laps 16   Minutes 17      History  Smoking Status  . Never Smoker  Smokeless Tobacco  . Never Used    Goals Met:  Exercise tolerated well No report of cardiac concerns or symptoms Strength training completed today  Goals Unmet:  Not Applicable  Comments: Service time is from 10:30a to 12:20p    Dr. Rush Farmer is Medical Director for Pulmonary Rehab at [facility].

## 2016-08-24 ENCOUNTER — Encounter (HOSPITAL_COMMUNITY)
Admission: RE | Admit: 2016-08-24 | Discharge: 2016-08-24 | Disposition: A | Discharge status: Home / Self Care | Payer: Worker's Compensation | Source: Ambulatory Visit | Attending: Pulmonary Disease | Admitting: Pulmonary Disease

## 2016-08-24 VITALS — Wt 170.0 lb

## 2016-08-24 DIAGNOSIS — I2699 Other pulmonary embolism without acute cor pulmonale: Secondary | ICD-10-CM

## 2016-08-24 DIAGNOSIS — R0602 Shortness of breath: Secondary | ICD-10-CM | POA: Diagnosis not present

## 2016-08-24 NOTE — Progress Notes (Signed)
Daily Session Note  Patient Details  Name: Holly Bowen MRN: [id number] Date of Birth: 08-30-1955 Referring Provider:     Pulmonary Rehab Walk Test from 07/29/2016 in [place]  Referring Provider  Dr. Nelda Marseille      Encounter Date: 08/24/2016  Check In:     Session Check In - 08/24/16 1030      Check-In   Location [facility]   Staff Present Rosebud Poles, RN, BSN;Laquinda Moller, MS, ACSM RCEP, Exercise Physiologist;Annedrea Rosezella [place], RN, MHA;Portia Rollene Rotunda, RN, BSN   Supervising physician immediately available to respond to emergencies [facility] immediately available   Physician(s) Dr. Tana Coast   Medication changes reported     No   Fall or balance concerns reported    No   Tobacco Cessation No Change   Warm-up and Cool-down Performed as group-led instruction   Resistance Training Performed Yes   VAD Patient? No     Pain Assessment   Currently in Pain? No/denies   Multiple Pain Sites No      Capillary Blood Glucose: No results found for this or any previous visit (from the past 24 hour(s)).      Exercise Prescription Changes - 08/24/16 1200      Response to Exercise   Blood Pressure (Admit) 110/70   Blood Pressure (Exercise) 156/84   Blood Pressure (Exit) 122/70   Heart Rate (Admit) 75 bpm   Heart Rate (Exercise) 90 bpm   Heart Rate (Exit) 77 bpm   Oxygen Saturation (Admit) 95 %   Oxygen Saturation (Exercise) 96 %   Oxygen Saturation (Exit) 97 %   Rating of Perceived Exertion (Exercise) 13   Perceived Dyspnea (Exercise) 1   Duration Progress to 45 minutes of aerobic exercise without signs/symptoms of physical distress   Intensity THRR unchanged     Progression   Progression Continue to progress workloads to maintain intensity without signs/symptoms of physical distress.     Resistance Training   Training Prescription Yes   Weight orange bands   Reps 10-15   Time 10 Minutes     Bike   Level 4   Minutes  17     NuStep   Level 5   Minutes 17   METs 2.1     Track   Laps 15   Minutes 17      History  Smoking Status  . Never Smoker  Smokeless Tobacco  . Never Used    Goals Met:  Exercise tolerated well No report of cardiac concerns or symptoms Strength training completed today  Goals Unmet:  Not Applicable  Comments: Service time is from 10:30a to 12:05p    Dr. Rush Farmer is Medical Director for Pulmonary Rehab at [facility].

## 2016-08-26 ENCOUNTER — Encounter (HOSPITAL_COMMUNITY)
Admission: RE | Admit: 2016-08-26 | Discharge: 2016-08-26 | Disposition: A | Discharge status: Home / Self Care | Payer: Worker's Compensation | Source: Ambulatory Visit | Attending: Pulmonary Disease | Admitting: Pulmonary Disease

## 2016-08-26 VITALS — Wt 168.0 lb

## 2016-08-26 DIAGNOSIS — I2699 Other pulmonary embolism without acute cor pulmonale: Secondary | ICD-10-CM

## 2016-08-26 DIAGNOSIS — R0602 Shortness of breath: Secondary | ICD-10-CM | POA: Diagnosis not present

## 2016-08-26 NOTE — Progress Notes (Signed)
Daily Session Note  Patient Details  Name: Holly Bowen MRN: [id number] Date of Birth: 01-Apr-1956 Referring Provider:     Pulmonary Rehab Walk Test from 07/29/2016 in [place]  Referring Provider  Dr. Nelda Marseille      Encounter Date: 08/26/2016  Check In:     Session Check In - 08/26/16 1014      Check-In   Location [facility]   Staff Present Rosebud Poles, RN, BSN;Molly diVincenzo, MS, ACSM RCEP, Exercise Physiologist;Lisa Ysidro Evert, RN   Supervising physician immediately available to respond to emergencies [facility] immediately available   Physician(s) Dr. Sloan Leiter   Medication changes reported     No   Fall or balance concerns reported    No   Tobacco Cessation No Change   Warm-up and Cool-down Performed as group-led instruction   Resistance Training Performed Yes   VAD Patient? No     Pain Assessment   Currently in Pain? No/denies   Multiple Pain Sites No      Capillary Blood Glucose: No results found for this or any previous visit (from the past 24 hour(s)).      Exercise Prescription Changes - 08/26/16 1200      Response to Exercise   Blood Pressure (Admit) 120/64   Blood Pressure (Exercise) 128/60   Blood Pressure (Exit) 118/70   Heart Rate (Admit) 79 bpm   Heart Rate (Exercise) 96 bpm   Heart Rate (Exit) 75 bpm   Oxygen Saturation (Admit) 96 %   Oxygen Saturation (Exercise) 96 %   Oxygen Saturation (Exit) 96 %   Rating of Perceived Exertion (Exercise) 12   Perceived Dyspnea (Exercise) 1   Duration Progress to 45 minutes of aerobic exercise without signs/symptoms of physical distress   Intensity THRR unchanged     Progression   Progression Continue to progress workloads to maintain intensity without signs/symptoms of physical distress.     Resistance Training   Training Prescription Yes   Weight orange bands   Reps 10-15   Time 10 Minutes     Interval Training   Interval Training No     NuStep   Level 6   Minutes 17   METs 2.6     Track   Laps 16   Minutes 17      History  Smoking Status  . Never Smoker  Smokeless Tobacco  . Never Used    Goals Met:  Exercise tolerated well Strength training completed today  Goals Unmet:  Not Applicable  Comments: Service time is from 1030 to 1230    Dr. Rush Farmer is Medical Director for Pulmonary Rehab at [facility].

## 2016-09-02 ENCOUNTER — Encounter (HOSPITAL_COMMUNITY)
Admission: RE | Admit: 2016-09-02 | Discharge: 2016-09-02 | Disposition: A | Discharge status: Home / Self Care | Payer: Worker's Compensation | Source: Ambulatory Visit | Attending: Internal Medicine | Admitting: Internal Medicine

## 2016-09-02 DIAGNOSIS — I2699 Other pulmonary embolism without acute cor pulmonale: Secondary | ICD-10-CM

## 2016-09-02 DIAGNOSIS — R0602 Shortness of breath: Secondary | ICD-10-CM | POA: Insufficient documentation

## 2016-09-07 ENCOUNTER — Encounter (HOSPITAL_COMMUNITY)
Admission: RE | Admit: 2016-09-07 | Discharge: 2016-09-07 | Disposition: A | Discharge status: Home / Self Care | Payer: Worker's Compensation | Source: Ambulatory Visit | Attending: Internal Medicine | Admitting: Internal Medicine

## 2016-09-07 VITALS — Wt 166.0 lb

## 2016-09-07 DIAGNOSIS — I2699 Other pulmonary embolism without acute cor pulmonale: Secondary | ICD-10-CM

## 2016-09-07 DIAGNOSIS — R0602 Shortness of breath: Secondary | ICD-10-CM | POA: Diagnosis present

## 2016-09-07 NOTE — Progress Notes (Signed)
Daily Session Note  Patient Details  Name: Holly Bowen MRN: [id number] Date of Birth: 05/31/1955 Referring Provider:     Pulmonary Rehab Walk Test from 07/29/2016 in [facility]  Referring Provider  Dr. Yacoub      Encounter Date: 09/07/2016  Check In:     Session Check In - 09/07/16 1030      Check-In   Location [facility]   Staff Present  , RN, BSN;Molly diVincenzo, MS, ACSM RCEP, Exercise Physiologist;Lisa Hughes, RN;Portia Payne, RN, BSN   Supervising physician immediately available to respond to emergencies [facility] immediately available   Physician(s) Dr. Mikhail   Medication changes reported     No   Fall or balance concerns reported    No   Tobacco Cessation No Change   Warm-up and Cool-down Performed as group-led instruction   Resistance Training Performed Yes   VAD Patient? No     Pain Assessment   Currently in Pain? No/denies   Multiple Pain Sites No      Capillary Blood Glucose: No results found for this or any previous visit (from the past 24 hour(s)).      Exercise Prescription Changes - 09/07/16 1200      Response to Exercise   Blood Pressure (Admit) 116/64   Blood Pressure (Exercise) 140/74   Blood Pressure (Exit) 122/72   Heart Rate (Admit) 69 bpm   Heart Rate (Exercise) 94 bpm   Heart Rate (Exit) 71 bpm   Oxygen Saturation (Admit) 96 %   Oxygen Saturation (Exercise) 95 %   Oxygen Saturation (Exit) 96 %   Rating of Perceived Exertion (Exercise) 13   Perceived Dyspnea (Exercise) 2   Duration Progress to 45 minutes of aerobic exercise without signs/symptoms of physical distress   Intensity THRR unchanged     Progression   Progression Continue to progress workloads to maintain intensity without signs/symptoms of physical distress.     Resistance Training   Training Prescription Yes   Weight orange bands   Reps 10-15   Time 10 Minutes     Interval Training   Interval  Training No     Bike   Level 4   Minutes 17     NuStep   Level 6   Minutes 17   METs 2     Track   Laps 18   Minutes 17      History  Smoking Status  . Never Smoker  Smokeless Tobacco  . Never Used    Goals Met:  Proper associated with RPD/PD & O2 Sat Independence with exercise equipment Improved SOB with ADL's Exercise tolerated well Strength training completed today  Goals Unmet:  Not Applicable  Comments: Service time is from 1030 to 1200    Dr. Wesam G. Yacoub is Medical Director for Pulmonary Rehab at [facility]. 

## 2016-09-07 NOTE — Progress Notes (Signed)
I have reviewed a Home Exercise Prescription with Holly Bowen . Holly Bowen is not currently exercising at home.  The patient was advised to walk 2-3 days a week for 30 minutes.  Holly Bowen and I discussed how to progress their exercise prescription.  The patient stated that their goals were to be able to walk with friends and be less short of breath.  The patient stated that they understand the exercise prescription.  We reviewed exercise guidelines, target heart rate during exercise, oxygen use, weather, home pulse oximeter, endpoints for exercise, and goals.  Patient is encouraged to come to me with any questions. I will continue to follow up with the patient to assist them with progression and safety.

## 2016-09-07 NOTE — Progress Notes (Signed)
Pulmonary Individual Treatment Plan  Patient Details  Name: Holly Bowen MRN: [id number] Date of Birth: 08-28-1955 Referring Provider:     Pulmonary Rehab Walk Test from 07/29/2016 in [facility]  Referring Provider  Dr. Nelda Marseille      Initial Encounter Date:    Pulmonary Rehab Walk Test from 07/29/2016 in [facility]  Date  07/29/16  Referring Provider  Dr. Nelda Marseille      Visit Diagnosis: Other acute pulmonary embolism without acute cor pulmonale ([place])  Patient's Home Medications on Admission:   Current Outpatient Prescriptions:  .  acetaminophen (TYLENOL) 325 MG tablet, Take 650 mg by mouth., Disp: , Rfl:  .  busPIRone (BUSPAR) 30 MG tablet, Take by mouth., Disp: , Rfl:  .  Cyanocobalamin 1000 MCG/ML LIQD, Take by mouth., Disp: , Rfl:  .  lisinopril-hydrochlorothiazide (PRINZIDE) 20-12.5 MG per tablet, Take by mouth., Disp: , Rfl:  .  metoprolol (LOPRESSOR) 50 MG tablet, Take 50 mg by mouth., Disp: , Rfl:  .  Misc. Devices MISC, Patient is still unable to return to work at this time., Disp: , Rfl:  .  oxycodone (OXY-IR) 5 MG capsule, Take 5 mg by mouth., Disp: , Rfl:  .  oxyCODONE (OXYCONTIN) 10 mg 12 hr tablet, Take by mouth., Disp: , Rfl:  .  risperiDONE (RISPERDAL) 0.25 MG tablet, Take 0.25 mg by mouth. Patient only takes 1 tab at hour of sleep, Disp: , Rfl:  .  rivaroxaban (XARELTO) 20 MG TABS tablet, Take by mouth., Disp: , Rfl:   Past Medical History: Past Medical History:  Diagnosis Date  . Hypertension     Tobacco Use: History  Smoking Status  . Never Smoker  Smokeless Tobacco  . Never Used    Labs: Recent Review Flowsheet Data    Labs for ITP Cardiac and Pulmonary Rehab Latest Ref Rng & Units 08/20/2008 10/13/2009   TCO2 0 - 100 mmol/L 26 27      Capillary Blood Glucose: No results found for: GLUCAP   ADL UCSD:     Pulmonary Assessment Scores    Row Name 07/28/16 1155         ADL UCSD   ADL  Phase Entry     SOB Score total 38        Pulmonary Function Assessment:     Pulmonary Function Assessment - 07/23/16 1051      Breath   Bilateral Breath Sounds Clear   Shortness of Breath Yes;Limiting activity      Exercise Target Goals:    Exercise Program Goal: Individual exercise prescription set with THRR, safety & activity barriers. Participant demonstrates ability to understand and report RPE using BORG scale, to self-measure pulse accurately, and to acknowledge the importance of the exercise prescription.  Exercise Prescription Goal: Starting with aerobic activity 30 plus minutes a day, 3 days per week for initial exercise prescription. Provide home exercise prescription and guidelines that participant acknowledges understanding prior to discharge.  Activity Barriers & Risk Stratification:     Activity Barriers & Cardiac Risk Stratification - 07/23/16 1050      Activity Barriers & Cardiac Risk Stratification   Activity Barriers Back Problems;Shortness of Breath;Deconditioning;Other (comment)   Comments chronic pain      6 Minute Walk:     6 Minute Walk    Row Name 07/29/16 1657         6 Minute Walk   Phase Initial     Distance  1490 feet     Walk Time 6 minutes     # of Rest Breaks 0     MPH 2.82     METS 3.14     RPE 11     Perceived Dyspnea  1     Symptoms No     Resting HR 77 bpm     Resting BP 132/88     Max Ex. HR 100 bpm     Max Ex. BP 160/100     2 Minute Post BP 155/99       Interval HR   Baseline HR 77     1 Minute HR 89     2 Minute HR 95     3 Minute HR 97     4 Minute HR 99     5 Minute HR 98     6 Minute HR 100     2 Minute Post HR 81     Interval Heart Rate? Yes       Interval Oxygen   Interval Oxygen? Yes     Baseline Oxygen Saturation % 95 %     Baseline Liters of Oxygen 0 L     1 Minute Oxygen Saturation % 93 %     1 Minute Liters of Oxygen 0 L     2 Minute Oxygen Saturation % 92 %     2 Minute Liters of Oxygen 0 L      3 Minute Oxygen Saturation % 93 %     3 Minute Liters of Oxygen 0 L     4 Minute Oxygen Saturation % 93 %     4 Minute Liters of Oxygen 0 L     5 Minute Oxygen Saturation % 94 %     5 Minute Liters of Oxygen 0 L     6 Minute Oxygen Saturation % 94 %     6 Minute Liters of Oxygen 0 L     2 Minute Post Oxygen Saturation % 96 %     2 Minute Post Liters of Oxygen 0 L        Oxygen Initial Assessment:     Oxygen Initial Assessment - 08/09/16 1157      Home Oxygen   Home Oxygen Device None     Initial 6 min Walk   Oxygen Used None     Program Oxygen Prescription   Program Oxygen Prescription None      Oxygen Re-Evaluation:     Oxygen Re-Evaluation    Row Name 09/07/16 0718             Program Oxygen Prescription   Program Oxygen Prescription None         Home Oxygen   Home Oxygen Device None          Oxygen Discharge (Final Oxygen Re-Evaluation):     Oxygen Re-Evaluation - 09/07/16 0718      Program Oxygen Prescription   Program Oxygen Prescription None     Home Oxygen   Home Oxygen Device None      Initial Exercise Prescription:     Initial Exercise Prescription - 07/29/16 1700      Date of Initial Exercise RX and Referring Provider   Date 07/29/16   Referring Provider Dr. Nelda Marseille     Bike   Level 2   Minutes 17     NuStep   Level 2   Minutes 17   METs 1.5  Track   Laps 8   Minutes 17     Prescription Details   Frequency (times per week) 2   Duration Progress to 45 minutes of aerobic exercise without signs/symptoms of physical distress     Intensity   THRR 40-80% of Max Heartrate 63-126   Ratings of Perceived Exertion 11-13   Perceived Dyspnea 0-4     Progression   Progression Continue progressive overload as per policy without signs/symptoms or physical distress.     Resistance Training   Training Prescription Yes   Weight orange bands   Reps 10-15      Perform Capillary Blood Glucose checks as needed.  Exercise  Prescription Changes:     Exercise Prescription Changes    Row Name 08/05/16 1200 08/10/16 1200 08/12/16 1300 08/17/16 1225 08/19/16 1200     Response to Exercise   Blood Pressure (Admit) 142/64 128/80 130/70 124/74 114/68   Blood Pressure (Exercise) 128/82 122/66 142/80 160/80 162/80   Blood Pressure (Exit) 122/70 122/70 124/70 130/76 122/72   Heart Rate (Admit) 96 bpm 67 bpm 75 bpm 69 bpm 72 bpm   Heart Rate (Exercise) 85 bpm 76 bpm 80 bpm 84 bpm 83 bpm   Heart Rate (Exit) 74 bpm 62 bpm 71 bpm 57 bpm 69 bpm   Oxygen Saturation (Admit) 96 % 95 % 98 % 95 % 97 %   Oxygen Saturation (Exercise) 97 % 95 % 96 % 95 % 94 %   Oxygen Saturation (Exit) 96 % 96 % 96 % 95 % 97 %   Rating of Perceived Exertion (Exercise) 11 11 9 14 11    Perceived Dyspnea (Exercise) 1 1 1 1 1    Duration Continue with 45 min of aerobic exercise without signs/symptoms of physical distress. Progress to 45 minutes of aerobic exercise without signs/symptoms of physical distress Progress to 45 minutes of aerobic exercise without signs/symptoms of physical distress Progress to 45 minutes of aerobic exercise without signs/symptoms of physical distress Progress to 45 minutes of aerobic exercise without signs/symptoms of physical distress   Intensity THRR unchanged THRR unchanged THRR unchanged THRR unchanged THRR unchanged     Progression   Progression Continue to progress workloads to maintain intensity without signs/symptoms of physical distress. Continue to progress workloads to maintain intensity without signs/symptoms of physical distress. Continue to progress workloads to maintain intensity without signs/symptoms of physical distress. Continue to progress workloads to maintain intensity without signs/symptoms of physical distress. Continue to progress workloads to maintain intensity without signs/symptoms of physical distress.     Resistance Training   Training Prescription Yes Yes Yes Yes Yes   Weight orange bands orange  bands orange bands orange bands orange bands   Reps 10-15 10-15 10-15 10-15 10-15   Time  - 10 Minutes 10 Minutes 10 Minutes 10 Minutes     Bike   Level 2 2 2 2 3    Minutes 17 17 17 17 17      NuStep   Level  - 3 3 5   -   Minutes  - 17 17 17   -   METs  -  - 2.3 2.4  -     Track   Laps 16 14  - 16 16   Minutes 17 17  - 17 17   Row Name 08/24/16 1200 08/26/16 1200 09/07/16 1200         Response to Exercise   Blood Pressure (Admit) 110/70 120/64 116/64     Blood Pressure (Exercise) 156/84  128/60 140/74     Blood Pressure (Exit) 122/70 118/70 122/72     Heart Rate (Admit) 75 bpm 79 bpm 69 bpm     Heart Rate (Exercise) 90 bpm 96 bpm 94 bpm     Heart Rate (Exit) 77 bpm 75 bpm 71 bpm     Oxygen Saturation (Admit) 95 % 96 % 96 %     Oxygen Saturation (Exercise) 96 % 96 % 95 %     Oxygen Saturation (Exit) 97 % 96 % 96 %     Rating of Perceived Exertion (Exercise) 13 12 13      Perceived Dyspnea (Exercise) 1 1 2      Duration Progress to 45 minutes of aerobic exercise without signs/symptoms of physical distress Progress to 45 minutes of aerobic exercise without signs/symptoms of physical distress Progress to 45 minutes of aerobic exercise without signs/symptoms of physical distress     Intensity THRR unchanged THRR unchanged THRR unchanged       Progression   Progression Continue to progress workloads to maintain intensity without signs/symptoms of physical distress. Continue to progress workloads to maintain intensity without signs/symptoms of physical distress. Continue to progress workloads to maintain intensity without signs/symptoms of physical distress.       Resistance Training   Training Prescription Yes Yes Yes     Weight orange bands orange bands orange bands     Reps 10-15 10-15 10-15     Time 10 Minutes 10 Minutes 10 Minutes       Interval Training   Interval Training  - No No       Bike   Level 4  - 4     Minutes 17  - 17       NuStep   Level 5 6 6      Minutes 17  17 17      METs 2.1 2.6 2       Track   Laps 15 16 18      Minutes 17 17 17         Exercise Comments:   Exercise Goals and Review:   Exercise Goals Re-Evaluation :     Exercise Goals Re-Evaluation    Row Name 08/10/16 0805 09/07/16 1601 09/07/16 0715         Exercise Goal Re-Evaluation   Exercise Goals Review Increase Physical Activity;Increase Strenth and Stamina Increase Physical Activity;Increase Strenth and Stamina  -     Comments Patient has only attended one exercise sessions. Will cont. to monitor and progress.  Patient is progressing in physical capacity. She states that she is limited by rib pain. I educated the patient on the RPE (rate of percieved exertion) and asked her to always maintain work loads at [employer] level but all the while managing her rib pain. She agreed. Will cont to monitor and progress.  -     Expected Outcomes Through exercise at rehab and home patient will increase physical capacity, stength, and stamina.  - Through exercise at rehab and home patient will increase physical capacity, stength, and stamina.        Discharge Exercise Prescription (Final Exercise Prescription Changes):     Exercise Prescription Changes - 09/07/16 1200      Response to Exercise   Blood Pressure (Admit) 116/64   Blood Pressure (Exercise) 140/74   Blood Pressure (Exit) 122/72   Heart Rate (Admit) 69 bpm   Heart Rate (Exercise) 94 bpm   Heart Rate (Exit) 71 bpm   Oxygen Saturation (Admit)  96 %   Oxygen Saturation (Exercise) 95 %   Oxygen Saturation (Exit) 96 %   Rating of Perceived Exertion (Exercise) 13   Perceived Dyspnea (Exercise) 2   Duration Progress to 45 minutes of aerobic exercise without signs/symptoms of physical distress   Intensity THRR unchanged     Progression   Progression Continue to progress workloads to maintain intensity without signs/symptoms of physical distress.     Resistance Training   Training Prescription Yes   Weight orange  bands   Reps 10-15   Time 10 Minutes     Interval Training   Interval Training No     Bike   Level 4   Minutes 17     NuStep   Level 6   Minutes 17   METs 2     Track   Laps 18   Minutes 17      Nutrition:  Target Goals: Understanding of nutrition guidelines, daily intake of sodium 1500mg , cholesterol 200mg , calories 30% from fat and 7% or less from saturated fats, daily to have 5 or more servings of fruits and vegetables.  Biometrics:     Pre Biometrics - 08/19/16 1554      Pre Biometrics   Height 5\' 6"  (1.676 m)       Nutrition Therapy Plan and Nutrition Goals:     Nutrition Therapy & Goals - 08/17/16 1444      Nutrition Therapy   Diet Therapeutic Lifestyle Changes     Personal Nutrition Goals   Nutrition Goal Wt loss of 1-2 lb/week to a wt loss goal of 6-24 lb at graduation from [facility], educate and counsel regarding individualized specific dietary modifications aiming towards targeted core components such as weight, hypertension, lipid management, diabetes, heart failure and other comorbidities.   Expected Outcomes Short Term Goal: Understand basic principles of dietary content, such as calories, fat, sodium, cholesterol and nutrients.;Long Term Goal: Adherence to prescribed nutrition plan.      Nutrition Discharge: Rate Your Plate Scores:     Nutrition Assessments - 08/19/16 1226      Rate Your Plate Scores   Pre Score 53      Nutrition Goals Re-Evaluation:     Nutrition Goals Re-Evaluation    [place] Name 08/19/16 1236             Goals   Nutrition Goal Wt loss of 1-2 lb/week to a wt loss goal of 6-24 lb at graduation from [facility] given 1500 kcal, 5 day menu ideas to help with wt loss goal as well as help pt schedule meal time.          Nutrition Goals Discharge (Final Nutrition Goals Re-Evaluation):     Nutrition Goals Re-Evaluation - 08/19/16 1236       Goals   Nutrition Goal Wt loss of 1-2 lb/week to a wt loss goal of 6-24 lb at graduation from [facility] given 1500 kcal, 5 day menu ideas to help with wt loss goal as well as help pt schedule meal time.      Psychosocial: Target Goals: Acknowledge presence or absence of significant depression and/or stress, maximize coping skills, provide positive support system. Participant is able to verbalize types and ability to use techniques and skills needed for reducing stress and depression.  Initial Review & Psychosocial Screening:     Initial Psych  Review & Screening - 07/23/16 1053      Initial Review   Current issues with Current Stress Concerns   Source of Stress Concerns Chronic Illness;Unable to participate in former interests or hobbies;Unable to perform yard/household activities     [place]? Yes     Barriers   Psychosocial barriers to participate in program There are no identifiable barriers or psychosocial needs.     Screening Interventions   Interventions Encouraged to exercise      Quality of Life Scores:     Quality of Life - 07/28/16 1155      Quality of Life Scores   Health/Function Pre 14.07 %   Socioeconomic Pre 22.29 %   Psych/Spiritual Pre 20.75 %   Family Pre 21.83 %   GLOBAL Pre 18.1 %      PHQ-9: Recent Review Flowsheet Data    Depression screen [facility] 2/9 07/23/2016 08/08/2014 03/08/2014   Decreased Interest 1 2 3    Down, Depressed, Hopeless 0 0 3   PHQ - 2 Score 1 2 6    Altered sleeping - 0 3    Tired, decreased energy - 3 3   Change in appetite - 0 3    Feeling bad or failure about yourself  - 0 0   Trouble concentrating - 0 0   Moving slowly or fidgety/restless - 0 1    Suicidal thoughts - 0 0   PHQ-9 Score - 5 16     Interpretation of Total Score  Total Score Depression Severity:  1-4 = Minimal depression, 5-9 = Mild depression, 10-14 = Moderate depression, 15-19 = Moderately severe depression,  20-27 = Severe depression   Psychosocial Evaluation and Intervention:     Psychosocial Evaluation - 08/09/16 1159      Psychosocial Evaluation & Interventions   Interventions Encouraged to exercise with the program and follow exercise prescription   Continue Psychosocial Services  Follow up required by staff      Psychosocial Re-Evaluation:     Psychosocial Re-Evaluation    [place] Name 08/09/16 1159 09/07/16 0719           Psychosocial Re-Evaluation   Current issues with Current Stress Concerns Current Stress Concerns      Interventions Encouraged to attend Pulmonary Rehabilitation for the exercise;Stress management education Encouraged to attend Pulmonary Rehabilitation for the exercise;Stress management education      Continue Psychosocial Services  Follow up required by staff Follow up required by staff        Initial Review   Source of Stress Concerns  - Chronic Illness;Unable to participate in former interests or hobbies;Unable to perform yard/household activities         Psychosocial Discharge (Final Psychosocial Re-Evaluation):     Psychosocial Re-Evaluation - 09/07/16 0719      Psychosocial Re-Evaluation   Current issues with Current Stress Concerns   Interventions Encouraged to attend Pulmonary Rehabilitation for the exercise;Stress management education   Continue Psychosocial Services  Follow up required by staff     Initial Review   Source of Stress Concerns Chronic Illness;Unable to participate in former interests or hobbies;Unable to perform yard/household activities      Education: Education Goals: Education classes will be provided on a weekly basis, covering required topics. Participant will state understanding/return demonstration of topics presented.  Learning Barriers/Preferences:     Learning Barriers/Preferences - 07/23/16 1050      Learning Barriers/Preferences   Learning Barriers None   Learning  Preferences Audio;Computer/Internet;Group  Instruction;Individual Instruction;Pictoral;Skilled Demonstration;Verbal Instruction;Video;Written Material      Education Topics: Risk Factor Reduction:  -Group instruction that is supported by a PowerPoint presentation. Instructor discusses the definition of a risk factor, different risk factors for pulmonary disease, and how the heart and lungs work together.     PULMONARY REHAB OTHER RESPIRATORY from 09/02/2016 in [place]  Date  08/19/16  Educator  EP  Instruction Review Code  2- meets goals/outcomes      Nutrition for Pulmonary Patient:  -Group instruction provided by PowerPoint slides, verbal discussion, and written materials to support subject matter. The instructor gives an explanation and review of healthy diet recommendations, which includes a discussion on weight management, recommendations for fruit and vegetable consumption, as well as protein, fluid, caffeine, fiber, sodium, sugar, and alcohol. Tips for eating when patients are short of breath are discussed.   PULMONARY REHAB OTHER RESPIRATORY from 09/02/2016 in [place]  Date  08/12/16  Educator  RD  Instruction Review Code  2- meets goals/outcomes      Pursed Lip Breathing:  -Group instruction that is supported by demonstration and informational handouts. Instructor discusses the benefits of pursed lip and diaphragmatic breathing and detailed demonstration on how to preform both.     Oxygen Safety:  -Group instruction provided by PowerPoint, verbal discussion, and written material to support subject matter. There is an overview of "What is Oxygen" and "Why do we need it".  Instructor also reviews how to create a safe environment for oxygen use, the importance of using oxygen as prescribed, and the risks of noncompliance. There is a brief discussion on traveling with oxygen and resources the patient may utilize.   PULMONARY REHAB OTHER RESPIRATORY from  09/02/2016 in [place]  Date  09/02/16  Educator  Truddie Crumble  Instruction Review Code  2- meets goals/outcomes      Oxygen Equipment:  -Group instruction provided by [employer] Staff utilizing handouts, written materials, and equipment demonstrations.   Signs and Symptoms:  -Group instruction provided by written material and verbal discussion to support subject matter. Warning signs and symptoms of infection, stroke, and heart attack are reviewed and when to call the physician/911 reinforced. Tips for preventing the spread of infection discussed.   Advanced Directives:  -Group instruction provided by verbal instruction and written material to support subject matter. Instructor reviews Advanced Directive laws and proper instruction for filling out document.   Pulmonary Video:  -Group video education that reviews the importance of medication and oxygen compliance, exercise, good nutrition, pulmonary hygiene, and pursed lip and diaphragmatic breathing for the pulmonary patient.   Exercise for the Pulmonary Patient:  -Group instruction that is supported by a PowerPoint presentation. Instructor discusses benefits of exercise, core components of exercise, frequency, duration, and intensity of an exercise routine, importance of utilizing pulse oximetry during exercise, safety while exercising, and options of places to exercise outside of rehab.     Pulmonary Medications:  -Verbally interactive group education provided by instructor with focus on inhaled medications and proper administration.   PULMONARY REHAB OTHER RESPIRATORY from 09/02/2016 in [place]  Date  08/26/16  Educator  Dr. Sloan Leiter  Instruction Review Code  2- meets goals/outcomes      Anatomy and Physiology of the Respiratory System and Intimacy:  -Group instruction provided by PowerPoint, verbal discussion, and written material to support subject matter.  Instructor reviews respiratory cycle  and anatomical components of the respiratory system and their functions. Instructor also reviews differences in obstructive and restrictive respiratory diseases with examples of each. Intimacy, Sex, and Sexuality differences are reviewed with a discussion on how relationships can change when diagnosed with pulmonary disease. Common sexual concerns are reviewed.   PULMONARY REHAB OTHER RESPIRATORY from 09/02/2016 in [place]  Date  07/29/16  Educator  Sameer Teeple  Instruction Review Code  2- meets goals/outcomes      Knowledge Questionnaire Score:     Knowledge Questionnaire Score - 07/28/16 1155      Knowledge Questionnaire Score   Pre Score 12/13      Core Components/Risk Factors/Patient Goals at Admission:     Personal Goals and Risk Factors at Admission - 07/23/16 1052      Core Components/Risk Factors/Patient Goals on Admission    Weight Management Yes;Weight Loss   Intervention Weight Management: Develop a combined nutrition and exercise program designed to reach desired caloric intake, while maintaining appropriate intake of nutrient and fiber, sodium and fats, and appropriate energy expenditure required for the weight goal.;Weight Management: Provide education and appropriate resources to help participant work on and attain dietary goals.   Expected Outcomes Short Term: Continue to assess and modify interventions until short term weight is achieved;Understanding of distribution of calorie intake throughout the day with the consumption of 4-5 meals/snacks;Understanding recommendations for meals to include 15-35% energy as protein, 25-35% energy from fat, 35-60% energy from carbohydrates, less than 200mg  of dietary cholesterol, 20-35 gm of total fiber daily;Weight Loss: Understanding of general recommendations for a balanced deficit meal plan, which promotes 1-2 lb weight loss per week and includes a negative energy  balance of [phone] kcal/d   Increase Strength and Stamina Yes   Intervention Provide advice, education, support and counseling about physical activity/exercise needs.;Develop an individualized exercise prescription for aerobic and resistive training based on initial evaluation findings, risk stratification, comorbidities and participant's personal goals.   Expected Outcomes Achievement of increased cardiorespiratory fitness and enhanced flexibility, muscular endurance and strength shown through measurements of functional capacity and personal statement of participant.   Improve shortness of breath with ADL's Yes   Intervention Provide education, individualized exercise plan and daily activity instruction to help decrease symptoms of SOB with activities of daily living.   Expected Outcomes Short Term: Achieves a reduction of symptoms when performing activities of daily living.      Core Components/Risk Factors/Patient Goals Review:      Goals and Risk Factor Review    Row Name 08/09/16 1158 09/07/16 0719           Core Components/Risk Factors/Patient Goals Review   Personal Goals Review Weight Management/Obesity;Improve shortness of breath with ADL's Weight Management/Obesity;Improve shortness of breath with ADL's      Review see "comments" section on ITP see "comments" section on ITP      Expected Outcomes see admission expected outcomes see admission expected outcomes         Core Components/Risk Factors/Patient Goals at Discharge (Final Review):      Goals and Risk Factor Review - 09/07/16 0719      Core Components/Risk Factors/Patient Goals Review   Personal Goals Review Weight Management/Obesity;Improve shortness of breath with ADL's   Review see "comments" section on ITP   Expected Outcomes see admission expected outcomes      ITP Comments:   Comments: ITP REVIEW Pt is making expected progress toward pulmonary rehab goals after completing 9 sessions.  She is tolerating  increased workloads however she is extremely cautious not to do anything to aggravate rib pain. She is slowly loosing weight through healthy eating and exercise. Recommend continued exercise, life style modification, education, and utilization of breathing techniques to increase stamina and strength and decrease shortness of breath with exertion.

## 2016-09-09 ENCOUNTER — Encounter (HOSPITAL_COMMUNITY)
Admission: RE | Admit: 2016-09-09 | Discharge: 2016-09-09 | Disposition: A | Discharge status: Home / Self Care | Payer: Worker's Compensation | Source: Ambulatory Visit | Attending: Internal Medicine | Admitting: Internal Medicine

## 2016-09-09 VITALS — Wt 166.7 lb

## 2016-09-09 DIAGNOSIS — R0602 Shortness of breath: Secondary | ICD-10-CM | POA: Diagnosis not present

## 2016-09-09 DIAGNOSIS — I2699 Other pulmonary embolism without acute cor pulmonale: Secondary | ICD-10-CM

## 2016-09-09 NOTE — Progress Notes (Signed)
Daily Session Note  Patient Details  Name: Holly Bowen MRN: [id number] Date of Birth: 1956-05-05 Referring Provider:     Pulmonary Rehab Walk Test from 07/29/2016 in [place]  Referring Provider  Dr. Nelda Marseille      Encounter Date: 09/09/2016  Check In:     Session Check In - 09/09/16 1030      Check-In   Location [facility]   Staff Present Rosebud Poles, RN, BSN;Wynee Matarazzo, MS, ACSM RCEP, Exercise Physiologist;Lisa Ysidro Evert, RN;Portia Rollene Rotunda, RN, BSN   Supervising physician immediately available to respond to emergencies [facility] immediately available   Physician(s) Dr. Cathlean Sauer   Medication changes reported     No   Fall or balance concerns reported    No   Tobacco Cessation No Change   Warm-up and Cool-down Performed as group-led instruction   Resistance Training Performed Yes   VAD Patient? No     Pain Assessment   Currently in Pain? No/denies   Multiple Pain Sites No      Capillary Blood Glucose: No results found for this or any previous visit (from the past 24 hour(s)).      Exercise Prescription Changes - 09/09/16 1200      Response to Exercise   Blood Pressure (Admit) 132/74   Blood Pressure (Exercise) 140/76   Blood Pressure (Exit) 124/50   Heart Rate (Admit) 78 bpm   Heart Rate (Exercise) 91 bpm   Heart Rate (Exit) 74 bpm   Oxygen Saturation (Admit) 95 %   Oxygen Saturation (Exercise) 96 %   Oxygen Saturation (Exit) 95 %   Rating of Perceived Exertion (Exercise) 12   Perceived Dyspnea (Exercise) 2   Duration Progress to 45 minutes of aerobic exercise without signs/symptoms of physical distress   Intensity THRR unchanged     Progression   Progression Continue to progress workloads to maintain intensity without signs/symptoms of physical distress.     Resistance Training   Training Prescription Yes   Weight orange bands   Reps 10-15   Time 10 Minutes     Interval Training   Interval  Training No     NuStep   Level 2.5   Minutes 17   METs 2.5     Track   Laps 19   Minutes 17      History  Smoking Status  . Never Smoker  Smokeless Tobacco  . Never Used    Goals Met:  Exercise tolerated well No report of cardiac concerns or symptoms Strength training completed today  Goals Unmet:  Not Applicable  Comments: Service time is from 10:30a to 12:05p    Dr. Rush Farmer is Medical Director for Pulmonary Rehab at [facility].

## 2016-09-14 ENCOUNTER — Encounter (HOSPITAL_COMMUNITY)
Admission: RE | Admit: 2016-09-14 | Discharge: 2016-09-14 | Disposition: A | Discharge status: Home / Self Care | Payer: Worker's Compensation | Source: Ambulatory Visit | Attending: Internal Medicine | Admitting: Internal Medicine

## 2016-09-14 VITALS — Wt 166.4 lb

## 2016-09-14 DIAGNOSIS — I2699 Other pulmonary embolism without acute cor pulmonale: Secondary | ICD-10-CM

## 2016-09-14 DIAGNOSIS — R0602 Shortness of breath: Secondary | ICD-10-CM | POA: Diagnosis not present

## 2016-09-14 NOTE — Progress Notes (Signed)
Daily Session Note  Patient Details  Name: Holly Bowen MRN: [id number] Date of Birth: 1955-09-20 Referring Provider:     Pulmonary Rehab Walk Test from 07/29/2016 in [place]  Referring Provider  Dr. Nelda Marseille      Encounter Date: 09/14/2016  Check In:     Session Check In - 09/14/16 1222      Check-In   [facility]   Staff Present Rosebud Poles, RN, BSN;Molly diVincenzo, MS, ACSM RCEP, Exercise Physiologist;Lisa Ysidro Evert, RN;Adoni Greenough Rollene Rotunda, RN, BSN   Supervising physician immediately available to respond to emergencies [facility] immediately available   Physician(s) Dr. Cathlean Sauer   Medication changes reported     No   Fall or balance concerns reported    No   Tobacco Cessation No Change   Warm-up and Cool-down Performed as group-led instruction   Resistance Training Performed Yes   VAD Patient? No     Pain Assessment   Currently in Pain? No/denies   Multiple Pain Sites No      Capillary Blood Glucose: No results found for this or any previous visit (from the past 24 hour(s)).      Exercise Prescription Changes - 09/14/16 1223      Response to Exercise   Blood Pressure (Admit) 140/84   Blood Pressure (Exercise) 140/72   Blood Pressure (Exit) 142/74   Heart Rate (Admit) 82 bpm   Heart Rate (Exercise) 111 bpm   Heart Rate (Exit) 86 bpm   Oxygen Saturation (Admit) 96 %   Oxygen Saturation (Exercise) 91 %   Oxygen Saturation (Exit) 96 %   Rating of Perceived Exertion (Exercise) 12   Perceived Dyspnea (Exercise) 1   Duration Progress to 45 minutes of aerobic exercise without signs/symptoms of physical distress   Intensity THRR unchanged     Progression   Progression Continue to progress workloads to maintain intensity without signs/symptoms of physical distress.     Resistance Training   Training Prescription Yes   Weight orange bands   Reps 10-15   Time 10 Minutes     Interval Training   Interval  Training No     Bike   Level 4.5   Minutes 17     NuStep   Level 6   Minutes 17   METs 2.8     Track   Laps 17   Minutes 17      History  Smoking Status  . Never Smoker  Smokeless Tobacco  . Never Used    Goals Met:  Independence with exercise equipment Improved SOB with ADL's Using PLB without cueing & demonstrates good technique Exercise tolerated well No report of cardiac concerns or symptoms Strength training completed today  Goals Unmet:  Not Applicable  Comments: Service time is from 1030 to 1210   Dr. Rush Farmer is Medical Director for Pulmonary Rehab at [facility].

## 2016-09-16 ENCOUNTER — Encounter (HOSPITAL_COMMUNITY)
Admission: RE | Admit: 2016-09-16 | Discharge: 2016-09-16 | Disposition: A | Discharge status: Home / Self Care | Payer: Worker's Compensation | Source: Ambulatory Visit | Attending: Internal Medicine | Admitting: Internal Medicine

## 2016-09-16 VITALS — Wt 165.6 lb

## 2016-09-16 DIAGNOSIS — R0602 Shortness of breath: Secondary | ICD-10-CM | POA: Diagnosis not present

## 2016-09-16 DIAGNOSIS — I2699 Other pulmonary embolism without acute cor pulmonale: Secondary | ICD-10-CM

## 2016-09-16 NOTE — Progress Notes (Signed)
Daily Session Note  Patient Details  Name: Holly Bowen MRN: [id number] Date of Birth: Jan 20, 1956 Referring Provider:     Pulmonary Rehab Walk Test from 07/29/2016 in [place]  Referring Provider  Dr. Nelda Marseille      Encounter Date: 09/16/2016  Check In:     Session Check In - 09/16/16 1030      Check-In   Location [facility]   Staff Present Rosebud Poles, RN, BSN;Devon Kingdon, MS, ACSM RCEP, Exercise Physiologist;Portia Rollene Rotunda, RN, BSN   Supervising physician immediately available to respond to emergencies [facility] immediately available   Physician(s) Dr. Candiss Norse   Medication changes reported     No   Fall or balance concerns reported    No   Tobacco Cessation No Change   Warm-up and Cool-down Performed as group-led instruction   Resistance Training Performed Yes   VAD Patient? No     Pain Assessment   Currently in Pain? No/denies   Multiple Pain Sites No      Capillary Blood Glucose: No results found for this or any previous visit (from the past 24 hour(s)).      Exercise Prescription Changes - 09/16/16 1200      Response to Exercise   Blood Pressure (Admit) 104/70   Blood Pressure (Exercise) 130/80   Blood Pressure (Exit) 126/70   Heart Rate (Admit) 77 bpm   Heart Rate (Exercise) 118 bpm   Heart Rate (Exit) 82 bpm   Oxygen Saturation (Admit) 94 %   Oxygen Saturation (Exercise) 92 %   Oxygen Saturation (Exit) 94 %   Rating of Perceived Exertion (Exercise) 12   Perceived Dyspnea (Exercise) 1   Duration Progress to 45 minutes of aerobic exercise without signs/symptoms of physical distress   Intensity THRR unchanged     Progression   Progression Continue to progress workloads to maintain intensity without signs/symptoms of physical distress.     Resistance Training   Training Prescription Yes   Weight orange bands   Reps 10-15   Time 10 Minutes     Interval Training   Interval Training No     Bike    Level 4.5   Minutes 17     NuStep   Level 6   Minutes 17   METs 3.3      History  Smoking Status  . Never Smoker  Smokeless Tobacco  . Never Used    Goals Met:  Exercise tolerated well No report of cardiac concerns or symptoms Strength training completed today  Goals Unmet:  Not Applicable  Comments: Service time is from 10:30a to 12:30p    Dr. Rush Farmer is Medical Director for Pulmonary Rehab at [facility].

## 2016-09-21 ENCOUNTER — Encounter (HOSPITAL_COMMUNITY)
Admission: RE | Admit: 2016-09-21 | Discharge: 2016-09-21 | Disposition: A | Discharge status: Home / Self Care | Payer: Worker's Compensation | Source: Ambulatory Visit | Attending: Internal Medicine | Admitting: Internal Medicine

## 2016-09-21 VITALS — Wt 166.7 lb

## 2016-09-21 DIAGNOSIS — I2699 Other pulmonary embolism without acute cor pulmonale: Secondary | ICD-10-CM

## 2016-09-21 DIAGNOSIS — R0602 Shortness of breath: Secondary | ICD-10-CM | POA: Diagnosis not present

## 2016-09-21 NOTE — Progress Notes (Signed)
Daily Session Note  Patient Details  Name: Holly Bowen MRN: [id number] Date of Birth: May 30, 1956 Referring Provider:     Pulmonary Rehab Walk Test from 07/29/2016 in [place]  Referring Provider  Dr. Nelda Marseille      Encounter Date: 09/21/2016  Check In:     Session Check In - 09/21/16 1208      Check-In   [facility]   Staff Present Su Hilt, MS, ACSM RCEP, Exercise Physiologist;Portia Rollene Rotunda, RN, BSN;Lisa Ysidro Evert, RN;Joan Leonia Reeves, RN, BSN   Supervising physician immediately available to respond to emergencies [facility] immediately available   Physician(s) Dr. Candiss Norse   Medication changes reported     No   Fall or balance concerns reported    No   Tobacco Cessation No Change   Warm-up and Cool-down Performed as group-led instruction   Resistance Training Performed Yes   VAD Patient? No     Pain Assessment   Currently in Pain? No/denies      Capillary Blood Glucose: No results found for this or any previous visit (from the past 24 hour(s)).      Exercise Prescription Changes - 09/21/16 1200      Response to Exercise   Blood Pressure (Admit) 110/70   Blood Pressure (Exercise) 140/60   Blood Pressure (Exit) 118/62   Heart Rate (Admit) 68 bpm   Heart Rate (Exercise) 92 bpm   Heart Rate (Exit) 73 bpm   Oxygen Saturation (Admit) 96 %   Oxygen Saturation (Exercise) 94 %   Oxygen Saturation (Exit) 95 %   Rating of Perceived Exertion (Exercise) 12   Perceived Dyspnea (Exercise) 1   Duration Progress to 45 minutes of aerobic exercise without signs/symptoms of physical distress   Intensity THRR unchanged     Progression   Progression Continue to progress workloads to maintain intensity without signs/symptoms of physical distress.     Resistance Training   Training Prescription Yes   Weight orange bands   Reps 10-15   Time 10 Minutes     Interval Training   Interval Training No     Bike   Level  4.5   Minutes 17     NuStep   Level 6   Minutes 17   METs 2.2     Track   Laps 20   Minutes 17      History  Smoking Status  . Never Smoker  Smokeless Tobacco  . Never Used    Goals Met:  Exercise tolerated well No report of cardiac concerns or symptoms Strength training completed today  Goals Unmet:  Not Applicable  Comments: Service time is from 10:30a to 12:00p    Dr. Rush Farmer is Medical Director for Pulmonary Rehab at [facility].

## 2016-09-23 ENCOUNTER — Encounter (HOSPITAL_COMMUNITY)
Admission: RE | Admit: 2016-09-23 | Discharge: 2016-09-23 | Disposition: A | Discharge status: Home / Self Care | Payer: Worker's Compensation | Source: Ambulatory Visit | Attending: Internal Medicine | Admitting: Internal Medicine

## 2016-09-23 ENCOUNTER — Encounter (HOSPITAL_COMMUNITY): Payer: Worker's Compensation

## 2016-09-23 VITALS — Wt 167.3 lb

## 2016-09-23 DIAGNOSIS — R0602 Shortness of breath: Secondary | ICD-10-CM | POA: Diagnosis not present

## 2016-09-23 DIAGNOSIS — I2699 Other pulmonary embolism without acute cor pulmonale: Secondary | ICD-10-CM

## 2016-09-23 NOTE — Progress Notes (Signed)
Daily Session Note  Patient Details  Name: Holly Bowen MRN: [id number] Date of Birth: 02/22/1956 Referring Provider:     Pulmonary Rehab Walk Test from 07/29/2016 in [place]  Referring Provider  Dr. Nelda Marseille      Encounter Date: 09/23/2016  Check In:     Session Check In - 09/23/16 1058      Check-In   [facility]   Staff Present Trish Fountain, RN, Maxcine Ham, RN, BSN;Lisa Hughes, RN;Molly diVincenzo, MS, ACSM RCEP, Exercise Physiologist   Supervising physician immediately available to respond to emergencies [facility] immediately available   Physician(s) Dr. Posey Pronto   Medication changes reported     No   Fall or balance concerns reported    No   Tobacco Cessation No Change   Warm-up and Cool-down Performed as group-led instruction   Resistance Training Performed Yes   VAD Patient? No     Pain Assessment   Currently in Pain? No/denies   Multiple Pain Sites No      Capillary Blood Glucose: No results found for this or any previous visit (from the past 24 hour(s)).      Exercise Prescription Changes - 09/23/16 1200      Response to Exercise   Blood Pressure (Admit) 126/70   Blood Pressure (Exercise) 150/84   Blood Pressure (Exit) 130/74   Heart Rate (Admit) 65 bpm   Heart Rate (Exercise) 99 bpm   Heart Rate (Exit) 73 bpm   Oxygen Saturation (Admit) 96 %   Oxygen Saturation (Exercise) 94 %   Oxygen Saturation (Exit) 96 %   Rating of Perceived Exertion (Exercise) 13   Perceived Dyspnea (Exercise) 1   Duration Progress to 45 minutes of aerobic exercise without signs/symptoms of physical distress   Intensity THRR unchanged     Progression   Progression Continue to progress workloads to maintain intensity without signs/symptoms of physical distress.     Resistance Training   Training Prescription Yes   Weight orange bands   Reps 10-15   Time 10 Minutes     Interval Training   Interval  Training No     Bike   Level 4.5   Minutes 17     Track   Laps 22   Minutes 17      History  Smoking Status  . Never Smoker  Smokeless Tobacco  . Never Used    Goals Met:  Exercise tolerated well Strength training completed today  Goals Unmet:  Not Applicable  Comments: Service time is from 1030 to 1230.  Attended the Doctor Day lecture today.    Dr. Rush Farmer is Medical Director for Pulmonary Rehab at [facility].

## 2016-09-28 ENCOUNTER — Encounter (HOSPITAL_COMMUNITY)
Admission: RE | Admit: 2016-09-28 | Discharge: 2016-09-28 | Disposition: A | Discharge status: Home / Self Care | Payer: Worker's Compensation | Source: Ambulatory Visit | Attending: Internal Medicine | Admitting: Internal Medicine

## 2016-09-28 VITALS — Wt 166.7 lb

## 2016-09-28 DIAGNOSIS — R0602 Shortness of breath: Secondary | ICD-10-CM | POA: Diagnosis not present

## 2016-09-28 DIAGNOSIS — I2699 Other pulmonary embolism without acute cor pulmonale: Secondary | ICD-10-CM

## 2016-09-28 NOTE — Progress Notes (Signed)
Daily Session Note  Patient Details  Name: Holly Bowen MRN: [id number] Date of Birth: [birth year] Referring Provider:     Pulmonary Rehab Walk Test from 07/29/2016 in [place]  Referring Provider  Dr. Nelda Marseille      Encounter Date: 09/28/2016  Check In:     Session Check In - 09/28/16 1030      Check-In   [facility]   Staff Present Rosebud Poles, RN, BSN;Latrish Mogel, MS, ACSM RCEP, Exercise Physiologist;Lisa Ysidro Evert, RN;Portia Rollene Rotunda, RN, BSN   Supervising physician immediately available to respond to emergencies [facility] immediately available   Physician(s) Dr. Posey Pronto   Medication changes reported     No   Fall or balance concerns reported    No   Tobacco Cessation No Change   Warm-up and Cool-down Performed as group-led instruction   Resistance Training Performed Yes   VAD Patient? No     Pain Assessment   Currently in Pain? No/denies   Multiple Pain Sites No      Capillary Blood Glucose: No results found for this or any previous visit (from the past 24 hour(s)).      Exercise Prescription Changes - 09/28/16 1200      Response to Exercise   Blood Pressure (Admit) 126/70   Blood Pressure (Exercise) 120/60   Blood Pressure (Exit) 114/76   Heart Rate (Admit) 81 bpm   Heart Rate (Exercise) 99 bpm   Heart Rate (Exit) 71 bpm   Oxygen Saturation (Admit) 96 %   Oxygen Saturation (Exercise) 96 %   Oxygen Saturation (Exit) 96 %   Rating of Perceived Exertion (Exercise) 11   Perceived Dyspnea (Exercise) 1   Duration Progress to 45 minutes of aerobic exercise without signs/symptoms of physical distress   Intensity THRR unchanged     Progression   Progression Continue to progress workloads to maintain intensity without signs/symptoms of physical distress.     Resistance Training   Training Prescription Yes   Weight orange bands   Reps 10-15   Time 10 Minutes     Interval Training   Interval Training  No     Bike   Level 4.5   Minutes 17     NuStep   Level 6   Minutes 17   METs 3.1     Track   Laps 22   Minutes 17      History  Smoking Status  . Never Smoker  Smokeless Tobacco  . Never Used    Goals Met:  Exercise tolerated well No report of cardiac concerns or symptoms Strength training completed today  Goals Unmet:  Not Applicable  Comments: Service time is from 10:30a to 12:10p    Dr. Rush Farmer is Medical Director for Pulmonary Rehab at [facility].

## 2016-09-30 ENCOUNTER — Encounter (HOSPITAL_COMMUNITY): Payer: Worker's Compensation

## 2016-09-30 ENCOUNTER — Encounter (HOSPITAL_COMMUNITY)
Admission: RE | Admit: 2016-09-30 | Discharge: 2016-09-30 | Disposition: A | Discharge status: Home / Self Care | Payer: Worker's Compensation | Source: Ambulatory Visit | Attending: Internal Medicine | Admitting: Internal Medicine

## 2016-09-30 VITALS — Wt 167.5 lb

## 2016-09-30 DIAGNOSIS — I2699 Other pulmonary embolism without acute cor pulmonale: Secondary | ICD-10-CM

## 2016-09-30 DIAGNOSIS — R0602 Shortness of breath: Secondary | ICD-10-CM | POA: Diagnosis not present

## 2016-09-30 NOTE — Progress Notes (Signed)
Daily Session Note  Patient Details  Name: Magin Balbi MRN: [id number] Date of Birth: 06-04-1955 Referring Provider:     Pulmonary Rehab Walk Test from 07/29/2016 in [place]  Referring Provider  Dr. Nelda Marseille      Encounter Date: 09/30/2016  Check In:     Session Check In - 09/30/16 1030      Check-In   Location [facility]   Staff Present Trish Fountain, RN, BSN;Demarius Archila Ysidro Evert, RN;Joan Bris[place], RN, BSN;Amber Fair, MS, ACSM RCEP, Exercise Physiologist;Annedrea Rosezella [place], RN, [facility]   Supervising physician immediately available to respond to emergencies [facility] immediately available   Physician(s) Dr. Sloan Leiter   Medication changes reported     No   Fall or balance concerns reported    No   Tobacco Cessation No Change   Warm-up and Cool-down Performed as group-led instruction   Resistance Training Performed Yes   VAD Patient? No     Pain Assessment   Currently in Pain? No/denies      Capillary Blood Glucose: No results found for this or any previous visit (from the past 24 hour(s)).      Exercise Prescription Changes - 09/30/16 1200      Response to Exercise   Blood Pressure (Admit) 114/60   Blood Pressure (Exercise) 130/66   Blood Pressure (Exit) 120/74   Heart Rate (Admit) 72 bpm   Heart Rate (Exercise) 95 bpm   Heart Rate (Exit) 67 bpm   Oxygen Saturation (Admit) 95 %   Oxygen Saturation (Exercise) 97 %   Oxygen Saturation (Exit) 97 %   Rating of Perceived Exertion (Exercise) 11   Perceived Dyspnea (Exercise) 1   Duration Progress to 45 minutes of aerobic exercise without signs/symptoms of physical distress   Intensity THRR unchanged     Progression   Progression Continue to progress workloads to maintain intensity without signs/symptoms of physical distress.     Resistance Training   Training Prescription Yes   Weight orange bands   Reps 10-15   Time 10 Minutes     Interval Training   Interval Training  No     Bike   Level 4.5   Minutes 17     NuStep   Level 6   Minutes 17   METs 3      History  Smoking Status  . Never Smoker  Smokeless Tobacco  . Never Used    Goals Met:  Exercise tolerated well No report of cardiac concerns or symptoms Strength training completed today  Goals Unmet:  Not Applicable  Comments: Service time is from 13030 to 1220     Dr. Rush Farmer is Medical Director for Pulmonary Rehab at [facility].

## 2016-10-05 ENCOUNTER — Encounter (HOSPITAL_COMMUNITY)
Admission: RE | Admit: 2016-10-05 | Discharge: 2016-10-05 | Disposition: A | Discharge status: Home / Self Care | Payer: Worker's Compensation | Source: Ambulatory Visit | Attending: Internal Medicine | Admitting: Internal Medicine

## 2016-10-05 NOTE — Progress Notes (Signed)
Pulmonary Individual Treatment Plan  Patient Details  Name: Holly Bowen MRN: [id number] Date of Birth: 18-Jan-1956 Referring Provider:     Pulmonary Rehab Walk Test from 07/29/2016 in [facility]  Referring Provider  Dr. Nelda Marseille      Initial Encounter Date:    Pulmonary Rehab Walk Test from 07/29/2016 in [facility]  Date  07/29/16  Referring Provider  Dr. Nelda Marseille      Visit Diagnosis: Other acute pulmonary embolism without acute cor pulmonale ([place])  Patient's Home Medications on Admission:   Current Outpatient Prescriptions:  .  acetaminophen (TYLENOL) 325 MG tablet, Take 650 mg by mouth., Disp: , Rfl:  .  busPIRone (BUSPAR) 30 MG tablet, Take by mouth., Disp: , Rfl:  .  Cyanocobalamin 1000 MCG/ML LIQD, Take by mouth., Disp: , Rfl:  .  lisinopril-hydrochlorothiazide (PRINZIDE) 20-12.5 MG per tablet, Take by mouth., Disp: , Rfl:  .  metoprolol (LOPRESSOR) 50 MG tablet, Take 50 mg by mouth., Disp: , Rfl:  .  Misc. Devices MISC, Patient is still unable to return to work at this time., Disp: , Rfl:  .  oxycodone (OXY-IR) 5 MG capsule, Take 5 mg by mouth., Disp: , Rfl:  .  oxyCODONE (OXYCONTIN) 10 mg 12 hr tablet, Take by mouth., Disp: , Rfl:  .  risperiDONE (RISPERDAL) 0.25 MG tablet, Take 0.25 mg by mouth. Patient only takes 1 tab at hour of sleep, Disp: , Rfl:  .  rivaroxaban (XARELTO) 20 MG TABS tablet, Take by mouth., Disp: , Rfl:   Past Medical History: Past Medical History:  Diagnosis Date  . Hypertension     Tobacco Use: History  Smoking Status  . Never Smoker  Smokeless Tobacco  . Never Used    Labs: Recent Review Flowsheet Data    Labs for ITP Cardiac and Pulmonary Rehab Latest Ref Rng & Units 08/20/2008 10/13/2009   TCO2 0 - 100 mmol/L 26 27      Capillary Blood Glucose: No results found for: GLUCAP   ADL UCSD:   Pulmonary Function Assessment:     Pulmonary Function Assessment - 07/23/16 1051       Breath   Bilateral Breath Sounds Clear   Shortness of Breath Yes;Limiting activity      Exercise Target Goals:    Exercise Program Goal: Individual exercise prescription set with THRR, safety & activity barriers. Participant demonstrates ability to understand and report RPE using BORG scale, to self-measure pulse accurately, and to acknowledge the importance of the exercise prescription.  Exercise Prescription Goal: Starting with aerobic activity 30 plus minutes a day, 3 days per week for initial exercise prescription. Provide home exercise prescription and guidelines that participant acknowledges understanding prior to discharge.  Activity Barriers & Risk Stratification:     Activity Barriers & Cardiac Risk Stratification - 07/23/16 1050      Activity Barriers & Cardiac Risk Stratification   Activity Barriers Back Problems;Shortness of Breath;Deconditioning;Other (comment)   Comments chronic pain      6 Minute Walk:     6 Minute Walk    Row Name 07/29/16 1657         6 Minute Walk   Phase Initial     Distance 1490 feet     Walk Time 6 minutes     # of Rest Breaks 0     MPH 2.82     METS 3.14     RPE 11     Perceived  Dyspnea  1     Symptoms No     Resting HR 77 bpm     Resting BP 132/88     Max Ex. HR 100 bpm     Max Ex. BP 160/100     2 Minute Post BP 155/99       Interval HR   Baseline HR 77     1 Minute HR 89     2 Minute HR 95     3 Minute HR 97     4 Minute HR 99     5 Minute HR 98     6 Minute HR 100     2 Minute Post HR 81     Interval Heart Rate? Yes       Interval Oxygen   Interval Oxygen? Yes     Baseline Oxygen Saturation % 95 %     Baseline Liters of Oxygen 0 L     1 Minute Oxygen Saturation % 93 %     1 Minute Liters of Oxygen 0 L     2 Minute Oxygen Saturation % 92 %     2 Minute Liters of Oxygen 0 L     3 Minute Oxygen Saturation % 93 %     3 Minute Liters of Oxygen 0 L     4 Minute Oxygen Saturation % 93 %     4 Minute  Liters of Oxygen 0 L     5 Minute Oxygen Saturation % 94 %     5 Minute Liters of Oxygen 0 L     6 Minute Oxygen Saturation % 94 %     6 Minute Liters of Oxygen 0 L     2 Minute Post Oxygen Saturation % 96 %     2 Minute Post Liters of Oxygen 0 L        Oxygen Initial Assessment:     Oxygen Initial Assessment - 08/09/16 1157      Home Oxygen   Home Oxygen Device None     Initial 6 min Walk   Oxygen Used None     Program Oxygen Prescription   Program Oxygen Prescription None      Oxygen Re-Evaluation:     Oxygen Re-Evaluation    Row Name 09/07/16 0718 10/05/16 1417           Program Oxygen Prescription   Program Oxygen Prescription None None        Home Oxygen   Home Oxygen Device None None         Oxygen Discharge (Final Oxygen Re-Evaluation):     Oxygen Re-Evaluation - 10/05/16 1417      Program Oxygen Prescription   Program Oxygen Prescription None     Home Oxygen   Home Oxygen Device None      Initial Exercise Prescription:     Initial Exercise Prescription - 07/29/16 1700      Date of Initial Exercise RX and Referring Provider   Date 07/29/16   Referring Provider Dr. Nelda Marseille     Bike   Level 2   Minutes 17     NuStep   Level 2   Minutes 17   METs 1.5     Track   Laps 8   Minutes 17     Prescription Details   Frequency (times per week) 2   Duration Progress to 45 minutes of aerobic exercise without signs/symptoms of physical distress     Intensity  THRR 40-80% of Max Heartrate 63-126   Ratings of Perceived Exertion 11-13   Perceived Dyspnea 0-4     Progression   Progression Continue progressive overload as per policy without signs/symptoms or physical distress.     Resistance Training   Training Prescription Yes   Weight orange bands   Reps 10-15      Perform Capillary Blood Glucose checks as needed.  Exercise Prescription Changes:     Exercise Prescription Changes    Row Name 08/05/16 1200 08/10/16 1200  08/12/16 1300 08/17/16 1225 08/19/16 1200     Response to Exercise   Blood Pressure (Admit) 142/64 128/80 130/70 124/74 114/68   Blood Pressure (Exercise) 128/82 122/66 142/80 160/80 162/80   Blood Pressure (Exit) 122/70 122/70 124/70 130/76 122/72   Heart Rate (Admit) 96 bpm 67 bpm 75 bpm 69 bpm 72 bpm   Heart Rate (Exercise) 85 bpm 76 bpm 80 bpm 84 bpm 83 bpm   Heart Rate (Exit) 74 bpm 62 bpm 71 bpm 57 bpm 69 bpm   Oxygen Saturation (Admit) 96 % 95 % 98 % 95 % 97 %   Oxygen Saturation (Exercise) 97 % 95 % 96 % 95 % 94 %   Oxygen Saturation (Exit) 96 % 96 % 96 % 95 % 97 %   Rating of Perceived Exertion (Exercise) 11 11 9 14 11    Perceived Dyspnea (Exercise) 1 1 1 1 1    Duration Continue with 45 min of aerobic exercise without signs/symptoms of physical distress. Progress to 45 minutes of aerobic exercise without signs/symptoms of physical distress Progress to 45 minutes of aerobic exercise without signs/symptoms of physical distress Progress to 45 minutes of aerobic exercise without signs/symptoms of physical distress Progress to 45 minutes of aerobic exercise without signs/symptoms of physical distress   Intensity THRR unchanged THRR unchanged THRR unchanged THRR unchanged THRR unchanged     Progression   Progression Continue to progress workloads to maintain intensity without signs/symptoms of physical distress. Continue to progress workloads to maintain intensity without signs/symptoms of physical distress. Continue to progress workloads to maintain intensity without signs/symptoms of physical distress. Continue to progress workloads to maintain intensity without signs/symptoms of physical distress. Continue to progress workloads to maintain intensity without signs/symptoms of physical distress.     Resistance Training   Training Prescription Yes Yes Yes Yes Yes   Weight orange bands orange bands orange bands orange bands orange bands   Reps 10-15 10-15 10-15 10-15 10-15   Time  - 10  Minutes 10 Minutes 10 Minutes 10 Minutes     Bike   Level 2 2 2 2 3    Minutes 17 17 17 17 17      NuStep   Level  - 3 3 5   -   Minutes  - 17 17 17   -   METs  -  - 2.3 2.4  -     Track   Laps 16 14  - 16 16   Minutes 17 17  - 17 17   Row Name 08/24/16 1200 08/26/16 1200 09/07/16 1200 09/09/16 1200 09/14/16 1223     Response to Exercise   Blood Pressure (Admit) 110/70 120/64 116/64 132/74 140/84   Blood Pressure (Exercise) 156/84 128/60 140/74 140/76 140/72   Blood Pressure (Exit) 122/70 118/70 122/72 124/50 142/74   Heart Rate (Admit) 75 bpm 79 bpm 69 bpm 78 bpm 82 bpm   Heart Rate (Exercise) 90 bpm 96 bpm 94 bpm 91 bpm 111 bpm  Heart Rate (Exit) 77 bpm 75 bpm 71 bpm 74 bpm 86 bpm   Oxygen Saturation (Admit) 95 % 96 % 96 % 95 % 96 %   Oxygen Saturation (Exercise) 96 % 96 % 95 % 96 % 91 %   Oxygen Saturation (Exit) 97 % 96 % 96 % 95 % 96 %   Rating of Perceived Exertion (Exercise) 13 12 13 12 12    Perceived Dyspnea (Exercise) 1 1 2 2 1    Duration Progress to 45 minutes of aerobic exercise without signs/symptoms of physical distress Progress to 45 minutes of aerobic exercise without signs/symptoms of physical distress Progress to 45 minutes of aerobic exercise without signs/symptoms of physical distress Progress to 45 minutes of aerobic exercise without signs/symptoms of physical distress Progress to 45 minutes of aerobic exercise without signs/symptoms of physical distress   Intensity THRR unchanged THRR unchanged THRR unchanged THRR unchanged THRR unchanged     Progression   Progression Continue to progress workloads to maintain intensity without signs/symptoms of physical distress. Continue to progress workloads to maintain intensity without signs/symptoms of physical distress. Continue to progress workloads to maintain intensity without signs/symptoms of physical distress. Continue to progress workloads to maintain intensity without signs/symptoms of physical distress. Continue to  progress workloads to maintain intensity without signs/symptoms of physical distress.     Resistance Training   Training Prescription Yes Yes Yes Yes Yes   Weight orange bands orange bands orange bands orange bands orange bands   Reps 10-15 10-15 10-15 10-15 10-15   Time 10 Minutes 10 Minutes 10 Minutes 10 Minutes 10 Minutes     Interval Training   Interval Training  - No No No No     Bike   Level 4  - 4  - 4.5   Minutes 17  - 17  - 17     NuStep   Level 5 6 6  2.5 6   Minutes 17 17 17 17 17    METs 2.1 2.6 2 2.5 2.8     Track   Laps 15 16 18 19 17    Minutes 17 17 17 17 17      Home Exercise Plan   Plans to continue exercise at  -  - Home (comment)  -  -   Frequency  -  - Add 2 additional days to program exercise sessions.  -  -   Row Name 09/16/16 1200 09/21/16 1200 09/23/16 1200 09/28/16 1200 09/30/16 1200     Response to Exercise   Blood Pressure (Admit) 104/70 110/70 126/70 126/70 114/60   Blood Pressure (Exercise) 130/80 140/60 150/84 120/60 130/66   Blood Pressure (Exit) 126/70 118/62 130/74 114/76 120/74   Heart Rate (Admit) 77 bpm 68 bpm 65 bpm 81 bpm 72 bpm   Heart Rate (Exercise) 118 bpm 92 bpm 99 bpm 99 bpm 95 bpm   Heart Rate (Exit) 82 bpm 73 bpm 73 bpm 71 bpm 67 bpm   Oxygen Saturation (Admit) 94 % 96 % 96 % 96 % 95 %   Oxygen Saturation (Exercise) 92 % 94 % 94 % 96 % 97 %   Oxygen Saturation (Exit) 94 % 95 % 96 % 96 % 97 %   Rating of Perceived Exertion (Exercise) 12 12 13 11 11    Perceived Dyspnea (Exercise) 1 1 1 1 1    Duration Progress to 45 minutes of aerobic exercise without signs/symptoms of physical distress Progress to 45 minutes of aerobic exercise without signs/symptoms of physical distress Progress to  45 minutes of aerobic exercise without signs/symptoms of physical distress Progress to 45 minutes of aerobic exercise without signs/symptoms of physical distress Progress to 45 minutes of aerobic exercise without signs/symptoms of physical distress    Intensity THRR unchanged THRR unchanged THRR unchanged THRR unchanged THRR unchanged     Progression   Progression Continue to progress workloads to maintain intensity without signs/symptoms of physical distress. Continue to progress workloads to maintain intensity without signs/symptoms of physical distress. Continue to progress workloads to maintain intensity without signs/symptoms of physical distress. Continue to progress workloads to maintain intensity without signs/symptoms of physical distress. Continue to progress workloads to maintain intensity without signs/symptoms of physical distress.     Resistance Training   Training Prescription Yes Yes Yes Yes Yes   Weight orange bands orange bands orange bands orange bands orange bands   Reps 10-15 10-15 10-15 10-15 10-15   Time 10 Minutes 10 Minutes 10 Minutes 10 Minutes 10 Minutes     Interval Training   Interval Training No No No No No     Bike   Level 4.5 4.5 4.5 4.5 4.5   Minutes 17 17 17 17 17      NuStep   Level 6 6  - 6 6   Minutes 17 17  - 17 17   METs 3.3 2.2  - 3.1 3     Track   Laps  - 20 22 22   -   Minutes  - 17 17 17   -      Exercise Comments:     Exercise Comments    Row Name 09/07/16 1646           Exercise Comments Home exercise completed          Exercise Goals and Review:   Exercise Goals Re-Evaluation :     Exercise Goals Re-Evaluation    Row Name 08/10/16 0805 09/07/16 0712 09/07/16 0715 10/04/16 0748       Exercise Goal Re-Evaluation   Exercise Goals Review Increase Physical Activity;Increase Strenth and Stamina Increase Physical Activity;Increase Strenth and Stamina  - Increase Physical Activity;Increase Strenth and Stamina    Comments Patient has only attended one exercise sessions. Will cont. to monitor and progress.  Patient is progressing in physical capacity. She states that she is limited by rib pain. I educated the patient on the RPE (rate of percieved exertion) and asked her to  always maintain work loads at [employer] level but all the while managing her rib pain. She agreed. Will cont to monitor and progress.  - Patient is progressing in physical capacity. She states that she is limited by rib pain. I educated the patient on the RPE (rate of percieved exertion) and asked her to always maintain work loads at [employer] level but all the while managing her rib pain. She agreed. Will cont to monitor and progress. Will be transitioning to Pulmonary Maintenance soon if approved by her legal case manager.,    Expected Outcomes Through exercise at rehab and home patient will increase physical capacity, stength, and stamina.  - Through exercise at rehab and home patient will increase physical capacity, stength, and stamina. Through exercise at rehab and home patient will increase physical capacity, stength, and stamina.       Discharge Exercise Prescription (Final Exercise Prescription Changes):     Exercise Prescription Changes - 09/30/16 1200      Response to Exercise   Blood Pressure (Admit) 114/60   Blood Pressure (Exercise) 130/66  Blood Pressure (Exit) 120/74   Heart Rate (Admit) 72 bpm   Heart Rate (Exercise) 95 bpm   Heart Rate (Exit) 67 bpm   Oxygen Saturation (Admit) 95 %   Oxygen Saturation (Exercise) 97 %   Oxygen Saturation (Exit) 97 %   Rating of Perceived Exertion (Exercise) 11   Perceived Dyspnea (Exercise) 1   Duration Progress to 45 minutes of aerobic exercise without signs/symptoms of physical distress   Intensity THRR unchanged     Progression   Progression Continue to progress workloads to maintain intensity without signs/symptoms of physical distress.     Resistance Training   Training Prescription Yes   Weight orange bands   Reps 10-15   Time 10 Minutes     Interval Training   Interval Training No     Bike   Level 4.5   Minutes 17     NuStep   Level 6   Minutes 17   METs 3      Nutrition:  Target Goals: Understanding of  nutrition guidelines, daily intake of sodium 1500mg , cholesterol 200mg , calories 30% from fat and 7% or less from saturated fats, daily to have 5 or more servings of fruits and vegetables.  Biometrics:     Pre Biometrics - 08/19/16 1554      Pre Biometrics   Height 5\' 6"  (1.676 m)       Nutrition Therapy Plan and Nutrition Goals:     Nutrition Therapy & Goals - 08/17/16 1444      Nutrition Therapy   Diet Therapeutic Lifestyle Changes     Personal Nutrition Goals   Nutrition Goal Wt loss of 1-2 lb/week to a wt loss goal of 6-24 lb at graduation from [facility], educate and counsel regarding individualized specific dietary modifications aiming towards targeted core components such as weight, hypertension, lipid management, diabetes, heart failure and other comorbidities.   Expected Outcomes Short Term Goal: Understand basic principles of dietary content, such as calories, fat, sodium, cholesterol and nutrients.;Long Term Goal: Adherence to prescribed nutrition plan.      Nutrition Discharge: Rate Your Plate Scores:     Nutrition Assessments - 08/19/16 1226      Rate Your Plate Scores   Pre Score 53      Nutrition Goals Re-Evaluation:     Nutrition Goals Re-Evaluation    [place] Name 08/19/16 1236             Goals   Nutrition Goal Wt loss of 1-2 lb/week to a wt loss goal of 6-24 lb at graduation from [facility] Pt given 1500 kcal, 5 day menu ideas to help with wt loss goal as well as help pt schedule meal time.          Nutrition Goals Discharge (Final Nutrition Goals Re-Evaluation):     Nutrition Goals Re-Evaluation - 08/19/16 1236      Goals   Nutrition Goal Wt loss of 1-2 lb/week to a wt loss goal of 6-24 lb at graduation from [facility] Pt given 1500 kcal, 5 day menu ideas to help with wt loss goal as well as help pt schedule meal time.      Psychosocial: Target Goals:  Acknowledge presence or absence of significant depression and/or stress, maximize coping skills, provide positive support system. Participant is able to verbalize types and ability to use techniques and skills needed  for reducing stress and depression.  Initial Review & Psychosocial Screening:     Initial Psych Review & Screening - 07/23/16 1053      Initial Review   Current issues with Current Stress Concerns   Source of Stress Concerns Chronic Illness;Unable to participate in former interests or hobbies;Unable to perform yard/household activities     [place]? Yes     Barriers   Psychosocial barriers to participate in program There are no identifiable barriers or psychosocial needs.     Screening Interventions   Interventions Encouraged to exercise      Quality of Life Scores:   PHQ-9: Recent Review Flowsheet Data    Depression screen [facility] 2/9 07/23/2016 08/08/2014 03/08/2014   Decreased Interest 1 2 3    Down, Depressed, Hopeless 0 0 3   PHQ - 2 Score 1 2 6    Altered sleeping - 0 3    Tired, decreased energy - 3 3   Change in appetite - 0 3    Feeling bad or failure about yourself  - 0 0   Trouble concentrating - 0 0   Moving slowly or fidgety/restless - 0 1    Suicidal thoughts - 0 0   PHQ-9 Score - 5 16     Interpretation of Total Score  Total Score Depression Severity:  1-4 = Minimal depression, 5-9 = Mild depression, 10-14 = Moderate depression, 15-19 = Moderately severe depression, 20-27 = Severe depression   Psychosocial Evaluation and Intervention:     Psychosocial Evaluation - 08/09/16 1159      Psychosocial Evaluation & Interventions   Interventions Encouraged to exercise with the program and follow exercise prescription   Continue Psychosocial Services  Follow up required by staff      Psychosocial Re-Evaluation:     Psychosocial Re-Evaluation    [place] Name 08/09/16 1159 09/07/16 0719 10/05/16 1417         Psychosocial  Re-Evaluation   Current issues with Current Stress Concerns Current Stress Concerns Current Stress Concerns     Comments  -  - 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, Aeronautical engineer comp, 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     Interventions Encouraged to attend Pulmonary Rehabilitation for the exercise;Stress management education Encouraged to attend Pulmonary Rehabilitation for the exercise;Stress management education Encouraged to attend Pulmonary Rehabilitation for the exercise;Stress management education     Continue Psychosocial Services  Follow up required by staff Follow up required by staff Follow up required by staff       Initial Review   Source of Stress Concerns  - Chronic Illness;Unable to participate in former interests or hobbies;Unable to perform yard/household activities Chronic Illness;Unable to participate in former interests or hobbies;Unable to perform yard/household activities        Psychosocial Discharge (Final Psychosocial Re-Evaluation):     Psychosocial Re-Evaluation - 10/05/16 1417      Psychosocial Re-Evaluation   Current issues with Current Stress Concerns   Comments 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 Participant will state understanding/return demonstration of topics presented.  Learning Barriers/Preferences:     Learning Barriers/Preferences - 07/23/16 1050      Learning Barriers/Preferences   Learning Barriers None   Learning  Preferences Audio;Computer/Internet;Group Instruction;Individual Instruction;Pictoral;Skilled Demonstration;Verbal Instruction;Video;Written Material      Education Topics: Risk Factor Reduction:  -Group instruction that is supported by a PowerPoint presentation. Instructor discusses the definition of a risk factor, different risk factors for pulmonary disease, and how the heart and lungs work together.     PULMONARY REHAB OTHER RESPIRATORY from 09/30/2016 in [place]  Date  08/19/16  Educator  EP  Instruction Review Code  2- meets goals/outcomes      Nutrition for Pulmonary Patient:  -Group instruction provided by PowerPoint slides, verbal discussion, and written materials to support subject matter. The instructor gives an explanation and review of healthy diet recommendations, which includes a discussion on weight management, recommendations for fruit and vegetable consumption, as well as protein, fluid, caffeine, fiber, sodium, sugar, and alcohol. Tips for eating when patients are short of breath are discussed.   PULMONARY REHAB OTHER RESPIRATORY from 09/30/2016 in [facility]  Date  08/12/16  Educator  RD  Instruction Review Code  2- meets goals/outcomes      Pursed Lip Breathing:  -Group instruction that is supported by demonstration and informational handouts. Instructor discusses the benefits of pursed lip and diaphragmatic breathing and detailed demonstration on how to preform both.     Oxygen Safety:  -Group instruction provided by PowerPoint, verbal discussion, and written material to support subject matter. There is an overview of "What is Oxygen" and "Why do we need it".  Instructor also reviews how to create a safe environment for oxygen use, the importance of using oxygen as prescribed, and the risks of noncompliance. There is a brief discussion on traveling with oxygen and resources the patient may utilize.    PULMONARY REHAB OTHER RESPIRATORY from 09/30/2016 in [place]  Date  09/02/16  Educator  Truddie Crumble  Instruction Review Code  2- meets goals/outcomes      Oxygen Equipment:  -Group instruction provided by [employer] Staff utilizing handouts, written materials, and equipment demonstrations.   Signs and Symptoms:  -Group instruction provided by written material and verbal discussion to support subject matter. Warning signs and symptoms of infection, stroke, and heart attack are reviewed and when to call the physician/911 reinforced. Tips for preventing the spread of infection discussed.   PULMONARY REHAB OTHER RESPIRATORY from 09/30/2016 in [place]  Date  09/30/16  Educator  RN  Instruction Review Code  2- meets goals/outcomes      Advanced Directives:  -Group instruction provided by verbal instruction and written material to support subject matter. Instructor reviews Advanced Directive laws and proper instruction for filling out document.   Pulmonary Video:  -Group video education that reviews the importance of medication and oxygen compliance, exercise, good nutrition, pulmonary hygiene, and pursed lip and diaphragmatic breathing for the pulmonary patient.   PULMONARY REHAB OTHER RESPIRATORY from 09/30/2016 in [place]  Date  09/09/16  Instruction Review Code  2- meets goals/outcomes      Exercise for the Pulmonary Patient:  -Group instruction that is supported by a PowerPoint presentation. Instructor discusses benefits of exercise, core components of exercise, frequency, duration, and intensity of an exercise routine, importance of utilizing pulse oximetry  during exercise, safety while exercising, and options of places to exercise outside of rehab.     PULMONARY REHAB OTHER RESPIRATORY from 09/30/2016 in [place]  Date  09/16/16  Educator  ep  Instruction  Review Code  R- Review/reinforce      Pulmonary Medications:  -Verbally interactive group education provided by instructor with focus on inhaled medications and proper administration.   PULMONARY REHAB OTHER RESPIRATORY from 09/30/2016 in [place]  Date  08/26/16  Educator  Dr. Sloan Leiter  Instruction Review Code  2- meets goals/outcomes      Anatomy and Physiology of the Respiratory System and Intimacy:  -Group instruction provided by PowerPoint, verbal discussion, and written material to support subject matter. Instructor reviews respiratory cycle and anatomical components of the respiratory system and their functions. Instructor also reviews differences in obstructive and restrictive respiratory diseases with examples of each. Intimacy, Sex, and Sexuality differences are reviewed with a discussion on how relationships can change when diagnosed with pulmonary disease. Common sexual concerns are reviewed.   PULMONARY REHAB OTHER RESPIRATORY from 09/30/2016 in [place]  Date  07/29/16  Educator  Jaemarie Hochberg  Instruction Review Code  2- meets goals/outcomes      Knowledge Questionnaire Score:   Core Components/Risk Factors/Patient Goals at Admission:     Personal Goals and Risk Factors at Admission - 07/23/16 1052      Core Components/Risk Factors/Patient Goals on Admission    Weight Management Yes;Weight Loss   Intervention Weight Management: Develop a combined nutrition and exercise program designed to reach desired caloric intake, while maintaining appropriate intake of nutrient and fiber, sodium and fats, and appropriate energy expenditure required for the weight goal.;Weight Management: Provide education and appropriate resources to help participant work on and attain dietary goals.   Expected Outcomes Short Term: Continue to assess and modify interventions until short term weight is achieved;Understanding of distribution of  calorie intake throughout the day with the consumption of 4-5 meals/snacks;Understanding recommendations for meals to include 15-35% energy as protein, 25-35% energy from fat, 35-60% energy from carbohydrates, less than 200mg  of dietary cholesterol, 20-35 gm of total fiber daily;Weight Loss: Understanding of general recommendations for a balanced deficit meal plan, which promotes 1-2 lb weight loss per week and includes a negative energy balance of [phone] kcal/d   Increase Strength and Stamina Yes   Intervention Provide advice, education, support and counseling about physical activity/exercise needs.;Develop an individualized exercise prescription for aerobic and resistive training based on initial evaluation findings, risk stratification, comorbidities and participant's personal goals.   Expected Outcomes Achievement of increased cardiorespiratory fitness and enhanced flexibility, muscular endurance and strength shown through measurements of functional capacity and personal statement of participant.   Improve shortness of breath with ADL's Yes   Intervention Provide education, individualized exercise plan and daily activity instruction to help decrease symptoms of SOB with activities of daily living.   Expected Outcomes Short Term: Achieves a reduction of symptoms when performing activities of daily living.      Core Components/Risk Factors/Patient Goals Review:      Goals and Risk Factor Review    Row Name 08/09/16 1158 09/07/16 0719 10/05/16 1417         Core Components/Risk Factors/Patient Goals Review   Personal Goals Review Weight Management/Obesity;Improve shortness of breath with ADL's Weight Management/Obesity;Improve shortness of breath with ADL's Weight Management/Obesity;Improve shortness of breath with ADL's     Review see "comments"  section on ITP see "comments" section on ITP see "comments" section on ITP     Expected Outcomes see admission expected outcomes see admission  expected outcomes see admission expected outcomes        Core Components/Risk Factors/Patient Goals at Discharge (Final Review):      Goals and Risk Factor Review - 10/05/16 1417      Core Components/Risk Factors/Patient Goals Review   Personal Goals Review Weight Management/Obesity;Improve shortness of breath with ADL's   Review see "comments" section on ITP   Expected Outcomes see admission expected outcomes      ITP Comments:   Comments: ITP REVIEW Pt is making expected progress toward pulmonary rehab goals after completing 16 sessions. Recommend continued exercise, life style modification, education, and utilization of breathing techniques to increase stamina and strength and decrease shortness of breath with exertion.

## 2016-10-07 ENCOUNTER — Encounter (HOSPITAL_COMMUNITY)
Admission: RE | Admit: 2016-10-07 | Discharge: 2016-10-07 | Disposition: A | Discharge status: Home / Self Care | Payer: Worker's Compensation | Source: Ambulatory Visit | Attending: Internal Medicine | Admitting: Internal Medicine

## 2016-10-07 ENCOUNTER — Encounter (HOSPITAL_COMMUNITY): Payer: Worker's Compensation

## 2016-10-07 VITALS — Wt 167.3 lb

## 2016-10-07 DIAGNOSIS — I2699 Other pulmonary embolism without acute cor pulmonale: Secondary | ICD-10-CM

## 2016-10-07 DIAGNOSIS — R0602 Shortness of breath: Secondary | ICD-10-CM | POA: Diagnosis not present

## 2016-10-07 NOTE — Progress Notes (Signed)
Daily Session Note  Patient Details  Name: Holly Bowen MRN: [id number] Date of Birth: 06/15/1955 Referring Provider:     Pulmonary Rehab Walk Test from 07/29/2016 in [place]  Referring Provider  Dr. Nelda Marseille      Encounter Date: 10/07/2016  Check In:     Session Check In - 10/07/16 1030      Check-In   Location [facility]   Staff Present Rosebud Poles, RN, BSN;Sugey Trevathan Ysidro Evert, RN;Portia Rollene Rotunda, RN, BSN   Supervising physician immediately available to respond to emergencies [facility] immediately available   Physician(s) Dr. Wendee Beavers   Medication changes reported     No   Fall or balance concerns reported    No   Tobacco Cessation No Change   Warm-up and Cool-down Performed as group-led instruction   Resistance Training Performed Yes   VAD Patient? No     Pain Assessment   Currently in Pain? No/denies   Multiple Pain Sites No      Capillary Blood Glucose: No results found for this or any previous visit (from the past 24 hour(s)).      Exercise Prescription Changes - 10/07/16 1200      Response to Exercise   Blood Pressure (Admit) 128/66   Blood Pressure (Exercise) 140/80   Blood Pressure (Exit) 120/70   Heart Rate (Admit) 74 bpm   Heart Rate (Exercise) 88 bpm   Heart Rate (Exit) 67 bpm   Oxygen Saturation (Admit) 96 %   Oxygen Saturation (Exercise) 95 %   Oxygen Saturation (Exit) 95 %   Rating of Perceived Exertion (Exercise) 11   Perceived Dyspnea (Exercise) 1   Duration Progress to 45 minutes of aerobic exercise without signs/symptoms of physical distress   Intensity THRR unchanged     Progression   Progression Continue to progress workloads to maintain intensity without signs/symptoms of physical distress.     Resistance Training   Training Prescription Yes   Weight porange bands   Reps 10-15   Time 10 Minutes     Interval Training   Interval Training No     Bike   Level 4.5   Minutes 17     Track    Laps 18   Minutes 17      History  Smoking Status  . Never Smoker  Smokeless Tobacco  . Never Used    Goals Met:  Exercise tolerated well No report of cardiac concerns or symptoms Strength training completed today  Goals Unmet:  Not Applicable  Comments: Service time is from 1030 to 1230    Dr. Rush Farmer is Medical Director for Pulmonary Rehab at [facility].

## 2016-10-08 DIAGNOSIS — Z0000 Encounter for general adult medical examination without abnormal findings: Secondary | ICD-10-CM | POA: Diagnosis not present

## 2016-10-08 DIAGNOSIS — Z9081 Acquired absence of spleen: Secondary | ICD-10-CM | POA: Diagnosis not present

## 2016-10-08 DIAGNOSIS — F419 Anxiety disorder, unspecified: Secondary | ICD-10-CM | POA: Diagnosis not present

## 2016-10-08 DIAGNOSIS — R7989 Other specified abnormal findings of blood chemistry: Secondary | ICD-10-CM | POA: Diagnosis not present

## 2016-10-08 DIAGNOSIS — G8929 Other chronic pain: Secondary | ICD-10-CM | POA: Diagnosis not present

## 2016-10-08 DIAGNOSIS — F329 Major depressive disorder, single episode, unspecified: Secondary | ICD-10-CM | POA: Diagnosis not present

## 2016-10-08 DIAGNOSIS — I10 Essential (primary) hypertension: Secondary | ICD-10-CM | POA: Diagnosis not present

## 2016-10-08 DIAGNOSIS — M545 Low back pain: Secondary | ICD-10-CM | POA: Diagnosis not present

## 2016-10-12 ENCOUNTER — Encounter (HOSPITAL_COMMUNITY)
Admission: RE | Admit: 2016-10-12 | Discharge: 2016-10-12 | Disposition: A | Discharge status: Home / Self Care | Payer: Worker's Compensation | Source: Ambulatory Visit | Attending: Internal Medicine | Admitting: Internal Medicine

## 2016-10-12 VITALS — Wt 164.2 lb

## 2016-10-12 DIAGNOSIS — I2699 Other pulmonary embolism without acute cor pulmonale: Secondary | ICD-10-CM

## 2016-10-12 DIAGNOSIS — R0602 Shortness of breath: Secondary | ICD-10-CM | POA: Diagnosis not present

## 2016-10-12 NOTE — Progress Notes (Signed)
Daily Session Note  Patient Details  Name: Demani Weyrauch MRN: [id number] Date of Birth: 05/08/1956 Referring Provider:     Pulmonary Rehab Walk Test from 07/29/2016 in [place]  Referring Provider  Dr. Nelda Marseille      Encounter Date: 10/12/2016  Check In:     Session Check In - 10/12/16 1030      Check-In   Location [facility]   Staff Present Rosebud Poles, RN, BSN;Vivi Piccirilli, MS, ACSM RCEP, Exercise Physiologist;Lisa Ysidro Evert, RN;Portia Rollene Rotunda, RN, BSN   Supervising physician immediately available to respond to emergencies [facility] immediately available   Physician(s) Dr. Wendee Beavers   Medication changes reported     No   Fall or balance concerns reported    No   Tobacco Cessation No Change   Warm-up and Cool-down Performed as group-led instruction   Resistance Training Performed No   VAD Patient? No     Pain Assessment   Currently in Pain? No/denies   Multiple Pain Sites No      Capillary Blood Glucose: No results found for this or any previous visit (from the past 24 hour(s)).      Exercise Prescription Changes - 10/12/16 1200      Response to Exercise   Blood Pressure (Admit) 118/72   Blood Pressure (Exercise) 110/70   Blood Pressure (Exit) 126/72   Heart Rate (Admit) 69 bpm   Heart Rate (Exercise) 86 bpm   Heart Rate (Exit) 67 bpm   Oxygen Saturation (Admit) 95 %   Oxygen Saturation (Exercise) 94 %   Oxygen Saturation (Exit) 97 %   Rating of Perceived Exertion (Exercise) 13   Perceived Dyspnea (Exercise) 1   Duration Progress to 45 minutes of aerobic exercise without signs/symptoms of physical distress   Intensity THRR unchanged     Progression   Progression Continue to progress workloads to maintain intensity without signs/symptoms of physical distress.     Resistance Training   Training Prescription Yes   Weight porange bands   Reps 10-15   Time 10 Minutes     Interval Training   Interval Training  No     Bike   Level 4.5   Minutes 17     NuStep   Level 7   Minutes 17   METs 3.1     Track   Laps 18   Minutes 17      History  Smoking Status  . Never Smoker  Smokeless Tobacco  . Never Used    Goals Met:  Exercise tolerated well No report of cardiac concerns or symptoms Strength training completed today  Goals Unmet:  Not Applicable  Comments: Service time is from 10:30a to 12:15p    Dr. Rush Farmer is Medical Director for Pulmonary Rehab at [facility].

## 2016-10-14 ENCOUNTER — Encounter (HOSPITAL_COMMUNITY)
Admission: RE | Admit: 2016-10-14 | Discharge: 2016-10-14 | Disposition: A | Discharge status: Home / Self Care | Payer: Worker's Compensation | Source: Ambulatory Visit | Attending: Internal Medicine | Admitting: Internal Medicine

## 2016-10-14 ENCOUNTER — Encounter (HOSPITAL_COMMUNITY): Payer: Worker's Compensation

## 2016-10-14 VITALS — Wt 164.0 lb

## 2016-10-14 DIAGNOSIS — R0602 Shortness of breath: Secondary | ICD-10-CM | POA: Diagnosis not present

## 2016-10-14 DIAGNOSIS — I2699 Other pulmonary embolism without acute cor pulmonale: Secondary | ICD-10-CM

## 2016-10-14 NOTE — Progress Notes (Signed)
Daily Session Note  Patient Details  Name: Holly Bowen MRN: [id number] Date of Birth: 05/19/1956 Referring Provider:     Pulmonary Rehab Walk Test from 07/29/2016 in [facility]  Referring Provider  Dr. Yacoub      Encounter Date: 10/14/2016  Check In:     Session Check In - 10/14/16 1049      Check-In   [facility]   Staff Present Molly diVincenzo, MS, ACSM RCEP, Exercise Physiologist;Joan Behrens, RN, BSN;Lisa Hughes, RN   Supervising physician immediately available to respond to emergencies [facility] immediately available   Physician(s) Dr. Joseph   Medication changes reported     No   Fall or balance concerns reported    No   Tobacco Cessation No Change   Warm-up and Cool-down Performed as group-led instruction   Resistance Training Performed Yes   VAD Patient? No     Pain Assessment   Currently in Pain? No/denies      Capillary Blood Glucose: No results found for this or any previous visit (from the past 24 hour(s)).      Exercise Prescription Changes - 10/14/16 1200      Response to Exercise   Blood Pressure (Admit) 132/86   Blood Pressure (Exercise) 124/72   Blood Pressure (Exit) 120/74   Heart Rate (Admit) 76 bpm   Heart Rate (Exercise) 99 bpm   Heart Rate (Exit) 73 bpm   Oxygen Saturation (Admit) 96 %   Oxygen Saturation (Exercise) 96 %   Oxygen Saturation (Exit) 96 %   Rating of Perceived Exertion (Exercise) 12   Perceived Dyspnea (Exercise) 1   Duration Progress to 45 minutes of aerobic exercise without signs/symptoms of physical distress   Intensity THRR unchanged     Progression   Progression Continue to progress workloads to maintain intensity without signs/symptoms of physical distress.     Resistance Training   Training Prescription Yes   Weight porange bands   Reps 10-15   Time 10 Minutes     Interval Training   Interval Training No     Bike   Level --   Minutes --      NuStep   Level 7   Minutes 17   METs 2.2     Track   Laps 20   Minutes 17      History  Smoking Status  . Never Smoker  Smokeless Tobacco  . Never Used    Goals Met:  Exercise tolerated well No report of cardiac concerns or symptoms Strength training completed today  Goals Unmet:  Not Applicable  Comments: Service time is from 1030 to 1220    Dr. Wesam G. Yacoub is Medical Director for Pulmonary Rehab at [facility]. 

## 2016-10-19 ENCOUNTER — Encounter (HOSPITAL_COMMUNITY)
Admission: RE | Admit: 2016-10-19 | Discharge: 2016-10-19 | Disposition: A | Discharge status: Home / Self Care | Payer: Worker's Compensation | Source: Ambulatory Visit | Attending: Internal Medicine | Admitting: Internal Medicine

## 2016-10-19 VITALS — Wt 164.2 lb

## 2016-10-19 DIAGNOSIS — I2699 Other pulmonary embolism without acute cor pulmonale: Secondary | ICD-10-CM

## 2016-10-19 DIAGNOSIS — R0602 Shortness of breath: Secondary | ICD-10-CM | POA: Diagnosis not present

## 2016-10-19 NOTE — Progress Notes (Signed)
Daily Session Note  Patient Details  Name: Holly Bowen MRN: [id number] Date of Birth: [birth year] Referring Provider:     Pulmonary Rehab Walk Test from 07/29/2016 in [place]  Referring Provider  Dr. Nelda Marseille      Encounter Date: 10/19/2016  Check In:     Session Check In - 10/19/16 1030      Check-In   [facility]   Staff Present Rosebud Poles, RN, BSN;Delanee Xin, MS, ACSM RCEP, Exercise Physiologist;Lisa Ysidro Evert, RN;Portia Rollene Rotunda, RN, BSN   Supervising physician immediately available to respond to emergencies [facility] immediately available   Physician(s) Dr. Broadus John   Medication changes reported     No   Fall or balance concerns reported    No   Tobacco Cessation No Change   Warm-up and Cool-down Performed as group-led instruction   Resistance Training Performed Yes   VAD Patient? No     Pain Assessment   Currently in Pain? No/denies   Multiple Pain Sites No      Capillary Blood Glucose: No results found for this or any previous visit (from the past 24 hour(s)).      Exercise Prescription Changes - 10/19/16 1200      Response to Exercise   Blood Pressure (Admit) 120/64   Blood Pressure (Exercise) 140/70   Blood Pressure (Exit) 122/68   Heart Rate (Admit) 72 bpm   Heart Rate (Exercise) 95 bpm   Heart Rate (Exit) 75 bpm   Oxygen Saturation (Admit) 95 %   Oxygen Saturation (Exercise) 92 %   Oxygen Saturation (Exit) 97 %   Rating of Perceived Exertion (Exercise) 11   Perceived Dyspnea (Exercise) 1   Duration Progress to 45 minutes of aerobic exercise without signs/symptoms of physical distress   Intensity THRR unchanged     Progression   Progression Continue to progress workloads to maintain intensity without signs/symptoms of physical distress.     Resistance Training   Training Prescription Yes   Weight orange bands   Reps 10-15   Time 10 Minutes     Interval Training   Interval  Training No     Bike   Level 4.7   Minutes 17     NuStep   Level 7   Minutes 17   METs 2.8     Track   Laps 18   Minutes 17      History  Smoking Status  . Never Smoker  Smokeless Tobacco  . Never Used    Goals Met:  Exercise tolerated well No report of cardiac concerns or symptoms Strength training completed today  Goals Unmet:  Not Applicable  Comments: Service time is from 10:30a to 12:04p    Dr. Rush Farmer is Medical Director for Pulmonary Rehab at [facility].

## 2016-10-21 ENCOUNTER — Encounter (HOSPITAL_COMMUNITY): Payer: Worker's Compensation

## 2016-10-21 ENCOUNTER — Encounter (HOSPITAL_COMMUNITY)
Admission: RE | Admit: 2016-10-21 | Discharge: 2016-10-21 | Disposition: A | Discharge status: Home / Self Care | Payer: Worker's Compensation | Source: Ambulatory Visit | Attending: Internal Medicine | Admitting: Internal Medicine

## 2016-10-21 VITALS — Wt 166.0 lb

## 2016-10-21 DIAGNOSIS — I2699 Other pulmonary embolism without acute cor pulmonale: Secondary | ICD-10-CM

## 2016-10-21 DIAGNOSIS — R0602 Shortness of breath: Secondary | ICD-10-CM | POA: Diagnosis not present

## 2016-10-21 NOTE — Progress Notes (Signed)
Daily Session Note  Patient Details  Name: Holly Bowen MRN: [id number] Date of Birth: 31-Aug-1955 Referring Provider:     Pulmonary Rehab Walk Test from 07/29/2016 in [place]  Referring Provider  Dr. Nelda Marseille      Encounter Date: 10/21/2016  Check In:     Session Check In - 10/21/16 1030      Check-In   [facility]   Staff Present Trish Fountain, RN, BSN;Ramon Dredge, RN, MHA;Joan Leonia Reeves, RN, BSN   Supervising physician immediately available to respond to emergencies [facility] immediately available   Physician(s) Dr. Karleen Hampshire   Medication changes reported     No   Fall or balance concerns reported    No   Tobacco Cessation No Change   Warm-up and Cool-down Performed as group-led instruction   Resistance Training Performed Yes   VAD Patient? No     Pain Assessment   Currently in Pain? No/denies   Multiple Pain Sites No      Capillary Blood Glucose: No results found for this or any previous visit (from the past 24 hour(s)).      Exercise Prescription Changes - 10/21/16 1246      Response to Exercise   Blood Pressure (Admit) 120/60   Blood Pressure (Exercise) 140/76   Blood Pressure (Exit) 112/80   Heart Rate (Admit) 68 bpm   Heart Rate (Exercise) 98 bpm   Heart Rate (Exit) 71 bpm   Oxygen Saturation (Admit) 98 %   Oxygen Saturation (Exercise) 92 %   Oxygen Saturation (Exit) 97 %   Rating of Perceived Exertion (Exercise) 11   Perceived Dyspnea (Exercise) 1   Duration Progress to 45 minutes of aerobic exercise without signs/symptoms of physical distress   Intensity THRR unchanged     Progression   Progression Continue to progress workloads to maintain intensity without signs/symptoms of physical distress.     Resistance Training   Training Prescription Yes   Weight orange bands   Reps 10-15   Time 10 Minutes     Interval Training   Interval Training No     Bike   Level 4.5   Minutes 17      NuStep   Level 7   Minutes 17   METs 2.7      History  Smoking Status  . Never Smoker  Smokeless Tobacco  . Never Used    Goals Met:  Independence with exercise equipment Improved SOB with ADL's Using PLB without cueing & demonstrates good technique Exercise tolerated well No report of cardiac concerns or symptoms Strength training completed today  Goals Unmet:  Not Applicable  Comments: Service time is from 1030 to 1215   Dr. Rush Farmer is Medical Director for Pulmonary Rehab at [facility].

## 2016-10-26 ENCOUNTER — Encounter (HOSPITAL_COMMUNITY): Payer: Worker's Compensation

## 2016-10-28 ENCOUNTER — Encounter (HOSPITAL_COMMUNITY): Payer: Worker's Compensation

## 2016-11-02 ENCOUNTER — Encounter (HOSPITAL_COMMUNITY)
Admission: RE | Admit: 2016-11-02 | Discharge: 2016-11-02 | Disposition: A | Discharge status: Home / Self Care | Payer: Worker's Compensation | Source: Ambulatory Visit | Attending: Internal Medicine | Admitting: Internal Medicine

## 2016-11-02 VITALS — Wt 166.9 lb

## 2016-11-02 DIAGNOSIS — I2699 Other pulmonary embolism without acute cor pulmonale: Secondary | ICD-10-CM

## 2016-11-02 DIAGNOSIS — R0602 Shortness of breath: Secondary | ICD-10-CM | POA: Diagnosis not present

## 2016-11-02 NOTE — Progress Notes (Signed)
Daily Session Note  Patient Details  Name: Holly Bowen MRN: [id number] Date of Birth: [birth year] Referring Provider:     Pulmonary Rehab Walk Test from 07/29/2016 in [place]  Referring Provider  Dr. Nelda Marseille      Encounter Date: 11/02/2016  Check In:     Session Check In - 11/02/16 1044      Check-In   [facility]   Staff Present Trish Fountain, RN, Maxcine Ham, RN, BSN;Lisa Hughes, RN;Osa Fogarty, MS, ACSM RCEP, Exercise Physiologist   Supervising physician immediately available to respond to emergencies [facility] immediately available   Physician(s) Dr. Broadus John   Medication changes reported     No   Fall or balance concerns reported    No   Tobacco Cessation No Change   Warm-up and Cool-down Performed as group-led instruction   Resistance Training Performed Yes   VAD Patient? No     Pain Assessment   Currently in Pain? No/denies      Capillary Blood Glucose: No results found for this or any previous visit (from the past 24 hour(s)).      Exercise Prescription Changes - 11/02/16 1200      Response to Exercise   Blood Pressure (Admit) 124/64   Blood Pressure (Exercise) 136/70   Blood Pressure (Exit) 124/70   Heart Rate (Admit) 66 bpm   Heart Rate (Exercise) 83 bpm   Heart Rate (Exit) 63 bpm   Oxygen Saturation (Admit) 97 %   Oxygen Saturation (Exercise) 94 %   Oxygen Saturation (Exit) 96 %   Rating of Perceived Exertion (Exercise) 11   Perceived Dyspnea (Exercise) 1   Duration Progress to 45 minutes of aerobic exercise without signs/symptoms of physical distress   Intensity THRR unchanged     Progression   Progression Continue to progress workloads to maintain intensity without signs/symptoms of physical distress.     Resistance Training   Training Prescription Yes   Weight orange bands   Reps 10-15   Time 10 Minutes     Interval Training   Interval Training No     Bike   Level  4.5   Minutes 17     NuStep   Level 7   Minutes 17   METs 2     Track   Laps 16   Minutes 17      History  Smoking Status  . Never Smoker  Smokeless Tobacco  . Never Used    Goals Met:  Exercise tolerated well No report of cardiac concerns or symptoms Strength training completed today  Goals Unmet:  Not Applicable  Comments: Service time is from 10:30a to 12:10p    Dr. Rush Farmer is Medical Director for Pulmonary Rehab at [facility]

## 2016-11-02 NOTE — Progress Notes (Signed)
Pulmonary Individual Treatment Plan  Patient Details  Name: Holly Bowen MRN: [id number] Date of Birth: [birth year] Referring Provider:     Pulmonary Rehab Walk Test from 07/29/2016 in [place]  Referring Provider  Dr. Nelda Marseille      Initial Encounter Date:    Pulmonary Rehab Walk Test from 07/29/2016 in [place]  Date  07/29/16  Referring Provider  Dr. Nelda Marseille      Visit Diagnosis: Other acute pulmonary embolism without acute cor pulmonale ([place])  Patient's Home Medications on Admission:   Current Outpatient Prescriptions:  .  acetaminophen (TYLENOL) 325 MG tablet, Take 650 mg by mouth., Disp: , Rfl:  .  busPIRone (BUSPAR) 30 MG tablet, Take by mouth., Disp: , Rfl:  .  Cyanocobalamin 1000 MCG/ML LIQD, Take by mouth., Disp: , Rfl:  .  lisinopril-hydrochlorothiazide (PRINZIDE) 20-12.5 MG per tablet, Take by mouth., Disp: , Rfl:  .  metoprolol (LOPRESSOR) 50 MG tablet, Take 50 mg by mouth., Disp: , Rfl:  .  Misc. Devices MISC, Patient is still unable to return to work at this time., Disp: , Rfl:  .  oxycodone (OXY-IR) 5 MG capsule, Take 5 mg by mouth., Disp: , Rfl:  .  oxyCODONE (OXYCONTIN) 10 mg 12 hr tablet, Take by mouth., Disp: , Rfl:  .  risperiDONE (RISPERDAL) 0.25 MG tablet, Take 0.25 mg by mouth. Patient only takes 1 tab at hour of sleep, Disp: , Rfl:  .  rivaroxaban (XARELTO) 20 MG TABS tablet, Take by mouth., Disp: , Rfl:   Past Medical History: Past Medical History:  Diagnosis Date  . Hypertension     Tobacco Use: History  Smoking Status  . Never Smoker  Smokeless Tobacco  . Never Used    Labs: Recent Review Flowsheet Data    Labs for ITP Cardiac and Pulmonary Rehab Latest Ref Rng & Units 08/20/2008 10/13/2009   TCO2 0 - 100 mmol/L 26 27      Capillary Blood Glucose: No results found for: GLUCAP   ADL UCSD:   Pulmonary Function Assessment:     Pulmonary Function Assessment - 07/23/16 1051       Breath   Bilateral Breath Sounds Clear   Shortness of Breath Yes;Limiting activity      Exercise Target Goals:    Exercise Program Goal: Individual exercise prescription set with THRR, safety & activity barriers. Participant demonstrates ability to understand and report RPE using BORG scale, to self-measure pulse accurately, and to acknowledge the importance of the exercise prescription.  Exercise Prescription Goal: Starting with aerobic activity 30 plus minutes a day, 3 days per week for initial exercise prescription. Provide home exercise prescription and guidelines that participant acknowledges understanding prior to discharge.  Activity Barriers & Risk Stratification:     Activity Barriers & Cardiac Risk Stratification - 07/23/16 1050      Activity Barriers & Cardiac Risk Stratification   Activity Barriers Back Problems;Shortness of Breath;Deconditioning;Other (comment)   Comments chronic pain      6 Minute Walk:     6 Minute Walk    Row Name 07/29/16 1657         6 Minute Walk   Phase Initial     Distance 1490 feet     Walk Time 6 minutes     # of Rest Breaks 0     MPH 2.82     METS 3.14     RPE 11     Perceived  Dyspnea  1     Symptoms No     Resting HR 77 bpm     Resting BP 132/88     Max Ex. HR 100 bpm     Max Ex. BP 160/100     2 Minute Post BP 155/99       Interval HR   Baseline HR 77     1 Minute HR 89     2 Minute HR 95     3 Minute HR 97     4 Minute HR 99     5 Minute HR 98     6 Minute HR 100     2 Minute Post HR 81     Interval Heart Rate? Yes       Interval Oxygen   Interval Oxygen? Yes     Baseline Oxygen Saturation % 95 %     Baseline Liters of Oxygen 0 L     1 Minute Oxygen Saturation % 93 %     1 Minute Liters of Oxygen 0 L     2 Minute Oxygen Saturation % 92 %     2 Minute Liters of Oxygen 0 L     3 Minute Oxygen Saturation % 93 %     3 Minute Liters of Oxygen 0 L     4 Minute Oxygen Saturation % 93 %     4 Minute  Liters of Oxygen 0 L     5 Minute Oxygen Saturation % 94 %     5 Minute Liters of Oxygen 0 L     6 Minute Oxygen Saturation % 94 %     6 Minute Liters of Oxygen 0 L     2 Minute Post Oxygen Saturation % 96 %     2 Minute Post Liters of Oxygen 0 L        Oxygen Initial Assessment:     Oxygen Initial Assessment - 08/09/16 1157      Home Oxygen   Home Oxygen Device None     Initial 6 min Walk   Oxygen Used None     Program Oxygen Prescription   Program Oxygen Prescription None      Oxygen Re-Evaluation:     Oxygen Re-Evaluation    Row Name 09/07/16 0718 10/05/16 1417 11/02/16 0851         Program Oxygen Prescription   Program Oxygen Prescription None None None       Home Oxygen   Home Oxygen Device None None None        Oxygen Discharge (Final Oxygen Re-Evaluation):     Oxygen Re-Evaluation - 11/02/16 0851      Program Oxygen Prescription   Program Oxygen Prescription None     Home Oxygen   Home Oxygen Device None      Initial Exercise Prescription:     Initial Exercise Prescription - 07/29/16 1700      Date of Initial Exercise RX and Referring Provider   Date 07/29/16   Referring Provider Dr. Nelda Marseille     Bike   Level 2   Minutes 17     NuStep   Level 2   Minutes 17   METs 1.5     Track   Laps 8   Minutes 17     Prescription Details   Frequency (times per week) 2   Duration Progress to 45 minutes of aerobic exercise without signs/symptoms of physical distress     Intensity  THRR 40-80% of Max Heartrate 63-126   Ratings of Perceived Exertion 11-13   Perceived Dyspnea 0-4     Progression   Progression Continue progressive overload as per policy without signs/symptoms or physical distress.     Resistance Training   Training Prescription Yes   Weight orange bands   Reps 10-15      Perform Capillary Blood Glucose checks as needed.  Exercise Prescription Changes:     Exercise Prescription Changes    Row Name 08/05/16 1200  08/10/16 1200 08/12/16 1300 08/17/16 1225 08/19/16 1200     Response to Exercise   Blood Pressure (Admit) 142/64 128/80 130/70 124/74 114/68   Blood Pressure (Exercise) 128/82 122/66 142/80 160/80 162/80   Blood Pressure (Exit) 122/70 122/70 124/70 130/76 122/72   Heart Rate (Admit) 96 bpm 67 bpm 75 bpm 69 bpm 72 bpm   Heart Rate (Exercise) 85 bpm 76 bpm 80 bpm 84 bpm 83 bpm   Heart Rate (Exit) 74 bpm 62 bpm 71 bpm 57 bpm 69 bpm   Oxygen Saturation (Admit) 96 % 95 % 98 % 95 % 97 %   Oxygen Saturation (Exercise) 97 % 95 % 96 % 95 % 94 %   Oxygen Saturation (Exit) 96 % 96 % 96 % 95 % 97 %   Rating of Perceived Exertion (Exercise) 11 11 9 14 11    Perceived Dyspnea (Exercise) 1 1 1 1 1    Duration Continue with 45 min of aerobic exercise without signs/symptoms of physical distress. Progress to 45 minutes of aerobic exercise without signs/symptoms of physical distress Progress to 45 minutes of aerobic exercise without signs/symptoms of physical distress Progress to 45 minutes of aerobic exercise without signs/symptoms of physical distress Progress to 45 minutes of aerobic exercise without signs/symptoms of physical distress   Intensity THRR unchanged THRR unchanged THRR unchanged THRR unchanged THRR unchanged     Progression   Progression Continue to progress workloads to maintain intensity without signs/symptoms of physical distress. Continue to progress workloads to maintain intensity without signs/symptoms of physical distress. Continue to progress workloads to maintain intensity without signs/symptoms of physical distress. Continue to progress workloads to maintain intensity without signs/symptoms of physical distress. Continue to progress workloads to maintain intensity without signs/symptoms of physical distress.     Resistance Training   Training Prescription Yes Yes Yes Yes Yes   Weight orange bands orange bands orange bands orange bands orange bands   Reps 10-15 10-15 10-15 10-15 10-15    Time  - 10 Minutes 10 Minutes 10 Minutes 10 Minutes     Bike   Level 2 2 2 2 3    Minutes 17 17 17 17 17      NuStep   Level  - 3 3 5   -   Minutes  - 17 17 17   -   METs  -  - 2.3 2.4  -     Track   Laps 16 14  - 16 16   Minutes 17 17  - 17 17   Row Name 08/24/16 1200 08/26/16 1200 09/07/16 1200 09/09/16 1200 09/14/16 1223     Response to Exercise   Blood Pressure (Admit) 110/70 120/64 116/64 132/74 140/84   Blood Pressure (Exercise) 156/84 128/60 140/74 140/76 140/72   Blood Pressure (Exit) 122/70 118/70 122/72 124/50 142/74   Heart Rate (Admit) 75 bpm 79 bpm 69 bpm 78 bpm 82 bpm   Heart Rate (Exercise) 90 bpm 96 bpm 94 bpm 91 bpm 111 bpm  Heart Rate (Exit) 77 bpm 75 bpm 71 bpm 74 bpm 86 bpm   Oxygen Saturation (Admit) 95 % 96 % 96 % 95 % 96 %   Oxygen Saturation (Exercise) 96 % 96 % 95 % 96 % 91 %   Oxygen Saturation (Exit) 97 % 96 % 96 % 95 % 96 %   Rating of Perceived Exertion (Exercise) 13 12 13 12 12    Perceived Dyspnea (Exercise) 1 1 2 2 1    Duration Progress to 45 minutes of aerobic exercise without signs/symptoms of physical distress Progress to 45 minutes of aerobic exercise without signs/symptoms of physical distress Progress to 45 minutes of aerobic exercise without signs/symptoms of physical distress Progress to 45 minutes of aerobic exercise without signs/symptoms of physical distress Progress to 45 minutes of aerobic exercise without signs/symptoms of physical distress   Intensity THRR unchanged THRR unchanged THRR unchanged THRR unchanged THRR unchanged     Progression   Progression Continue to progress workloads to maintain intensity without signs/symptoms of physical distress. Continue to progress workloads to maintain intensity without signs/symptoms of physical distress. Continue to progress workloads to maintain intensity without signs/symptoms of physical distress. Continue to progress workloads to maintain intensity without signs/symptoms of physical distress.  Continue to progress workloads to maintain intensity without signs/symptoms of physical distress.     Resistance Training   Training Prescription Yes Yes Yes Yes Yes   Weight orange bands orange bands orange bands orange bands orange bands   Reps 10-15 10-15 10-15 10-15 10-15   Time 10 Minutes 10 Minutes 10 Minutes 10 Minutes 10 Minutes     Interval Training   Interval Training  - No No No No     Bike   Level 4  - 4  - 4.5   Minutes 17  - 17  - 17     NuStep   Level 5 6 6  2.5 6   Minutes 17 17 17 17 17    METs 2.1 2.6 2 2.5 2.8     Track   Laps 15 16 18 19 17    Minutes 17 17 17 17 17      Home Exercise Plan   Plans to continue exercise at  -  - Home (comment)  -  -   Frequency  -  - Add 2 additional days to program exercise sessions.  -  -   Row Name 09/16/16 1200 09/21/16 1200 09/23/16 1200 09/28/16 1200 09/30/16 1200     Response to Exercise   Blood Pressure (Admit) 104/70 110/70 126/70 126/70 114/60   Blood Pressure (Exercise) 130/80 140/60 150/84 120/60 130/66   Blood Pressure (Exit) 126/70 118/62 130/74 114/76 120/74   Heart Rate (Admit) 77 bpm 68 bpm 65 bpm 81 bpm 72 bpm   Heart Rate (Exercise) 118 bpm 92 bpm 99 bpm 99 bpm 95 bpm   Heart Rate (Exit) 82 bpm 73 bpm 73 bpm 71 bpm 67 bpm   Oxygen Saturation (Admit) 94 % 96 % 96 % 96 % 95 %   Oxygen Saturation (Exercise) 92 % 94 % 94 % 96 % 97 %   Oxygen Saturation (Exit) 94 % 95 % 96 % 96 % 97 %   Rating of Perceived Exertion (Exercise) 12 12 13 11 11    Perceived Dyspnea (Exercise) 1 1 1 1 1    Duration Progress to 45 minutes of aerobic exercise without signs/symptoms of physical distress Progress to 45 minutes of aerobic exercise without signs/symptoms of physical distress Progress to  45 minutes of aerobic exercise without signs/symptoms of physical distress Progress to 45 minutes of aerobic exercise without signs/symptoms of physical distress Progress to 45 minutes of aerobic exercise without signs/symptoms of physical  distress   Intensity THRR unchanged THRR unchanged THRR unchanged THRR unchanged THRR unchanged     Progression   Progression Continue to progress workloads to maintain intensity without signs/symptoms of physical distress. Continue to progress workloads to maintain intensity without signs/symptoms of physical distress. Continue to progress workloads to maintain intensity without signs/symptoms of physical distress. Continue to progress workloads to maintain intensity without signs/symptoms of physical distress. Continue to progress workloads to maintain intensity without signs/symptoms of physical distress.     Resistance Training   Training Prescription Yes Yes Yes Yes Yes   Weight orange bands orange bands orange bands orange bands orange bands   Reps 10-15 10-15 10-15 10-15 10-15   Time 10 Minutes 10 Minutes 10 Minutes 10 Minutes 10 Minutes     Interval Training   Interval Training No No No No No     Bike   Level 4.5 4.5 4.5 4.5 4.5   Minutes 17 17 17 17 17      NuStep   Level 6 6  - 6 6   Minutes 17 17  - 17 17   METs 3.3 2.2  - 3.1 3     Track   Laps  - 20 22 22   -   Minutes  - 17 17 17   -   Row Name 10/07/16 1200 10/12/16 1200 10/14/16 1200 10/19/16 1200 10/21/16 1246     Response to Exercise   Blood Pressure (Admit) 128/66 118/72 132/86 120/64 120/60   Blood Pressure (Exercise) 140/80 110/70 124/72 140/70 140/76   Blood Pressure (Exit) 120/70 126/72 120/74 122/68 112/80   Heart Rate (Admit) 74 bpm 69 bpm 76 bpm 72 bpm 68 bpm   Heart Rate (Exercise) 88 bpm 86 bpm 99 bpm 95 bpm 98 bpm   Heart Rate (Exit) 67 bpm 67 bpm 73 bpm 75 bpm 71 bpm   Oxygen Saturation (Admit) 96 % 95 % 96 % 95 % 98 %   Oxygen Saturation (Exercise) 95 % 94 % 96 % 92 % 92 %   Oxygen Saturation (Exit) 95 % 97 % 96 % 97 % 97 %   Rating of Perceived Exertion (Exercise) 11 13 12 11 11    Perceived Dyspnea (Exercise) 1 1 1 1 1    Duration Progress to 45 minutes of aerobic exercise without signs/symptoms of  physical distress Progress to 45 minutes of aerobic exercise without signs/symptoms of physical distress Progress to 45 minutes of aerobic exercise without signs/symptoms of physical distress Progress to 45 minutes of aerobic exercise without signs/symptoms of physical distress Progress to 45 minutes of aerobic exercise without signs/symptoms of physical distress   Intensity THRR unchanged THRR unchanged THRR unchanged THRR unchanged THRR unchanged     Progression   Progression Continue to progress workloads to maintain intensity without signs/symptoms of physical distress. Continue to progress workloads to maintain intensity without signs/symptoms of physical distress. Continue to progress workloads to maintain intensity without signs/symptoms of physical distress. Continue to progress workloads to maintain intensity without signs/symptoms of physical distress. Continue to progress workloads to maintain intensity without signs/symptoms of physical distress.     Resistance Training   Training Prescription Yes Yes Yes Yes Yes   Weight porange bands porange bands orange bands orange bands orange bands   Reps 10-15 10-15 10-15  10-15 10-15   Time 10 Minutes 10 Minutes 10 Minutes 10 Minutes 10 Minutes     Interval Training   Interval Training No No No No No     Bike   Level 4.5 4.5 - 4.7 4.5   Minutes 17 17 - 17 17     NuStep   Level  - 7 7 7 7    Minutes  - 17 17 17 17    METs  - 3.1 2.2 2.8 2.7     Track   Laps 18 18 20 18   -   Minutes 17 17 17 17   -      Exercise Comments:     Exercise Comments    Row Name 09/07/16 1646           Exercise Comments Home exercise completed          Exercise Goals and Review:   Exercise Goals Re-Evaluation :     Exercise Goals Re-Evaluation    Row Name 08/10/16 0805 09/07/16 6301 09/07/16 0715 10/04/16 0748 11/01/16 1008     Exercise Goal Re-Evaluation   Exercise Goals Review Increase Physical Activity;Increase Strenth and Stamina  Increase Physical Activity;Increase Strenth and Stamina  - Increase Physical Activity;Increase Strenth and Stamina Increase Strenth and Stamina;Increase Physical Activity   Comments Patient has only attended one exercise sessions. Will cont. to monitor and progress.  Patient is progressing in physical capacity. She states that she is limited by rib pain. I educated the patient on the RPE (rate of percieved exertion) and asked her to always maintain work loads at [employer] level but all the while managing her rib pain. She agreed. Will cont to monitor and progress.  - Patient is progressing in physical capacity. She states that she is limited by rib pain. I educated the patient on the RPE (rate of percieved exertion) and asked her to always maintain work loads at [employer] level but all the while managing her rib pain. She agreed. Will cont to monitor and progress. Will be transitioning to Pulmonary Maintenance soon if approved by her legal case manager., Patient is progressing in physical capacity. She states that she is limited by rib pain. I educated the patient on the RPE (rate of percieved exertion) and asked her to always maintain work loads at [employer] level but all the while managing her rib pain. She agreed. Will cont to monitor and progress. Will be transitioning to Pulmonary Maintenance soon if approved by her legal case manager.,   Expected Outcomes Through exercise at rehab and home patient will increase physical capacity, stength, and stamina.  - Through exercise at rehab and home patient will increase physical capacity, stength, and stamina. Through exercise at rehab and home patient will increase physical capacity, stength, and stamina. Through the exercise here at rehab the patient with increase physical capacity, strength, and stamina.      Discharge Exercise Prescription (Final Exercise Prescription Changes):     Exercise Prescription Changes - 10/21/16 1246      Response to  Exercise   Blood Pressure (Admit) 120/60   Blood Pressure (Exercise) 140/76   Blood Pressure (Exit) 112/80   Heart Rate (Admit) 68 bpm   Heart Rate (Exercise) 98 bpm   Heart Rate (Exit) 71 bpm   Oxygen Saturation (Admit) 98 %   Oxygen Saturation (Exercise) 92 %   Oxygen Saturation (Exit) 97 %   Rating of Perceived Exertion (Exercise) 11   Perceived Dyspnea (Exercise) 1   Duration Progress to  45 minutes of aerobic exercise without signs/symptoms of physical distress   Intensity THRR unchanged     Progression   Progression Continue to progress workloads to maintain intensity without signs/symptoms of physical distress.     Resistance Training   Training Prescription Yes   Weight orange bands   Reps 10-15   Time 10 Minutes     Interval Training   Interval Training No     Bike   Level 4.5   Minutes 17     NuStep   Level 7   Minutes 17   METs 2.7      Nutrition:  Target Goals: Understanding of nutrition guidelines, daily intake of sodium 1500mg , cholesterol 200mg , calories 30% from fat and 7% or less from saturated fats, daily to have 5 or more servings of fruits and vegetables.  Biometrics:     Pre Biometrics - 08/19/16 1554      Pre Biometrics   Height 5\' 6"  (1.676 m)       Nutrition Therapy Plan and Nutrition Goals:     Nutrition Therapy & Goals - 08/17/16 1444      Nutrition Therapy   Diet Therapeutic Lifestyle Changes     Personal Nutrition Goals   Nutrition Goal Wt loss of 1-2 lb/week to a wt loss goal of 6-24 lb at graduation from [place], educate and counsel regarding individualized specific dietary modifications aiming towards targeted core components such as weight, hypertension, lipid management, diabetes, heart failure and other comorbidities.   Expected Outcomes Short Term Goal: Understand basic principles of dietary content, such as calories, fat, sodium, cholesterol and nutrients.;Long  Term Goal: Adherence to prescribed nutrition plan.      Nutrition Discharge: Rate Your Plate Scores:     Nutrition Assessments - 08/19/16 1226      Rate Your Plate Scores   Pre Score 53      Nutrition Goals Re-Evaluation:     Nutrition Goals Re-Evaluation    [place] Name 08/19/16 1236             Goals   Nutrition Goal Wt loss of 1-2 lb/week to a wt loss goal of 6-24 lb at graduation from [place] [facility] given 1500 kcal, 5 day menu ideas to help with wt loss goal as well as help pt schedule meal time.          Nutrition Goals Discharge (Final Nutrition Goals Re-Evaluation):     Nutrition Goals Re-Evaluation - 08/19/16 1236      Goals   Nutrition Goal Wt loss of 1-2 lb/week to a wt loss goal of 6-24 lb at graduation from [place] [facility] given 1500 kcal, 5 day menu ideas to help with wt loss goal as well as help pt schedule meal time.      Psychosocial: Target Goals: Acknowledge presence or absence of significant depression and/or stress, maximize coping skills, provide positive support system. Participant is able to verbalize types and ability to use techniques and skills needed for reducing stress and depression.  Initial Review & Psychosocial Screening:     Initial Psych Review & Screening - 07/23/16 1053      Initial Review   Current issues with Current Stress Concerns   Source of Stress Concerns Chronic Illness;Unable to participate in former interests or hobbies;Unable to perform yard/household activities     Family Dynamics   Good  Support System? Yes     Barriers   Psychosocial barriers to participate in program There are no identifiable barriers or psychosocial needs.     Screening Interventions   Interventions Encouraged to exercise      Quality of Life Scores:   PHQ-9: Recent Review Flowsheet Data    Depression screen [facility] 2/9 07/23/2016 08/08/2014 03/08/2014   Decreased Interest 1 2 3    Down, Depressed, Hopeless 0 0 3    PHQ - 2 Score 1 2 6    Altered sleeping - 0 3    Tired, decreased energy - 3 3   Change in appetite - 0 3    Feeling bad or failure about yourself  - 0 0   Trouble concentrating - 0 0   Moving slowly or fidgety/restless - 0 1    Suicidal thoughts - 0 0   PHQ-9 Score - 5 16     Interpretation of Total Score  Total Score Depression Severity:  1-4 = Minimal depression, 5-9 = Mild depression, 10-14 = Moderate depression, 15-19 = Moderately severe depression, 20-27 = Severe depression   Psychosocial Evaluation and Intervention:     Psychosocial Evaluation - 08/09/16 1159      Psychosocial Evaluation & Interventions   Interventions Encouraged to exercise with the program and follow exercise prescription   Continue Psychosocial Services  Follow up required by staff      Psychosocial Re-Evaluation:     Psychosocial Re-Evaluation    [place] Name 08/09/16 1159 09/07/16 0719 10/05/16 1417 11/02/16 0857       Psychosocial Re-Evaluation   Current issues with Current Stress Concerns Current Stress Concerns Current Stress Concerns Current Stress Concerns    Comments  -  - 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, Aeronautical engineer comp, 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, Aeronautical engineer comp, 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    Interventions Encouraged to attend Pulmonary Rehabilitation for the exercise;Stress management education Encouraged to attend Pulmonary Rehabilitation for the exercise;Stress management education Encouraged to attend Pulmonary Rehabilitation for the exercise;Stress management education Encouraged to attend Pulmonary Rehabilitation for the exercise;Stress management education    Continue Psychosocial Services  Follow up required by staff Follow up required by staff Follow up required by staff Follow up required by staff      Initial Review   Source of Stress Concerns  - Chronic Illness;Unable to participate in  former interests or hobbies;Unable to perform yard/household activities Chronic Illness;Unable to participate in former interests or hobbies;Unable to perform yard/household activities Chronic Illness;Unable to participate in former interests or hobbies;Unable to perform yard/household activities       Psychosocial Discharge (Final Psychosocial Re-Evaluation):     Psychosocial Re-Evaluation - 11/02/16 0857      Psychosocial Re-Evaluation   Current issues with Current Stress Concerns   Comments 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, workmans comp, 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   Interventions Encouraged to attend Pulmonary Rehabilitation for the exercise;Stress management education   Continue Psychosocial Services  Follow up required by staff     Initial Review   Source of Stress Concerns Chronic Illness;Unable to participate in former interests or hobbies;Unable to perform yard/household activities      Education: Education Goals: Education classes will be provided on a weekly basis, covering required topics. Participant will state understanding/return demonstration of topics presented.  Learning Barriers/Preferences:     Learning Barriers/Preferences - 07/23/16  1050      Learning Barriers/Preferences   Learning Barriers None   Learning Preferences Audio;Computer/Internet;Group Instruction;Individual Instruction;Pictoral;Skilled Demonstration;Verbal Instruction;Video;Written Material      Education Topics: Risk Factor Reduction:  -Group instruction that is supported by a PowerPoint presentation. Instructor discusses the definition of a risk factor, different risk factors for pulmonary disease, and how the heart and lungs work together.     PULMONARY REHAB OTHER RESPIRATORY from 10/21/2016 in [place]  Date  08/19/16  Educator  EP  Instruction Review Code  2- meets goals/outcomes      Nutrition for Pulmonary Patient:   -Group instruction provided by PowerPoint slides, verbal discussion, and written materials to support subject matter. The instructor gives an explanation and review of healthy diet recommendations, which includes a discussion on weight management, recommendations for fruit and vegetable consumption, as well as protein, fluid, caffeine, fiber, sodium, sugar, and alcohol. Tips for eating when patients are short of breath are discussed.   PULMONARY REHAB OTHER RESPIRATORY from 10/21/2016 in [place]  Date  08/12/16  Educator  RD  Instruction Review Code  2- meets goals/outcomes      Pursed Lip Breathing:  -Group instruction that is supported by demonstration and informational handouts. Instructor discusses the benefits of pursed lip and diaphragmatic breathing and detailed demonstration on how to preform both.     Oxygen Safety:  -Group instruction provided by PowerPoint, verbal discussion, and written material to support subject matter. There is an overview of "What is Oxygen" and "Why do we need it".  Instructor also reviews how to create a safe environment for oxygen use, the importance of using oxygen as prescribed, and the risks of noncompliance. There is a brief discussion on traveling with oxygen and resources the patient may utilize.   PULMONARY REHAB OTHER RESPIRATORY from 10/21/2016 in [place]  Date  09/02/16  Educator  Truddie Crumble  Instruction Review Code  2- meets goals/outcomes      Oxygen Equipment:  -Group instruction provided by [employer] Staff utilizing handouts, written materials, and equipment demonstrations.   PULMONARY REHAB OTHER RESPIRATORY from 10/21/2016 in [place]  Date  10/21/16  Educator  [facility]  Instruction Review Code  2- meets goals/outcomes      Signs and Symptoms:  -Group instruction provided by written material and verbal discussion to support  subject matter. Warning signs and symptoms of infection, stroke, and heart attack are reviewed and when to call the physician/911 reinforced. Tips for preventing the spread of infection discussed.   PULMONARY REHAB OTHER RESPIRATORY from 10/21/2016 in [place]  Date  09/30/16  Educator  RN  Instruction Review Code  2- meets goals/outcomes      Advanced Directives:  -Group instruction provided by verbal instruction and written material to support subject matter. Instructor reviews Advanced Directive laws and proper instruction for filling out document.   Pulmonary Video:  -Group video education that reviews the importance of medication and oxygen compliance, exercise, good nutrition, pulmonary hygiene, and pursed lip and diaphragmatic breathing for the pulmonary patient.   PULMONARY REHAB OTHER RESPIRATORY from 10/21/2016 in [place]  Date  09/09/16  Instruction Review Code  2- meets goals/outcomes      Exercise for the Pulmonary Patient:  -Group instruction that is supported by a PowerPoint presentation. Instructor discusses benefits of exercise, core components of exercise, frequency,  duration, and intensity of an exercise routine, importance of utilizing pulse oximetry during exercise, safety while exercising, and options of places to exercise outside of rehab.     PULMONARY REHAB OTHER RESPIRATORY from 10/21/2016 in [place]  Date  09/16/16  Educator  ep  Instruction Review Code  R- Review/reinforce      Pulmonary Medications:  -Verbally interactive group education provided by instructor with focus on inhaled medications and proper administration.   PULMONARY REHAB OTHER RESPIRATORY from 10/21/2016 in [place]  Date  10/14/16  Educator  Pharm  Instruction Review Code  2- meets goals/outcomes      Anatomy and Physiology of the Respiratory System  and Intimacy:  -Group instruction provided by PowerPoint, verbal discussion, and written material to support subject matter. Instructor reviews respiratory cycle and anatomical components of the respiratory system and their functions. Instructor also reviews differences in obstructive and restrictive respiratory diseases with examples of each. Intimacy, Sex, and Sexuality differences are reviewed with a discussion on how relationships can change when diagnosed with pulmonary disease. Common sexual concerns are reviewed.   PULMONARY REHAB OTHER RESPIRATORY from 10/21/2016 in [place]  Date  10/07/16  Educator  Earline Stiner  Instruction Review Code  2- meets goals/outcomes      MD DAY -A group question and answer session with a medical doctor that allows participants to ask questions that relate to their pulmonary disease state.   OTHER EDUCATION -Group or individual verbal, written, or video instructions that support the educational goals of the pulmonary rehab program.   Knowledge Questionnaire Score:   Core Components/Risk Factors/Patient Goals at Admission:     Personal Goals and Risk Factors at Admission - 07/23/16 1052      Core Components/Risk Factors/Patient Goals on Admission    Weight Management Yes;Weight Loss   Intervention Weight Management: Develop a combined nutrition and exercise program designed to reach desired caloric intake, while maintaining appropriate intake of nutrient and fiber, sodium and fats, and appropriate energy expenditure required for the weight goal.;Weight Management: Provide education and appropriate resources to help participant work on and attain dietary goals.   Expected Outcomes Short Term: Continue to assess and modify interventions until short term weight is achieved;Understanding of distribution of calorie intake throughout the day with the consumption of 4-5 meals/snacks;Understanding recommendations for meals to include  15-35% energy as protein, 25-35% energy from fat, 35-60% energy from carbohydrates, less than 200mg  of dietary cholesterol, 20-35 gm of total fiber daily;Weight Loss: Understanding of general recommendations for a balanced deficit meal plan, which promotes 1-2 lb weight loss per week and includes a negative energy balance of [phone] kcal/d   Increase Strength and Stamina Yes   Intervention Provide advice, education, support and counseling about physical activity/exercise needs.;Develop an individualized exercise prescription for aerobic and resistive training based on initial evaluation findings, risk stratification, comorbidities and participant's personal goals.   Expected Outcomes Achievement of increased cardiorespiratory fitness and enhanced flexibility, muscular endurance and strength shown through measurements of functional capacity and personal statement of participant.   Improve shortness of breath with ADL's Yes   Intervention Provide education, individualized exercise plan and daily activity instruction to help decrease symptoms of SOB with activities of daily living.   Expected Outcomes Short Term: Achieves a reduction of symptoms when performing activities of daily living.      Core Components/Risk Factors/Patient Goals Review:      Goals and  Risk Factor Review    Row Name 08/09/16 1158 09/07/16 0719 10/05/16 1417         Core Components/Risk Factors/Patient Goals Review   Personal Goals Review Weight Management/Obesity;Improve shortness of breath with ADL's Weight Management/Obesity;Improve shortness of breath with ADL's Weight Management/Obesity;Improve shortness of breath with ADL's     Review see "comments" section on ITP see "comments" section on ITP see "comments" section on ITP     Expected Outcomes see admission expected outcomes see admission expected outcomes see admission expected outcomes        Core Components/Risk Factors/Patient Goals at Discharge (Final Review):       Goals and Risk Factor Review - 10/05/16 1417      Core Components/Risk Factors/Patient Goals Review   Personal Goals Review Weight Management/Obesity;Improve shortness of breath with ADL's   Review see "comments" section on ITP   Expected Outcomes see admission expected outcomes      ITP Comments:   Comments: ITP REVIEW Pt is making expected progress toward pulmonary rehab goals after completing 21 sessions. She has missed several sessions over the last several weeks. She spent a week at the beach with her niece recently. She said she did well with the heat and walking on the beach. She continues to struggle with chronic rib pain and the emotional response from not being able to do what she used to prior to her work accident. She denies the need for counseling. Recommend continued exercise, life style modification, education, and utilization of breathing techniques to increase stamina and strength and decrease shortness of breath with exertion.

## 2016-11-04 ENCOUNTER — Encounter (HOSPITAL_COMMUNITY): Payer: Worker's Compensation

## 2016-11-04 ENCOUNTER — Encounter (HOSPITAL_COMMUNITY)
Admission: RE | Admit: 2016-11-04 | Discharge: 2016-11-04 | Disposition: A | Discharge status: Home / Self Care | Payer: Worker's Compensation | Source: Ambulatory Visit | Attending: Internal Medicine | Admitting: Internal Medicine

## 2016-11-04 VITALS — Wt 166.2 lb

## 2016-11-04 DIAGNOSIS — I2699 Other pulmonary embolism without acute cor pulmonale: Secondary | ICD-10-CM

## 2016-11-04 DIAGNOSIS — R0602 Shortness of breath: Secondary | ICD-10-CM | POA: Diagnosis not present

## 2016-11-04 NOTE — Progress Notes (Signed)
Daily Session Note  Patient Details  Name: Holly Bowen MRN: [id number] Date of Birth: [birth year] Referring Provider:     Pulmonary Rehab Walk Test from 07/29/2016 in [place]  Referring Provider  Dr. Nelda Marseille      Encounter Date: 11/04/2016  Check In:     Session Check In - 11/04/16 1022      Check-In   Location [facility]   Staff Present Trish Fountain, RN, Maxcine Ham, RN, BSN;Journei Thomassen, MS, ACSM RCEP, Exercise Physiologist;Lisa Ysidro Evert, RN   Supervising physician immediately available to respond to emergencies [facility] immediately available   Physician(s) Dr. Posey Pronto   Medication changes reported     No   Fall or balance concerns reported    No   Tobacco Cessation No Change   Warm-up and Cool-down Performed as group-led instruction   Resistance Training Performed Yes   VAD Patient? No     Pain Assessment   Currently in Pain? No/denies      Capillary Blood Glucose: No results found for this or any previous visit (from the past 24 hour(s)).      Exercise Prescription Changes - 11/04/16 1200      Response to Exercise   Blood Pressure (Admit) 104/60   Blood Pressure (Exercise) 136/70   Blood Pressure (Exit) 120/70   Heart Rate (Admit) 64 bpm   Heart Rate (Exercise) 88 bpm   Heart Rate (Exit) 73 bpm   Oxygen Saturation (Admit) 98 %   Oxygen Saturation (Exercise) 95 %   Oxygen Saturation (Exit) 97 %   Rating of Perceived Exertion (Exercise) 12   Perceived Dyspnea (Exercise) 1   Duration Progress to 45 minutes of aerobic exercise without signs/symptoms of physical distress   Intensity THRR unchanged     Progression   Progression Continue to progress workloads to maintain intensity without signs/symptoms of physical distress.     Resistance Training   Training Prescription Yes   Weight orange bands   Reps 10-15   Time 10 Minutes     Interval Training   Interval Training No     Bike   Level  4.5   Minutes 17     NuStep   Level 7   Minutes 17   METs 3.6     Track   Laps 18   Minutes 17      History  Smoking Status  . Never Smoker  Smokeless Tobacco  . Never Used    Goals Met:  Exercise tolerated well No report of cardiac concerns or symptoms Strength training completed today  Goals Unmet:  Not Applicable  Comments: Service time is from 10:30a to 12:10p    Dr. Rush Farmer is Medical Director for Pulmonary Rehab at [facility].

## 2016-11-09 ENCOUNTER — Encounter (HOSPITAL_COMMUNITY)
Admission: RE | Admit: 2016-11-09 | Discharge: 2016-11-09 | Disposition: A | Discharge status: Home / Self Care | Payer: Worker's Compensation | Source: Ambulatory Visit | Attending: Internal Medicine | Admitting: Internal Medicine

## 2016-11-09 VITALS — Wt 168.7 lb

## 2016-11-09 DIAGNOSIS — I2699 Other pulmonary embolism without acute cor pulmonale: Secondary | ICD-10-CM

## 2016-11-09 DIAGNOSIS — R0602 Shortness of breath: Secondary | ICD-10-CM | POA: Diagnosis not present

## 2016-11-09 NOTE — Progress Notes (Signed)
Daily Session Note  Patient Details  Name: Holly Bowen MRN: [id number] Date of Birth: Mar 07, 1956 Referring Provider:     Pulmonary Rehab Walk Test from 07/29/2016 in [place]  Referring Provider  Dr. Nelda Marseille      Encounter Date: 11/09/2016  Check In:     Session Check In - 11/09/16 1017      Check-In   [facility]   Staff Present Trish Fountain, RN, Maxcine Ham, RN, BSN;Ilijah Doucet, MS, ACSM RCEP, Exercise Physiologist   Supervising physician immediately available to respond to emergencies [facility] immediately available   Physician(s) Dr. Posey Pronto   Medication changes reported     No   Fall or balance concerns reported    No   Tobacco Cessation No Change   Warm-up and Cool-down Performed as group-led instruction   Resistance Training Performed Yes   VAD Patient? No     Pain Assessment   Currently in Pain? No/denies      Capillary Blood Glucose: No results found for this or any previous visit (from the past 24 hour(s)).      Exercise Prescription Changes - 11/09/16 1200      Response to Exercise   Blood Pressure (Admit) 120/70   Blood Pressure (Exercise) 148/76   Blood Pressure (Exit) 116/70   Heart Rate (Admit) 64 bpm   Heart Rate (Exercise) 82 bpm   Heart Rate (Exit) 61 bpm   Oxygen Saturation (Admit) 97 %   Oxygen Saturation (Exercise) 95 %   Oxygen Saturation (Exit) 98 %   Rating of Perceived Exertion (Exercise) 11   Perceived Dyspnea (Exercise) 1   Duration Progress to 45 minutes of aerobic exercise without signs/symptoms of physical distress   Intensity THRR unchanged     Progression   Progression Continue to progress workloads to maintain intensity without signs/symptoms of physical distress.     Resistance Training   Training Prescription Yes   Weight orange bands   Reps 10-15   Time 10 Minutes     Interval Training   Interval Training No     Bike   Level 4.5   Minutes 17      NuStep   Level 7   Minutes 17   METs 3.8     Track   Laps 18   Minutes 17      History  Smoking Status  . Never Smoker  Smokeless Tobacco  . Never Used    Goals Met:  Exercise tolerated well No report of cardiac concerns or symptoms Strength training completed today  Goals Unmet:  Not Applicable  Comments: Service time is from 10:30a to 12:30p    Dr. Rush Farmer is Medical Director for Pulmonary Rehab at [facility].

## 2016-11-11 ENCOUNTER — Encounter (HOSPITAL_COMMUNITY): Payer: Worker's Compensation

## 2016-11-16 ENCOUNTER — Encounter (HOSPITAL_COMMUNITY)
Admission: RE | Admit: 2016-11-16 | Discharge: 2016-11-16 | Disposition: A | Discharge status: Home / Self Care | Payer: Worker's Compensation | Source: Ambulatory Visit | Attending: Internal Medicine | Admitting: Internal Medicine

## 2016-11-16 VITALS — Wt 168.7 lb

## 2016-11-16 DIAGNOSIS — R0602 Shortness of breath: Secondary | ICD-10-CM | POA: Diagnosis not present

## 2016-11-16 DIAGNOSIS — I2699 Other pulmonary embolism without acute cor pulmonale: Secondary | ICD-10-CM

## 2016-11-16 NOTE — Progress Notes (Signed)
Daily Session Note  Patient Details  Name: Holly Bowen MRN: [id number] Date of Birth: 1955-10-16 Referring Provider:     Pulmonary Rehab Walk Test from 07/29/2016 in [place]  Referring Provider  Dr. Nelda Marseille      Encounter Date: 11/16/2016  Check In:     Session Check In - 11/16/16 1009      Check-In   [facility]   Staff Present Rosebud Poles, RN, BSN;Aasha Dina, MS, ACSM RCEP, Exercise Physiologist;Portia Rollene Rotunda, RN, BSN   Supervising physician immediately available to respond to emergencies [facility] immediately available   Physician(s) Dr. Allyson Sabal   Medication changes reported     No   Fall or balance concerns reported    No   Tobacco Cessation No Change   Warm-up and Cool-down Performed as group-led instruction   Resistance Training Performed Yes   VAD Patient? No     Pain Assessment   Currently in Pain? No/denies   Multiple Pain Sites No      Capillary Blood Glucose: No results found for this or any previous visit (from the past 24 hour(s)).      Exercise Prescription Changes - 11/16/16 1200      Response to Exercise   Blood Pressure (Admit) 140/80   Blood Pressure (Exercise) 138/78   Blood Pressure (Exit) 122/60   Heart Rate (Admit) 64 bpm   Heart Rate (Exercise) 85 bpm   Heart Rate (Exit) 62 bpm   Oxygen Saturation (Admit) 96 %   Oxygen Saturation (Exercise) 95 %   Oxygen Saturation (Exit) 97 %   Rating of Perceived Exertion (Exercise) 11   Perceived Dyspnea (Exercise) 1   Duration Progress to 45 minutes of aerobic exercise without signs/symptoms of physical distress   Intensity THRR unchanged     Progression   Progression Continue to progress workloads to maintain intensity without signs/symptoms of physical distress.     Resistance Training   Training Prescription Yes   Weight orange bands   Reps 10-15   Time 10 Minutes     Interval Training   Interval Training No     Bike    Level 4.5   Minutes 17     NuStep   Level 7   Minutes 17   METs 2.3     Track   Laps 20   Minutes 17      History  Smoking Status  . Never Smoker  Smokeless Tobacco  . Never Used    Goals Met:  Exercise tolerated well No report of cardiac concerns or symptoms Strength training completed today  Goals Unmet:  Not Applicable  Comments: Service time is from 10:30a to 12:00p    Dr. Rush Farmer is Medical Director for Pulmonary Rehab at [facility].

## 2016-11-18 ENCOUNTER — Encounter (HOSPITAL_COMMUNITY): Payer: Worker's Compensation

## 2016-11-18 ENCOUNTER — Encounter (HOSPITAL_COMMUNITY)
Admission: RE | Admit: 2016-11-18 | Discharge: 2016-11-18 | Disposition: A | Discharge status: Home / Self Care | Payer: Worker's Compensation | Source: Ambulatory Visit | Attending: Internal Medicine | Admitting: Internal Medicine

## 2016-11-18 DIAGNOSIS — R0602 Shortness of breath: Secondary | ICD-10-CM | POA: Diagnosis not present

## 2016-11-18 DIAGNOSIS — I2699 Other pulmonary embolism without acute cor pulmonale: Secondary | ICD-10-CM

## 2016-11-18 NOTE — Progress Notes (Signed)
Daily Session Note  Patient Details  Name: Holly Bowen MRN: [id number] Date of Birth: [birth year] Referring Provider:     Pulmonary Rehab Walk Test from 07/29/2016 in [place]  Referring Provider  Dr. Nelda Marseille      Encounter Date: 11/18/2016  Check In:     Session Check In - 11/18/16 1030      Check-In   [facility]   Staff Present Rosebud Poles, RN, BSN;Moncerrath Berhe, MS, ACSM RCEP, Exercise Physiologist;Portia Rollene Rotunda, RN, BSN   Supervising physician immediately available to respond to emergencies [facility] immediately available   Physician(s) Dr. Allyson Sabal   Medication changes reported     No   Fall or balance concerns reported    No   Tobacco Cessation No Change   Warm-up and Cool-down Performed as group-led instruction   Resistance Training Performed Yes   VAD Patient? No     Pain Assessment   Currently in Pain? No/denies   Multiple Pain Sites No      Capillary Blood Glucose: No results found for this or any previous visit (from the past 24 hour(s)).      Exercise Prescription Changes - 11/18/16 1200      Response to Exercise   Blood Pressure (Admit) 122/68   Blood Pressure (Exercise) 144/62   Blood Pressure (Exit) 104/60   Heart Rate (Admit) 63 bpm   Heart Rate (Exercise) 86 bpm   Heart Rate (Exit) 68 bpm   Oxygen Saturation (Admit) 97 %   Oxygen Saturation (Exercise) 95 %   Oxygen Saturation (Exit) 97 %   Rating of Perceived Exertion (Exercise) 11   Perceived Dyspnea (Exercise) 1   Duration Progress to 45 minutes of aerobic exercise without signs/symptoms of physical distress   Intensity THRR unchanged     Progression   Progression Continue to progress workloads to maintain intensity without signs/symptoms of physical distress.     Resistance Training   Training Prescription Yes   Weight orange bands   Reps 10-15   Time 10 Minutes     Interval Training   Interval Training No     NuStep   Level 7   Minutes 17   METs 2     Track   Laps 20   Minutes 17      History  Smoking Status  . Never Smoker  Smokeless Tobacco  . Never Used    Goals Met:  Exercise tolerated well No report of cardiac concerns or symptoms Strength training completed today  Goals Unmet:  Not Applicable  Comments: Service time is from 10:30a to 12:20p    Dr. Rush Farmer is Medical Director for Pulmonary Rehab at [facility].

## 2016-11-23 ENCOUNTER — Encounter (HOSPITAL_COMMUNITY)
Admission: RE | Admit: 2016-11-23 | Payer: Worker's Compensation | Source: Ambulatory Visit | Attending: Internal Medicine | Admitting: Internal Medicine

## 2016-11-25 ENCOUNTER — Encounter (HOSPITAL_COMMUNITY)
Admission: RE | Admit: 2016-11-25 | Discharge: 2016-11-25 | Disposition: A | Discharge status: Home / Self Care | Payer: Worker's Compensation | Source: Ambulatory Visit | Attending: Internal Medicine | Admitting: Internal Medicine

## 2016-11-25 ENCOUNTER — Encounter (HOSPITAL_COMMUNITY): Payer: Worker's Compensation

## 2016-11-25 VITALS — Wt 168.9 lb

## 2016-11-25 DIAGNOSIS — R0602 Shortness of breath: Secondary | ICD-10-CM | POA: Diagnosis not present

## 2016-11-25 DIAGNOSIS — I2699 Other pulmonary embolism without acute cor pulmonale: Secondary | ICD-10-CM

## 2016-11-25 NOTE — Progress Notes (Signed)
Daily Session Note  Patient Details  Name: Holly Bowen MRN: [id number] Date of Birth: [birth year] Referring Provider:     Pulmonary Rehab Walk Test from 07/29/2016 in [place]  Referring Provider  Dr. Nelda Marseille      Encounter Date: 11/25/2016  Check In:     Session Check In - 11/25/16 1105      Check-In   [facility]   Staff Present Su Hilt, MS, ACSM RCEP, Exercise Physiologist;Lisa Ysidro Evert, Felipe Drone, RN, [place] physician immediately available to respond to emergencies [facility] immediately available   Physician(s) DR. Abrol   Medication changes reported     No   Fall or balance concerns reported    No   Tobacco Cessation No Change   Warm-up and Cool-down Performed as group-led instruction   Resistance Training Performed Yes   VAD Patient? No     Pain Assessment   Currently in Pain? No/denies   Multiple Pain Sites No      Capillary Blood Glucose: No results found for this or any previous visit (from the past 24 hour(s)).      Exercise Prescription Changes - 11/25/16 1200      Response to Exercise   Blood Pressure (Admit) 124/72   Blood Pressure (Exercise) 140/80   Blood Pressure (Exit) 118/70   Heart Rate (Admit) 70 bpm   Heart Rate (Exercise) 71 bpm   Heart Rate (Exit) 65 bpm   Oxygen Saturation (Admit) 97 %   Oxygen Saturation (Exercise) 94 %   Oxygen Saturation (Exit) 96 %   Rating of Perceived Exertion (Exercise) 11   Perceived Dyspnea (Exercise) 1   Duration Progress to 45 minutes of aerobic exercise without signs/symptoms of physical distress   Intensity THRR unchanged     Progression   Progression Continue to progress workloads to maintain intensity without signs/symptoms of physical distress.     Resistance Training   Training Prescription Yes   Weight orange bands   Reps 10-15   Time 10 Minutes     Interval Training   Interval Training No     Track   Laps 33   Minutes 17      History  Smoking Status  . Never Smoker  Smokeless Tobacco  . Never Used    Goals Met:  Exercise tolerated well No report of cardiac concerns or symptoms Strength training completed today  Goals Unmet:  Not Applicable  Comments: Service time is from 10:30a to 12:05p    Dr. Rush Farmer is Medical Director for Pulmonary Rehab at [facility].

## 2016-11-30 ENCOUNTER — Encounter (HOSPITAL_COMMUNITY)
Admission: RE | Admit: 2016-11-30 | Discharge: 2016-11-30 | Disposition: A | Discharge status: Home / Self Care | Payer: Worker's Compensation | Source: Ambulatory Visit | Attending: Internal Medicine | Admitting: Internal Medicine

## 2016-11-30 VITALS — Wt 168.7 lb

## 2016-11-30 DIAGNOSIS — R0602 Shortness of breath: Secondary | ICD-10-CM | POA: Insufficient documentation

## 2016-11-30 DIAGNOSIS — I2699 Other pulmonary embolism without acute cor pulmonale: Secondary | ICD-10-CM

## 2016-11-30 NOTE — Progress Notes (Signed)
Daily Session Note  Patient Details  Name: Holly Bowen MRN: [id number] Date of Birth: [birth year] Referring Provider:     Pulmonary Rehab Walk Test from 07/29/2016 in [place]  Referring Provider  Dr. Nelda Marseille      Encounter Date: 11/30/2016  Check In:     Session Check In - 11/30/16 1030      Check-In   [facility]   Staff Present Su Hilt, MS, ACSM RCEP, Exercise Physiologist;Lisa Ysidro Evert, Felipe Drone, RN, MHA;Portia Rollene Rotunda, RN, BSN   Supervising physician immediately available to respond to emergencies [facility] immediately available   Physician(s) DR. Abrol   Medication changes reported     No   Fall or balance concerns reported    No   Tobacco Cessation No Change   Warm-up and Cool-down Performed as group-led instruction   Resistance Training Performed Yes   VAD Patient? No     Pain Assessment   Currently in Pain? No/denies      Capillary Blood Glucose: No results found for this or any previous visit (from the past 24 hour(s)).      Exercise Prescription Changes - 11/30/16 1200      Response to Exercise   Blood Pressure (Admit) 130/70   Blood Pressure (Exercise) 158/80   Blood Pressure (Exit) 124/70   Heart Rate (Admit) 60 bpm   Heart Rate (Exercise) 97 bpm   Heart Rate (Exit) 63 bpm   Oxygen Saturation (Admit) 96 %   Oxygen Saturation (Exercise) 91 %   Oxygen Saturation (Exit) 97 %   Rating of Perceived Exertion (Exercise) 11   Perceived Dyspnea (Exercise) 1   Duration Progress to 45 minutes of aerobic exercise without signs/symptoms of physical distress   Intensity THRR unchanged     Progression   Progression Continue to progress workloads to maintain intensity without signs/symptoms of physical distress.     Resistance Training   Training Prescription Yes   Weight orange bands   Reps 10-15   Time 10 Minutes     Interval Training   Interval Training No     Bike   Level 4.5   Minutes 17     NuStep   Level 7   Minutes 17   METs 3     Track   Laps 17   Minutes 17      History  Smoking Status  . Never Smoker  Smokeless Tobacco  . Never Used    Goals Met:  Exercise tolerated well Strength training completed today  Goals Unmet:  Not Applicable  Comments: Service time is from 1030 to 1210    Dr. Rush Farmer is Medical Director for Pulmonary Rehab at [facility].

## 2016-12-02 ENCOUNTER — Encounter (HOSPITAL_COMMUNITY)
Admission: RE | Admit: 2016-12-02 | Discharge: 2016-12-02 | Disposition: A | Discharge status: Home / Self Care | Payer: Worker's Compensation | Source: Ambulatory Visit | Attending: Internal Medicine | Admitting: Internal Medicine

## 2016-12-02 ENCOUNTER — Encounter (HOSPITAL_COMMUNITY): Payer: Worker's Compensation

## 2016-12-02 VITALS — Wt 170.2 lb

## 2016-12-02 DIAGNOSIS — I2699 Other pulmonary embolism without acute cor pulmonale: Secondary | ICD-10-CM

## 2016-12-02 DIAGNOSIS — R0602 Shortness of breath: Secondary | ICD-10-CM | POA: Diagnosis not present

## 2016-12-02 NOTE — Progress Notes (Signed)
Pulmonary Individual Treatment Plan  Patient Details  Name: Holly Bowen MRN: [id number] Date of Birth: 1956-04-12 Referring Provider:     Pulmonary Rehab Walk Test from 07/29/2016 in [place]  Referring Provider  Dr. Nelda Marseille      Initial Encounter Date:    Pulmonary Rehab Walk Test from 07/29/2016 in [place]  Date  07/29/16  Referring Provider  Dr. Nelda Marseille      Visit Diagnosis: Other acute pulmonary embolism without acute cor pulmonale ([place])  Patient's Home Medications on Admission:   Current Outpatient Prescriptions:  .  acetaminophen (TYLENOL) 325 MG tablet, Take 650 mg by mouth., Disp: , Rfl:  .  busPIRone (BUSPAR) 30 MG tablet, Take by mouth., Disp: , Rfl:  .  Cyanocobalamin 1000 MCG/ML LIQD, Take by mouth., Disp: , Rfl:  .  lisinopril-hydrochlorothiazide (PRINZIDE) 20-12.5 MG per tablet, Take by mouth., Disp: , Rfl:  .  metoprolol (LOPRESSOR) 50 MG tablet, Take 50 mg by mouth., Disp: , Rfl:  .  Misc. Devices MISC, Patient is still unable to return to work at this time., Disp: , Rfl:  .  oxycodone (OXY-IR) 5 MG capsule, Take 5 mg by mouth., Disp: , Rfl:  .  oxyCODONE (OXYCONTIN) 10 mg 12 hr tablet, Take by mouth., Disp: , Rfl:  .  risperiDONE (RISPERDAL) 0.25 MG tablet, Take 0.25 mg by mouth. Patient only takes 1 tab at hour of sleep, Disp: , Rfl:  .  rivaroxaban (XARELTO) 20 MG TABS tablet, Take by mouth., Disp: , Rfl:   Past Medical History: Past Medical History:  Diagnosis Date  . Hypertension     Tobacco Use: History  Smoking Status  . Never Smoker  Smokeless Tobacco  . Never Used    Labs: Recent Review Flowsheet Data    Labs for ITP Cardiac and Pulmonary Rehab Latest Ref Rng & Units 08/20/2008 10/13/2009   TCO2 0 - 100 mmol/L 26 27      Capillary Blood Glucose: No results found for: GLUCAP   ADL UCSD:   Pulmonary Function Assessment:     Pulmonary Function Assessment - 07/23/16 1051       Breath   Bilateral Breath Sounds Clear   Shortness of Breath Yes;Limiting activity      Exercise Target Goals:    Exercise Program Goal: Individual exercise prescription set with THRR, safety & activity barriers. Participant demonstrates ability to understand and report RPE using BORG scale, to self-measure pulse accurately, and to acknowledge the importance of the exercise prescription.  Exercise Prescription Goal: Starting with aerobic activity 30 plus minutes a day, 3 days per week for initial exercise prescription. Provide home exercise prescription and guidelines that participant acknowledges understanding prior to discharge.  Activity Barriers & Risk Stratification:     Activity Barriers & Cardiac Risk Stratification - 07/23/16 1050      Activity Barriers & Cardiac Risk Stratification   Activity Barriers Back Problems;Shortness of Breath;Deconditioning;Other (comment)   Comments chronic pain      6 Minute Walk:     6 Minute Walk    Row Name 07/29/16 1657         6 Minute Walk   Phase Initial     Distance 1490 feet     Walk Time 6 minutes     # of Rest Breaks 0     MPH 2.82     METS 3.14     RPE 11     Perceived  Dyspnea  1     Symptoms No     Resting HR 77 bpm     Resting BP 132/88     Max Ex. HR 100 bpm     Max Ex. BP 160/100     2 Minute Post BP 155/99       Interval HR   Baseline HR 77     1 Minute HR 89     2 Minute HR 95     3 Minute HR 97     4 Minute HR 99     5 Minute HR 98     6 Minute HR 100     2 Minute Post HR 81     Interval Heart Rate? Yes       Interval Oxygen   Interval Oxygen? Yes     Baseline Oxygen Saturation % 95 %     Baseline Liters of Oxygen 0 L     1 Minute Oxygen Saturation % 93 %     1 Minute Liters of Oxygen 0 L     2 Minute Oxygen Saturation % 92 %     2 Minute Liters of Oxygen 0 L     3 Minute Oxygen Saturation % 93 %     3 Minute Liters of Oxygen 0 L     4 Minute Oxygen Saturation % 93 %     4 Minute  Liters of Oxygen 0 L     5 Minute Oxygen Saturation % 94 %     5 Minute Liters of Oxygen 0 L     6 Minute Oxygen Saturation % 94 %     6 Minute Liters of Oxygen 0 L     2 Minute Post Oxygen Saturation % 96 %     2 Minute Post Liters of Oxygen 0 L        Oxygen Initial Assessment:     Oxygen Initial Assessment - 08/09/16 1157      Home Oxygen   Home Oxygen Device None     Initial 6 min Walk   Oxygen Used None     Program Oxygen Prescription   Program Oxygen Prescription None      Oxygen Re-Evaluation:     Oxygen Re-Evaluation    Row Name 09/07/16 0718 10/05/16 1417 11/02/16 0851 11/30/16 1535       Program Oxygen Prescription   Program Oxygen Prescription None None None None      Home Oxygen   Home Oxygen Device None None None None       Oxygen Discharge (Final Oxygen Re-Evaluation):     Oxygen Re-Evaluation - 11/30/16 1535      Program Oxygen Prescription   Program Oxygen Prescription None     Home Oxygen   Home Oxygen Device None      Initial Exercise Prescription:     Initial Exercise Prescription - 07/29/16 1700      Date of Initial Exercise RX and Referring Provider   Date 07/29/16   Referring Provider Dr. Nelda Marseille     Bike   Level 2   Minutes 17     NuStep   Level 2   Minutes 17   METs 1.5     Track   Laps 8   Minutes 17     Prescription Details   Frequency (times per week) 2   Duration Progress to 45 minutes of aerobic exercise without signs/symptoms of physical distress     Intensity  THRR 40-80% of Max Heartrate 63-126   Ratings of Perceived Exertion 11-13   Perceived Dyspnea 0-4     Progression   Progression Continue progressive overload as per policy without signs/symptoms or physical distress.     Resistance Training   Training Prescription Yes   Weight orange bands   Reps 10-15      Perform Capillary Blood Glucose checks as needed.  Exercise Prescription Changes:     Exercise Prescription Changes    Row  Name 08/05/16 1200 08/10/16 1200 08/12/16 1300 08/17/16 1225 08/19/16 1200     Response to Exercise   Blood Pressure (Admit) 142/64 128/80 130/70 124/74 114/68   Blood Pressure (Exercise) 128/82 122/66 142/80 160/80 162/80   Blood Pressure (Exit) 122/70 122/70 124/70 130/76 122/72   Heart Rate (Admit) 96 bpm 67 bpm 75 bpm 69 bpm 72 bpm   Heart Rate (Exercise) 85 bpm 76 bpm 80 bpm 84 bpm 83 bpm   Heart Rate (Exit) 74 bpm 62 bpm 71 bpm 57 bpm 69 bpm   Oxygen Saturation (Admit) 96 % 95 % 98 % 95 % 97 %   Oxygen Saturation (Exercise) 97 % 95 % 96 % 95 % 94 %   Oxygen Saturation (Exit) 96 % 96 % 96 % 95 % 97 %   Rating of Perceived Exertion (Exercise) 11 11 9 14 11    Perceived Dyspnea (Exercise) 1 1 1 1 1    Duration Continue with 45 min of aerobic exercise without signs/symptoms of physical distress. Progress to 45 minutes of aerobic exercise without signs/symptoms of physical distress Progress to 45 minutes of aerobic exercise without signs/symptoms of physical distress Progress to 45 minutes of aerobic exercise without signs/symptoms of physical distress Progress to 45 minutes of aerobic exercise without signs/symptoms of physical distress   Intensity THRR unchanged THRR unchanged THRR unchanged THRR unchanged THRR unchanged     Progression   Progression Continue to progress workloads to maintain intensity without signs/symptoms of physical distress. Continue to progress workloads to maintain intensity without signs/symptoms of physical distress. Continue to progress workloads to maintain intensity without signs/symptoms of physical distress. Continue to progress workloads to maintain intensity without signs/symptoms of physical distress. Continue to progress workloads to maintain intensity without signs/symptoms of physical distress.     Resistance Training   Training Prescription Yes Yes Yes Yes Yes   Weight orange bands orange bands orange bands orange bands orange bands   Reps 10-15 10-15  10-15 10-15 10-15   Time  - 10 Minutes 10 Minutes 10 Minutes 10 Minutes     Bike   Level 2 2 2 2 3    Minutes 17 17 17 17 17      NuStep   Level  - 3 3 5   -   Minutes  - 17 17 17   -   METs  -  - 2.3 2.4  -     Track   Laps 16 14  - 16 16   Minutes 17 17  - 17 17   Row Name 08/24/16 1200 08/26/16 1200 09/07/16 1200 09/09/16 1200 09/14/16 1223     Response to Exercise   Blood Pressure (Admit) 110/70 120/64 116/64 132/74 140/84   Blood Pressure (Exercise) 156/84 128/60 140/74 140/76 140/72   Blood Pressure (Exit) 122/70 118/70 122/72 124/50 142/74   Heart Rate (Admit) 75 bpm 79 bpm 69 bpm 78 bpm 82 bpm   Heart Rate (Exercise) 90 bpm 96 bpm 94 bpm 91 bpm 111 bpm  Heart Rate (Exit) 77 bpm 75 bpm 71 bpm 74 bpm 86 bpm   Oxygen Saturation (Admit) 95 % 96 % 96 % 95 % 96 %   Oxygen Saturation (Exercise) 96 % 96 % 95 % 96 % 91 %   Oxygen Saturation (Exit) 97 % 96 % 96 % 95 % 96 %   Rating of Perceived Exertion (Exercise) 13 12 13 12 12    Perceived Dyspnea (Exercise) 1 1 2 2 1    Duration Progress to 45 minutes of aerobic exercise without signs/symptoms of physical distress Progress to 45 minutes of aerobic exercise without signs/symptoms of physical distress Progress to 45 minutes of aerobic exercise without signs/symptoms of physical distress Progress to 45 minutes of aerobic exercise without signs/symptoms of physical distress Progress to 45 minutes of aerobic exercise without signs/symptoms of physical distress   Intensity THRR unchanged THRR unchanged THRR unchanged THRR unchanged THRR unchanged     Progression   Progression Continue to progress workloads to maintain intensity without signs/symptoms of physical distress. Continue to progress workloads to maintain intensity without signs/symptoms of physical distress. Continue to progress workloads to maintain intensity without signs/symptoms of physical distress. Continue to progress workloads to maintain intensity without signs/symptoms of  physical distress. Continue to progress workloads to maintain intensity without signs/symptoms of physical distress.     Resistance Training   Training Prescription Yes Yes Yes Yes Yes   Weight orange bands orange bands orange bands orange bands orange bands   Reps 10-15 10-15 10-15 10-15 10-15   Time 10 Minutes 10 Minutes 10 Minutes 10 Minutes 10 Minutes     Interval Training   Interval Training  - No No No No     Bike   Level 4  - 4  - 4.5   Minutes 17  - 17  - 17     NuStep   Level 5 6 6  2.5 6   Minutes 17 17 17 17 17    METs 2.1 2.6 2 2.5 2.8     Track   Laps 15 16 18 19 17    Minutes 17 17 17 17 17      Home Exercise Plan   Plans to continue exercise at  -  - Home (comment)  -  -   Frequency  -  - Add 2 additional days to program exercise sessions.  -  -   Row Name 09/16/16 1200 09/21/16 1200 09/23/16 1200 09/28/16 1200 09/30/16 1200     Response to Exercise   Blood Pressure (Admit) 104/70 110/70 126/70 126/70 114/60   Blood Pressure (Exercise) 130/80 140/60 150/84 120/60 130/66   Blood Pressure (Exit) 126/70 118/62 130/74 114/76 120/74   Heart Rate (Admit) 77 bpm 68 bpm 65 bpm 81 bpm 72 bpm   Heart Rate (Exercise) 118 bpm 92 bpm 99 bpm 99 bpm 95 bpm   Heart Rate (Exit) 82 bpm 73 bpm 73 bpm 71 bpm 67 bpm   Oxygen Saturation (Admit) 94 % 96 % 96 % 96 % 95 %   Oxygen Saturation (Exercise) 92 % 94 % 94 % 96 % 97 %   Oxygen Saturation (Exit) 94 % 95 % 96 % 96 % 97 %   Rating of Perceived Exertion (Exercise) 12 12 13 11 11    Perceived Dyspnea (Exercise) 1 1 1 1 1    Duration Progress to 45 minutes of aerobic exercise without signs/symptoms of physical distress Progress to 45 minutes of aerobic exercise without signs/symptoms of physical distress Progress to  45 minutes of aerobic exercise without signs/symptoms of physical distress Progress to 45 minutes of aerobic exercise without signs/symptoms of physical distress Progress to 45 minutes of aerobic exercise without  signs/symptoms of physical distress   Intensity THRR unchanged THRR unchanged THRR unchanged THRR unchanged THRR unchanged     Progression   Progression Continue to progress workloads to maintain intensity without signs/symptoms of physical distress. Continue to progress workloads to maintain intensity without signs/symptoms of physical distress. Continue to progress workloads to maintain intensity without signs/symptoms of physical distress. Continue to progress workloads to maintain intensity without signs/symptoms of physical distress. Continue to progress workloads to maintain intensity without signs/symptoms of physical distress.     Resistance Training   Training Prescription Yes Yes Yes Yes Yes   Weight orange bands orange bands orange bands orange bands orange bands   Reps 10-15 10-15 10-15 10-15 10-15   Time 10 Minutes 10 Minutes 10 Minutes 10 Minutes 10 Minutes     Interval Training   Interval Training No No No No No     Bike   Level 4.5 4.5 4.5 4.5 4.5   Minutes 17 17 17 17 17      NuStep   Level 6 6  - 6 6   Minutes 17 17  - 17 17   METs 3.3 2.2  - 3.1 3     Track   Laps  - 20 22 22   -   Minutes  - 17 17 17   -   Row Name 10/07/16 1200 10/12/16 1200 10/14/16 1200 10/19/16 1200 10/21/16 1246     Response to Exercise   Blood Pressure (Admit) 128/66 118/72 132/86 120/64 120/60   Blood Pressure (Exercise) 140/80 110/70 124/72 140/70 140/76   Blood Pressure (Exit) 120/70 126/72 120/74 122/68 112/80   Heart Rate (Admit) 74 bpm 69 bpm 76 bpm 72 bpm 68 bpm   Heart Rate (Exercise) 88 bpm 86 bpm 99 bpm 95 bpm 98 bpm   Heart Rate (Exit) 67 bpm 67 bpm 73 bpm 75 bpm 71 bpm   Oxygen Saturation (Admit) 96 % 95 % 96 % 95 % 98 %   Oxygen Saturation (Exercise) 95 % 94 % 96 % 92 % 92 %   Oxygen Saturation (Exit) 95 % 97 % 96 % 97 % 97 %   Rating of Perceived Exertion (Exercise) 11 13 12 11 11    Perceived Dyspnea (Exercise) 1 1 1 1 1    Duration Progress to 45 minutes of aerobic  exercise without signs/symptoms of physical distress Progress to 45 minutes of aerobic exercise without signs/symptoms of physical distress Progress to 45 minutes of aerobic exercise without signs/symptoms of physical distress Progress to 45 minutes of aerobic exercise without signs/symptoms of physical distress Progress to 45 minutes of aerobic exercise without signs/symptoms of physical distress   Intensity THRR unchanged THRR unchanged THRR unchanged THRR unchanged THRR unchanged     Progression   Progression Continue to progress workloads to maintain intensity without signs/symptoms of physical distress. Continue to progress workloads to maintain intensity without signs/symptoms of physical distress. Continue to progress workloads to maintain intensity without signs/symptoms of physical distress. Continue to progress workloads to maintain intensity without signs/symptoms of physical distress. Continue to progress workloads to maintain intensity without signs/symptoms of physical distress.     Resistance Training   Training Prescription Yes Yes Yes Yes Yes   Weight porange bands porange bands orange bands orange bands orange bands   Reps 10-15 10-15 10-15  10-15 10-15   Time 10 Minutes 10 Minutes 10 Minutes 10 Minutes 10 Minutes     Interval Training   Interval Training No No No No No     Bike   Level 4.5 4.5 - 4.7 4.5   Minutes 17 17 - 17 17     NuStep   Level  - 7 7 7 7    Minutes  - 17 17 17 17    METs  - 3.1 2.2 2.8 2.7     Track   Laps 18 18 20 18   -   Minutes 17 17 17 17   -   Row Name 11/02/16 1200 11/04/16 1200 11/09/16 1200 11/16/16 1200 11/18/16 1200     Response to Exercise   Blood Pressure (Admit) 124/64 104/60 120/70 140/80 122/68   Blood Pressure (Exercise) 136/70 136/70 148/76 138/78 144/62   Blood Pressure (Exit) 124/70 120/70 116/70 122/60 104/60   Heart Rate (Admit) 66 bpm 64 bpm 64 bpm 64 bpm 63 bpm   Heart Rate (Exercise) 83 bpm 88 bpm 82 bpm 85 bpm 86 bpm    Heart Rate (Exit) 63 bpm 73 bpm 61 bpm 62 bpm 68 bpm   Oxygen Saturation (Admit) 97 % 98 % 97 % 96 % 97 %   Oxygen Saturation (Exercise) 94 % 95 % 95 % 95 % 95 %   Oxygen Saturation (Exit) 96 % 97 % 98 % 97 % 97 %   Rating of Perceived Exertion (Exercise) 11 12 11 11 11    Perceived Dyspnea (Exercise) 1 1 1 1 1    Duration Progress to 45 minutes of aerobic exercise without signs/symptoms of physical distress Progress to 45 minutes of aerobic exercise without signs/symptoms of physical distress Progress to 45 minutes of aerobic exercise without signs/symptoms of physical distress Progress to 45 minutes of aerobic exercise without signs/symptoms of physical distress Progress to 45 minutes of aerobic exercise without signs/symptoms of physical distress   Intensity THRR unchanged THRR unchanged THRR unchanged THRR unchanged THRR unchanged     Progression   Progression Continue to progress workloads to maintain intensity without signs/symptoms of physical distress. Continue to progress workloads to maintain intensity without signs/symptoms of physical distress. Continue to progress workloads to maintain intensity without signs/symptoms of physical distress. Continue to progress workloads to maintain intensity without signs/symptoms of physical distress. Continue to progress workloads to maintain intensity without signs/symptoms of physical distress.     Resistance Training   Training Prescription Yes Yes Yes Yes Yes   Weight orange bands orange bands orange bands orange bands orange bands   Reps 10-15 10-15 10-15 10-15 10-15   Time 10 Minutes 10 Minutes 10 Minutes 10 Minutes 10 Minutes     Interval Training   Interval Training No No No No No     Bike   Level 4.5 4.5 4.5 4.5  -   Minutes 17 17 17 17   -     NuStep   Level 7 7 7 7 7    Minutes 17 17 17 17 17    METs 2 3.6 3.8 2.3 2     Track   Laps 16 18 18 20 20    Minutes 17 17 17 17 17    Row Name 11/25/16 1200 11/30/16 1200 12/02/16 1200          Response to Exercise   Blood Pressure (Admit) 124/72 130/70 144/60     Blood Pressure (Exercise) 140/80 158/80 132/66     Blood Pressure (Exit) 118/70 124/70  124/80     Heart Rate (Admit) 70 bpm 60 bpm 57 bpm     Heart Rate (Exercise) 71 bpm 97 bpm 86 bpm     Heart Rate (Exit) 65 bpm 63 bpm 60 bpm     Oxygen Saturation (Admit) 97 % 96 % 98 %     Oxygen Saturation (Exercise) 94 % 91 % 96 %     Oxygen Saturation (Exit) 96 % 97 % 97 %     Rating of Perceived Exertion (Exercise) 11 11 11      Perceived Dyspnea (Exercise) 1 1 1      Duration Progress to 45 minutes of aerobic exercise without signs/symptoms of physical distress Progress to 45 minutes of aerobic exercise without signs/symptoms of physical distress Progress to 45 minutes of aerobic exercise without signs/symptoms of physical distress     Intensity THRR unchanged THRR unchanged THRR unchanged       Progression   Progression Continue to progress workloads to maintain intensity without signs/symptoms of physical distress. Continue to progress workloads to maintain intensity without signs/symptoms of physical distress. Continue to progress workloads to maintain intensity without signs/symptoms of physical distress.       Resistance Training   Training Prescription Yes Yes Yes     Weight orange bands orange bands orange bands     Reps 10-15 10-15 10-15     Time 10 Minutes 10 Minutes 10 Minutes       Interval Training   Interval Training No No No       Bike   Level  - 4.5  -     Minutes  - 17  -       NuStep   Level  - 7 7     Minutes  - 17 17     METs  - 3 3.2       Track   Laps 33 17 18     Minutes 17 17 17         Exercise Comments:     Exercise Comments    Row Name 09/07/16 1646           Exercise Comments Home exercise completed          Exercise Goals and Review:   Exercise Goals Re-Evaluation :     Exercise Goals Re-Evaluation    Row Name 08/10/16 0805 09/07/16 1025 09/07/16 0715 10/04/16  0748 11/01/16 1008     Exercise Goal Re-Evaluation   Exercise Goals Review Increase Physical Activity;Increase Strenth and Stamina Increase Physical Activity;Increase Strenth and Stamina  - Increase Physical Activity;Increase Strenth and Stamina Increase Strenth and Stamina;Increase Physical Activity   Comments Patient has only attended one exercise sessions. Will cont. to monitor and progress.  Patient is progressing in physical capacity. She states that she is limited by rib pain. I educated the patient on the RPE (rate of percieved exertion) and asked her to always maintain work loads at [employer] level but all the while managing her rib pain. She agreed. Will cont to monitor and progress.  - Patient is progressing in physical capacity. She states that she is limited by rib pain. I educated the patient on the RPE (rate of percieved exertion) and asked her to always maintain work loads at [employer] level but all the while managing her rib pain. She agreed. Will cont to monitor and progress. Will be transitioning to Pulmonary Maintenance soon if approved by her legal case manager., Patient is progressing in physical capacity. She  states that she is limited by rib pain. I educated the patient on the RPE (rate of percieved exertion) and asked her to always maintain work loads at [employer] level but all the while managing her rib pain. She agreed. Will cont to monitor and progress. Will be transitioning to Pulmonary Maintenance soon if approved by her legal case manager.,   Expected Outcomes Through exercise at rehab and home patient will increase physical capacity, stength, and stamina.  - Through exercise at rehab and home patient will increase physical capacity, stength, and stamina. Through exercise at rehab and home patient will increase physical capacity, stength, and stamina. Through the exercise here at rehab the patient with increase physical capacity, strength, and stamina.   [place] Name 11/30/16  1311             Exercise Goal Re-Evaluation   Exercise Goals Review Increase Physical Activity;Increase Strenth and Stamina       Comments She states that she is limited by rib pain. I educated the patient on the RPE (rate of percieved exertion) and asked her to always maintain work loads at [employer] level but all the while managing her rib pain. She agreed. Will cont to monitor and progress. Will be transitioning to Pulmonary Maintenance soon if approved by her legal case manager.,       Expected Outcomes Through the exercise here at rehab the patient with increase physical capacity, strength, and stamina.          Discharge Exercise Prescription (Final Exercise Prescription Changes):     Exercise Prescription Changes - 12/02/16 1200      Response to Exercise   Blood Pressure (Admit) 144/60   Blood Pressure (Exercise) 132/66   Blood Pressure (Exit) 124/80   Heart Rate (Admit) 57 bpm   Heart Rate (Exercise) 86 bpm   Heart Rate (Exit) 60 bpm   Oxygen Saturation (Admit) 98 %   Oxygen Saturation (Exercise) 96 %   Oxygen Saturation (Exit) 97 %   Rating of Perceived Exertion (Exercise) 11   Perceived Dyspnea (Exercise) 1   Duration Progress to 45 minutes of aerobic exercise without signs/symptoms of physical distress   Intensity THRR unchanged     Progression   Progression Continue to progress workloads to maintain intensity without signs/symptoms of physical distress.     Resistance Training   Training Prescription Yes   Weight orange bands   Reps 10-15   Time 10 Minutes     Interval Training   Interval Training No     NuStep   Level 7   Minutes 17   METs 3.2     Track   Laps 18   Minutes 17      Nutrition:  Target Goals: Understanding of nutrition guidelines, daily intake of sodium 1500mg , cholesterol 200mg , calories 30% from fat and 7% or less from saturated fats, daily to have 5 or more servings of fruits and vegetables.  Biometrics:     Pre  Biometrics - 08/19/16 1554      Pre Biometrics   Height 5\' 6"  (1.676 m)       Nutrition Therapy Plan and Nutrition Goals:     Nutrition Therapy & Goals - 08/17/16 1444      Nutrition Therapy   Diet Therapeutic Lifestyle Changes     Personal Nutrition Goals   Nutrition Goal Wt loss of 1-2 lb/week to a wt loss goal of 6-24 lb at graduation from [place]  Intervention Prescribe, educate and counsel regarding individualized specific dietary modifications aiming towards targeted core components such as weight, hypertension, lipid management, diabetes, heart failure and other comorbidities.   Expected Outcomes Short Term Goal: Understand basic principles of dietary content, such as calories, fat, sodium, cholesterol and nutrients.;Long Term Goal: Adherence to prescribed nutrition plan.      Nutrition Discharge: Rate Your Plate Scores:     Nutrition Assessments - 08/19/16 1226      Rate Your Plate Scores   Pre Score 53      Nutrition Goals Re-Evaluation:     Nutrition Goals Re-Evaluation    [place] Name 08/19/16 1236             Goals   Nutrition Goal Wt loss of 1-2 lb/week to a wt loss goal of 6-24 lb at graduation from [place] [facility] given 1500 kcal, 5 day menu ideas to help with wt loss goal as well as help pt schedule meal time.          Nutrition Goals Discharge (Final Nutrition Goals Re-Evaluation):     Nutrition Goals Re-Evaluation - 08/19/16 1236      Goals   Nutrition Goal Wt loss of 1-2 lb/week to a wt loss goal of 6-24 lb at graduation from [facility] Pt given 1500 kcal, 5 day menu ideas to help with wt loss goal as well as help pt schedule meal time.      Psychosocial: Target Goals: Acknowledge presence or absence of significant depression and/or stress, maximize coping skills, provide positive support system. Participant is able to verbalize types and ability to use techniques and skills needed  for reducing stress and depression.  Initial Review & Psychosocial Screening:     Initial Psych Review & Screening - 07/23/16 1053      Initial Review   Current issues with Current Stress Concerns   Source of Stress Concerns Chronic Illness;Unable to participate in former interests or hobbies;Unable to perform yard/household activities     [place]? Yes     Barriers   Psychosocial barriers to participate in program There are no identifiable barriers or psychosocial needs.     Screening Interventions   Interventions Encouraged to exercise      Quality of Life Scores:   PHQ-9: Recent Review Flowsheet Data    Depression screen [facility] 2/9 07/23/2016 08/08/2014 03/08/2014   Decreased Interest 1 2 3    Down, Depressed, Hopeless 0 0 3   PHQ - 2 Score 1 2 6    Altered sleeping - 0 3    Tired, decreased energy - 3 3   Change in appetite - 0 3    Feeling bad or failure about yourself  - 0 0   Trouble concentrating - 0 0   Moving slowly or fidgety/restless - 0 1    Suicidal thoughts - 0 0   PHQ-9 Score - 5 16     Interpretation of Total Score  Total Score Depression Severity:  1-4 = Minimal depression, 5-9 = Mild depression, 10-14 = Moderate depression, 15-19 = Moderately severe depression, 20-27 = Severe depression   Psychosocial Evaluation and Intervention:     Psychosocial Evaluation - 08/09/16 1159      Psychosocial Evaluation & Interventions   Interventions Encouraged to exercise with the program and follow exercise prescription   Continue Psychosocial Services  Follow up required by staff  Psychosocial Re-Evaluation:     Psychosocial Re-Evaluation    [place] Name 08/09/16 1159 09/07/16 0719 10/05/16 1417 11/02/16 0857 11/30/16 1540     Psychosocial Re-Evaluation   Current issues with Current Stress Concerns Current Stress Concerns Current Stress Concerns Current Stress Concerns Current Stress Concerns   Comments  -  - 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, Aeronautical engineer comp, 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, Web designer, 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, Aeronautical engineer comp, 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   Interventions Encouraged to attend Pulmonary Rehabilitation for the exercise;Stress management education Encouraged to attend Pulmonary Rehabilitation for the exercise;Stress management education Encouraged to attend Pulmonary Rehabilitation for the exercise;Stress management education Encouraged to attend Pulmonary Rehabilitation for the exercise;Stress management education Encouraged to attend Pulmonary Rehabilitation for the exercise;Stress management education   Continue Psychosocial Services  Follow up required by staff Follow up required by staff Follow up required by staff Follow up required by staff Follow up required by staff     Initial Review   Source of Stress Concerns  - Chronic Illness;Unable to participate in former interests or hobbies;Unable to perform yard/household activities Chronic Illness;Unable to participate in former interests or hobbies;Unable to perform yard/household activities Chronic Illness;Unable to participate in former interests or hobbies;Unable to perform yard/household activities Chronic Illness;Retirement/disability      Psychosocial Discharge (Final Psychosocial Re-Evaluation):     Psychosocial Re-Evaluation - 11/30/16 1540      Psychosocial Re-Evaluation   Current issues with Current Stress Concerns   Comments 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 Participant will state understanding/return demonstration of topics presented.  Learning Barriers/Preferences:     Learning Barriers/Preferences - 07/23/16 1050      Learning Barriers/Preferences   Learning Barriers None   Learning Preferences Audio;Computer/Internet;Group Instruction;Individual Instruction;Pictoral;Skilled Demonstration;Verbal Instruction;Video;Written Material      Education Topics: Risk Factor Reduction:  -Group instruction that is supported by a PowerPoint presentation. Instructor discusses the definition of a risk factor, different risk factors for pulmonary disease, and how the heart and lungs work together.     PULMONARY REHAB OTHER RESPIRATORY from 12/02/2016 in [place]  Date  08/19/16  Educator  EP  Instruction Review Code  2- meets goals/outcomes      Nutrition for Pulmonary Patient:  -Group instruction provided by PowerPoint slides, verbal discussion, and written materials to support subject matter. The instructor gives an explanation and review of healthy diet recommendations, which includes a discussion on weight management, recommendations for fruit and vegetable consumption, as well as protein, fluid, caffeine, fiber, sodium, sugar, and alcohol. Tips for eating when patients are short of breath are discussed.   PULMONARY REHAB OTHER RESPIRATORY from 12/02/2016 in [place]  Date  11/18/16  Educator  RD  Instruction Review Code  R- Review/reinforce      Pursed Lip Breathing:  -Group instruction that is supported by demonstration and informational handouts. Instructor discusses the  benefits of pursed lip and diaphragmatic breathing and detailed demonstration on how to preform both.     Oxygen Safety:  -Group instruction provided by  PowerPoint, verbal discussion, and written material to support subject matter. There is an overview of "What is Oxygen" and "Why do we need it".  Instructor also reviews how to create a safe environment for oxygen use, the importance of using oxygen as prescribed, and the risks of noncompliance. There is a brief discussion on traveling with oxygen and resources the patient may utilize.   PULMONARY REHAB OTHER RESPIRATORY from 12/02/2016 in [place]  Date  09/02/16  Educator  Truddie Crumble  Instruction Review Code  2- meets goals/outcomes      Oxygen Equipment:  -Group instruction provided by [employer] Staff utilizing handouts, written materials, and equipment demonstrations.   PULMONARY REHAB OTHER RESPIRATORY from 12/02/2016 in [place]  Date  10/21/16  Educator  [employer]  Instruction Review Code  2- meets goals/outcomes      Signs and Symptoms:  -Group instruction provided by written material and verbal discussion to support subject matter. Warning signs and symptoms of infection, stroke, and heart attack are reviewed and when to call the physician/911 reinforced. Tips for preventing the spread of infection discussed.   PULMONARY REHAB OTHER RESPIRATORY from 12/02/2016 in [place]  Date  09/30/16  Educator  RN  Instruction Review Code  2- meets goals/outcomes      Advanced Directives:  -Group instruction provided by verbal instruction and written material to support subject matter. Instructor reviews Advanced Directive laws and proper instruction for filling out document.   Pulmonary Video:  -Group video education that reviews the importance of medication and oxygen compliance, exercise, good nutrition, pulmonary hygiene, and pursed lip and diaphragmatic breathing for the pulmonary patient.   PULMONARY REHAB OTHER RESPIRATORY from 12/02/2016 in [place]   Date  09/09/16  Instruction Review Code  2- meets goals/outcomes      Exercise for the Pulmonary Patient:  -Group instruction that is supported by a PowerPoint presentation. Instructor discusses benefits of exercise, core components of exercise, frequency, duration, and intensity of an exercise routine, importance of utilizing pulse oximetry during exercise, safety while exercising, and options of places to exercise outside of rehab.     PULMONARY REHAB OTHER RESPIRATORY from 12/02/2016 in [place]  Date  11/25/16  Educator  ep  Instruction Review Code  R- Review/reinforce      Pulmonary Medications:  -Verbally interactive group education provided by instructor with focus on inhaled medications and proper administration.   PULMONARY REHAB OTHER RESPIRATORY from 12/02/2016 in [place]  Date  10/14/16  Educator  Pharm  Instruction Review Code  2- meets goals/outcomes      Anatomy and Physiology of the Respiratory System and Intimacy:  -Group instruction provided by PowerPoint, verbal discussion, and written material to support subject matter. Instructor reviews respiratory cycle and anatomical components of the respiratory system and their functions. Instructor also reviews differences in obstructive and restrictive respiratory diseases with examples of each. Intimacy, Sex, and Sexuality differences are reviewed with a discussion on how relationships can change when diagnosed with pulmonary disease. Common sexual concerns are reviewed.   PULMONARY REHAB OTHER RESPIRATORY from 12/02/2016 in [place]  Date  10/07/16  Educator  Rupal Childress  Instruction Review Code  2- meets goals/outcomes      MD DAY -A group question and answer session with a medical doctor that allows participants to ask questions that relate to their pulmonary disease state.   PULMONARY REHAB OTHER RESPIRATORY from 12/02/2016 in  [place]  Date  12/02/16  Educator  Nelda Marseille  Instruction Review Code  2- meets goals/outcomes      OTHER EDUCATION -Group or individual verbal, written, or video instructions that support the educational goals of the pulmonary rehab program.   Knowledge Questionnaire Score:   Core Components/Risk Factors/Patient Goals at Admission:     Personal Goals and Risk Factors at Admission - 07/23/16 1052      Core Components/Risk Factors/Patient Goals on Admission    Weight Management Yes;Weight Loss   Intervention Weight Management: Develop a combined nutrition and exercise program designed to reach desired caloric intake, while maintaining appropriate intake of nutrient and fiber, sodium and fats, and appropriate energy expenditure required for the weight goal.;Weight Management: Provide education and appropriate resources to help participant work on and attain dietary goals.   Expected Outcomes Short Term: Continue to assess and modify interventions until short term weight is achieved;Understanding of distribution of calorie intake throughout the day with the consumption of 4-5 meals/snacks;Understanding recommendations for meals to include 15-35% energy as protein, 25-35% energy from fat, 35-60% energy from carbohydrates, less than 200mg  of dietary cholesterol, 20-35 gm of total fiber daily;Weight Loss: Understanding of general recommendations for a balanced deficit meal plan, which promotes 1-2 lb weight loss per week and includes a negative energy balance of [phone] kcal/d   Increase Strength and Stamina Yes   Intervention Provide advice, education, support and counseling about physical activity/exercise needs.;Develop an individualized exercise prescription for aerobic and resistive training based on initial evaluation findings, risk stratification, comorbidities and participant's personal goals.   Expected Outcomes Achievement of increased cardiorespiratory  fitness and enhanced flexibility, muscular endurance and strength shown through measurements of functional capacity and personal statement of participant.   Improve shortness of breath with ADL's Yes   Intervention Provide education, individualized exercise plan and daily activity instruction to help decrease symptoms of SOB with activities of daily living.   Expected Outcomes Short Term: Achieves a reduction of symptoms when performing activities of daily living.      Core Components/Risk Factors/Patient Goals Review:      Goals and Risk Factor Review    Row Name 08/09/16 1158 09/07/16 0719 10/05/16 1417 11/30/16 1535       Core Components/Risk Factors/Patient Goals Review   Personal Goals Review Weight Management/Obesity;Improve shortness of breath with ADL's Weight Management/Obesity;Improve shortness of breath with ADL's Weight Management/Obesity;Improve shortness of breath with ADL's Weight Management/Obesity;Improve shortness of breath with ADL's    Review see "comments" section on ITP see "comments" section on ITP see "comments" section on ITP patient has not lost weight while in the program. She has began to eat "healthier" foods. She states her shortness of breath has improved and she was able to help a friend with yard work last weekend.    Expected Outcomes see admission expected outcomes see admission expected outcomes see admission expected outcomes see admission expected outcomes       Core Components/Risk Factors/Patient Goals at Discharge (Final Review):      Goals and Risk Factor Review - 11/30/16 1535      Core Components/Risk Factors/Patient Goals Review   Personal Goals Review Weight Management/Obesity;Improve shortness of breath with ADL's  Review patient has not lost weight while in the program. She has began to eat "healthier" foods. She states her shortness of breath has improved and she was able to help a friend with yard work last weekend.   Expected Outcomes  see admission expected outcomes      ITP Comments:   Comments: ITP REVIEW Pt is making expected progress toward pulmonary rehab goals after completing 29 sessions. Recommend continued exercise, life style modification, education, and utilization of breathing techniques to increase stamina and strength and decrease shortness of breath with exertion.

## 2016-12-02 NOTE — Progress Notes (Signed)
Daily Session Note  Patient Details  Name: Holly Bowen MRN: [id number] Date of Birth: [birth year] Referring Provider:     Pulmonary Rehab Walk Test from 07/29/2016 in [place]  Referring Provider  Dr. Nelda Marseille      Encounter Date: 12/02/2016  Check In:     Session Check In - 12/02/16 1017      Check-In   Location [facility]   Staff Present Rosebud Poles, RN, BSN;Ailyne Pawley, MS, ACSM RCEP, Exercise Physiologist;Lisa Ysidro Evert, RN;Portia Rollene Rotunda, RN, BSN   Supervising physician immediately available to respond to emergencies [facility] immediately available   Physician(s) Dr. Wendee Beavers   Medication changes reported     No   Fall or balance concerns reported    No   Tobacco Cessation No Change   Warm-up and Cool-down Performed as group-led instruction   Resistance Training Performed Yes   VAD Patient? No     Pain Assessment   Currently in Pain? No/denies      Capillary Blood Glucose: No results found for this or any previous visit (from the past 24 hour(s)).      Exercise Prescription Changes - 12/02/16 1200      Response to Exercise   Blood Pressure (Admit) 144/60   Blood Pressure (Exercise) 132/66   Blood Pressure (Exit) 124/80   Heart Rate (Admit) 57 bpm   Heart Rate (Exercise) 86 bpm   Heart Rate (Exit) 60 bpm   Oxygen Saturation (Admit) 98 %   Oxygen Saturation (Exercise) 96 %   Oxygen Saturation (Exit) 97 %   Rating of Perceived Exertion (Exercise) 11   Perceived Dyspnea (Exercise) 1   Duration Progress to 45 minutes of aerobic exercise without signs/symptoms of physical distress   Intensity THRR unchanged     Progression   Progression Continue to progress workloads to maintain intensity without signs/symptoms of physical distress.     Resistance Training   Training Prescription Yes   Weight orange bands   Reps 10-15   Time 10 Minutes     Interval Training   Interval Training No     NuStep   Level 7    Minutes 17   METs 3.2     Track   Laps 18   Minutes 17      History  Smoking Status  . Never Smoker  Smokeless Tobacco  . Never Used    Goals Met:  Exercise tolerated well No report of cardiac concerns or symptoms Strength training completed today  Goals Unmet:  Not Applicable  Comments: Service time is from 10:30a to 12:10p    Dr. Rush Farmer is Medical Director for Pulmonary Rehab at [facility]

## 2016-12-07 ENCOUNTER — Encounter (HOSPITAL_COMMUNITY): Payer: Worker's Compensation

## 2016-12-09 ENCOUNTER — Encounter (HOSPITAL_COMMUNITY): Payer: Worker's Compensation

## 2016-12-09 ENCOUNTER — Encounter (HOSPITAL_COMMUNITY)
Admission: RE | Admit: 2016-12-09 | Discharge: 2016-12-09 | Disposition: A | Discharge status: Home / Self Care | Payer: Worker's Compensation | Source: Ambulatory Visit | Attending: Internal Medicine | Admitting: Internal Medicine

## 2016-12-09 VITALS — Wt 168.0 lb

## 2016-12-09 DIAGNOSIS — R0602 Shortness of breath: Secondary | ICD-10-CM | POA: Diagnosis not present

## 2016-12-09 DIAGNOSIS — I2699 Other pulmonary embolism without acute cor pulmonale: Secondary | ICD-10-CM

## 2016-12-09 NOTE — Progress Notes (Signed)
Daily Session Note  Patient Details  Name: Arrington Bencomo MRN: [id number] Date of Birth: 17-Oct-1955 Referring Provider:     Pulmonary Rehab Walk Test from 07/29/2016 in [place]  Referring Provider  Dr. Nelda Marseille      Encounter Date: 12/09/2016  Check In:     Session Check In - 12/09/16 1027      Check-In   Location [facility]   Staff Present Rosebud Poles, RN, BSN;Lisa Ysidro Evert, RN;Desta Bujak Rollene Rotunda, RN, BSN   Supervising physician immediately available to respond to emergencies [facility] immediately available   Physician(s) Dr. Maryland Pink   Medication changes reported     No   Fall or balance concerns reported    No   Tobacco Cessation No Change   Warm-up and Cool-down Performed as group-led instruction   Resistance Training Performed Yes   VAD Patient? No     Pain Assessment   Currently in Pain? No/denies      Capillary Blood Glucose: No results found for this or any previous visit (from the past 24 hour(s)).      Exercise Prescription Changes - 12/09/16 1237      Response to Exercise   Blood Pressure (Admit) 110/64   Blood Pressure (Exercise) 134/72   Blood Pressure (Exit) 122/78   Heart Rate (Admit) 66 bpm   Heart Rate (Exercise) 93 bpm   Heart Rate (Exit) 67 bpm   Oxygen Saturation (Admit) 96 %   Oxygen Saturation (Exercise) 97 %   Oxygen Saturation (Exit) 97 %   Rating of Perceived Exertion (Exercise) 11   Perceived Dyspnea (Exercise) 1   Duration Progress to 45 minutes of aerobic exercise without signs/symptoms of physical distress   Intensity THRR unchanged     Progression   Progression Continue to progress workloads to maintain intensity without signs/symptoms of physical distress.     Resistance Training   Training Prescription Yes   Weight orange bands   Reps 10-15   Time 10 Minutes     Interval Training   Interval Training No     Bike   Level 4.5   Minutes 17     Track   Laps 17   Minutes  17      History  Smoking Status  . Never Smoker  Smokeless Tobacco  . Never Used    Goals Met:  Independence with exercise equipment Improved SOB with ADL's Using PLB without cueing & demonstrates good technique Exercise tolerated well No report of cardiac concerns or symptoms Strength training completed today  Goals Unmet:  Not Applicable  Comments: Service time is from 1030 to 1215   Dr. Rush Farmer is Medical Director for Pulmonary Rehab at [facility].

## 2016-12-14 ENCOUNTER — Encounter (HOSPITAL_COMMUNITY)
Admission: RE | Admit: 2016-12-14 | Discharge: 2016-12-14 | Disposition: A | Discharge status: Home / Self Care | Payer: Worker's Compensation | Source: Ambulatory Visit | Attending: Internal Medicine | Admitting: Internal Medicine

## 2016-12-14 VITALS — Wt 168.9 lb

## 2016-12-14 DIAGNOSIS — R0602 Shortness of breath: Secondary | ICD-10-CM | POA: Diagnosis not present

## 2016-12-14 DIAGNOSIS — I2699 Other pulmonary embolism without acute cor pulmonale: Secondary | ICD-10-CM

## 2016-12-14 NOTE — Progress Notes (Signed)
Daily Session Note  Patient Details  Name: Holly Bowen MRN: [id number] Date of Birth: [birth year] Referring Provider:     Pulmonary Rehab Walk Test from 07/29/2016 in [place]  Referring Provider  Dr. Nelda Marseille      Encounter Date: 12/14/2016  Check In:     Session Check In - 12/14/16 1018      Check-In   [facility]   Staff Present Trish Fountain, RN, Maxcine Ham, RN, BSN;Molly diVincenzo, MS, ACSM RCEP, Exercise Physiologist;Lisa Ysidro Evert, RN   Supervising physician immediately available to respond to emergencies [facility] immediately available   Physician(s) Dr. Maryland Pink   Medication changes reported     No   Fall or balance concerns reported    No   Tobacco Cessation No Change   Warm-up and Cool-down Performed as group-led instruction   Resistance Training Performed Yes   VAD Patient? No     Pain Assessment   Currently in Pain? No/denies   Multiple Pain Sites No      Capillary Blood Glucose: No results found for this or any previous visit (from the past 24 hour(s)).      Exercise Prescription Changes - 12/14/16 1200      Response to Exercise   Blood Pressure (Admit) 126/68   Blood Pressure (Exercise) 136/70   Blood Pressure (Exit) 118/68   Heart Rate (Admit) 63 bpm   Heart Rate (Exercise) 92 bpm   Heart Rate (Exit) 66 bpm   Oxygen Saturation (Admit) 96 %   Oxygen Saturation (Exercise) 97 %   Oxygen Saturation (Exit) 95 %   Rating of Perceived Exertion (Exercise) 11   Perceived Dyspnea (Exercise) 1   Duration Progress to 45 minutes of aerobic exercise without signs/symptoms of physical distress   Intensity THRR unchanged     Progression   Progression Continue to progress workloads to maintain intensity without signs/symptoms of physical distress.     Resistance Training   Training Prescription Yes   Weight orange bands   Reps 10-15   Time 10 Minutes     Interval Training   Interval  Training No     Bike   Level 4.5   Minutes 17     NuStep   Level 7   Minutes 17   METs 3.2     Track   Laps 19   Minutes 17      History  Smoking Status  . Never Smoker  Smokeless Tobacco  . Never Used    Goals Met:  Exercise tolerated well Strength training completed today  Goals Unmet:  Not Applicable  Comments: Service time is from 1030 to 1200    Dr. Rush Farmer is Medical Director for Pulmonary Rehab at [facility].

## 2016-12-16 ENCOUNTER — Encounter (HOSPITAL_COMMUNITY)
Admission: RE | Admit: 2016-12-16 | Discharge: 2016-12-16 | Disposition: A | Discharge status: Home / Self Care | Payer: Worker's Compensation | Source: Ambulatory Visit | Attending: Internal Medicine | Admitting: Internal Medicine

## 2016-12-16 ENCOUNTER — Encounter (HOSPITAL_COMMUNITY): Payer: Worker's Compensation

## 2016-12-16 VITALS — Wt 169.5 lb

## 2016-12-16 DIAGNOSIS — R0602 Shortness of breath: Secondary | ICD-10-CM | POA: Diagnosis not present

## 2016-12-16 DIAGNOSIS — I2699 Other pulmonary embolism without acute cor pulmonale: Secondary | ICD-10-CM

## 2016-12-16 NOTE — Progress Notes (Signed)
Daily Session Note  Patient Details  Name: Holly Bowen MRN: [id number] Date of Birth: 12/31/1955 Referring Provider:     Pulmonary Rehab Walk Test from 07/29/2016 in [place]  Referring Provider  Dr. Nelda Marseille      Encounter Date: 12/16/2016  Check In:     Session Check In - 12/16/16 1317      Check-In   Location [facility]      Capillary Blood Glucose: No results found for this or any previous visit (from the past 24 hour(s)).      Exercise Prescription Changes - 12/16/16 1300      Response to Exercise   Blood Pressure (Admit) 110/60   Blood Pressure (Exercise) 136/68   Blood Pressure (Exit) 132/74   Heart Rate (Admit) 66 bpm   Heart Rate (Exercise) 100 bpm   Heart Rate (Exit) 60 bpm   Oxygen Saturation (Admit) 96 %   Oxygen Saturation (Exercise) 95 %   Oxygen Saturation (Exit) 93 %   Rating of Perceived Exertion (Exercise) 11   Perceived Dyspnea (Exercise) 1   Duration Progress to 45 minutes of aerobic exercise without signs/symptoms of physical distress   Intensity THRR unchanged     Progression   Progression Continue to progress workloads to maintain intensity without signs/symptoms of physical distress.     Resistance Training   Training Prescription Yes   Weight orange bands   Reps 10-15   Time 10 Minutes     Interval Training   Interval Training No     Bike   Level 4.6   Minutes 17     NuStep   Level 7   Minutes 17      History  Smoking Status  . Never Smoker  Smokeless Tobacco  . Never Used    Goals Met:  Exercise tolerated well No report of cardiac concerns or symptoms Strength training completed today  Goals Unmet:  Not Applicable  Comments: Service time is from 10:30A to 12:30P    Dr. Rush Farmer is Medical Director for Pulmonary Rehab at [facility].

## 2016-12-21 ENCOUNTER — Encounter (HOSPITAL_COMMUNITY)
Admission: RE | Admit: 2016-12-21 | Discharge: 2016-12-21 | Disposition: A | Discharge status: Home / Self Care | Payer: Worker's Compensation | Source: Ambulatory Visit | Attending: Internal Medicine | Admitting: Internal Medicine

## 2016-12-21 DIAGNOSIS — R0602 Shortness of breath: Secondary | ICD-10-CM | POA: Diagnosis not present

## 2016-12-21 DIAGNOSIS — I2699 Other pulmonary embolism without acute cor pulmonale: Secondary | ICD-10-CM

## 2016-12-22 ENCOUNTER — Ambulatory Visit (HOSPITAL_COMMUNITY): Payer: Self-pay | Admitting: *Deleted

## 2016-12-23 ENCOUNTER — Encounter (HOSPITAL_COMMUNITY): Payer: Worker's Compensation

## 2016-12-23 ENCOUNTER — Encounter (HOSPITAL_COMMUNITY)
Admission: RE | Admit: 2016-12-23 | Payer: Worker's Compensation | Source: Ambulatory Visit | Attending: Internal Medicine | Admitting: Internal Medicine

## 2016-12-30 ENCOUNTER — Encounter (HOSPITAL_COMMUNITY)
Admission: RE | Admit: 2016-12-30 | Discharge: 2016-12-30 | Disposition: A | Discharge status: Home / Self Care | Payer: Worker's Compensation | Source: Ambulatory Visit | Attending: Pulmonary Disease | Admitting: Pulmonary Disease

## 2016-12-30 ENCOUNTER — Encounter (HOSPITAL_COMMUNITY): Payer: Worker's Compensation

## 2016-12-30 DIAGNOSIS — I10 Essential (primary) hypertension: Secondary | ICD-10-CM | POA: Insufficient documentation

## 2016-12-30 DIAGNOSIS — R0602 Shortness of breath: Secondary | ICD-10-CM | POA: Insufficient documentation

## 2016-12-30 NOTE — Progress Notes (Signed)
Holly Bowen started the Out patient Pulmonary Maintenance program today. She tolerated exercise well today without complaints.

## 2017-01-04 ENCOUNTER — Encounter (HOSPITAL_COMMUNITY)
Admission: RE | Admit: 2017-01-04 | Discharge: 2017-01-04 | Disposition: A | Discharge status: Home / Self Care | Payer: Worker's Compensation | Source: Ambulatory Visit | Attending: Pulmonary Disease | Admitting: Pulmonary Disease

## 2017-01-06 ENCOUNTER — Encounter (HOSPITAL_COMMUNITY): Payer: Worker's Compensation

## 2017-01-06 ENCOUNTER — Encounter (HOSPITAL_COMMUNITY)
Admission: RE | Admit: 2017-01-06 | Discharge: 2017-01-06 | Disposition: A | Discharge status: Home / Self Care | Payer: Worker's Compensation | Source: Ambulatory Visit | Attending: Pulmonary Disease | Admitting: Pulmonary Disease

## 2017-01-11 ENCOUNTER — Encounter (HOSPITAL_COMMUNITY): Payer: Worker's Compensation

## 2017-01-13 ENCOUNTER — Encounter (HOSPITAL_COMMUNITY): Payer: Worker's Compensation

## 2017-01-13 ENCOUNTER — Encounter (HOSPITAL_COMMUNITY)
Admission: RE | Admit: 2017-01-13 | Discharge: 2017-01-13 | Disposition: A | Discharge status: Home / Self Care | Payer: Worker's Compensation | Source: Ambulatory Visit | Attending: Internal Medicine | Admitting: Internal Medicine

## 2017-01-18 ENCOUNTER — Encounter (HOSPITAL_COMMUNITY)
Admission: RE | Admit: 2017-01-18 | Discharge: 2017-01-18 | Disposition: A | Discharge status: Home / Self Care | Payer: Worker's Compensation | Source: Ambulatory Visit | Attending: Internal Medicine | Admitting: Internal Medicine

## 2017-01-20 ENCOUNTER — Encounter (HOSPITAL_COMMUNITY): Payer: Worker's Compensation

## 2017-01-25 ENCOUNTER — Encounter (HOSPITAL_COMMUNITY)
Admission: RE | Admit: 2017-01-25 | Discharge: 2017-01-25 | Disposition: A | Discharge status: Home / Self Care | Payer: Worker's Compensation | Source: Ambulatory Visit | Attending: Internal Medicine | Admitting: Internal Medicine

## 2017-01-27 ENCOUNTER — Encounter (HOSPITAL_COMMUNITY)
Admission: RE | Admit: 2017-01-27 | Discharge: 2017-01-27 | Disposition: A | Discharge status: Home / Self Care | Payer: Worker's Compensation | Source: Ambulatory Visit | Attending: Internal Medicine | Admitting: Internal Medicine

## 2017-01-27 ENCOUNTER — Encounter (HOSPITAL_COMMUNITY): Payer: Worker's Compensation

## 2017-02-01 ENCOUNTER — Encounter (HOSPITAL_COMMUNITY)
Admission: RE | Admit: 2017-02-01 | Discharge: 2017-02-01 | Disposition: A | Discharge status: Home / Self Care | Payer: Self-pay | Source: Ambulatory Visit | Attending: Pulmonary Disease | Admitting: Pulmonary Disease

## 2017-02-01 DIAGNOSIS — I10 Essential (primary) hypertension: Secondary | ICD-10-CM | POA: Insufficient documentation

## 2017-02-01 DIAGNOSIS — R0602 Shortness of breath: Secondary | ICD-10-CM | POA: Insufficient documentation

## 2017-02-01 DIAGNOSIS — I2699 Other pulmonary embolism without acute cor pulmonale: Secondary | ICD-10-CM

## 2017-02-01 NOTE — Progress Notes (Signed)
Discharge Progress Report  Patient Details  Name: Holly Bowen MRN: [id number] Date of Birth: December 23, 1955 Referring Provider:     Pulmonary Rehab Walk Test from 07/29/2016 in [place]  Referring Provider  Dr. Nelda Marseille       Number of Visits: 33  Reason for Discharge:  Patient reached a stable level of exercise. Patient independent in their exercise.  Smoking History:  History  Smoking Status  . Never Smoker  Smokeless Tobacco  . Never Used    Diagnosis:  Other acute pulmonary embolism without acute cor pulmonale (HCC)  ADL UCSD:     Pulmonary Assessment Scores    Row Name 12/16/16 1401 12/23/16 0734       ADL UCSD   ADL Phase Exit Exit    SOB Score total 53  -      CAT Score   CAT Score 21  Exit  -      mMRC Score   mMRC Score  - 1       Initial Exercise Prescription:   Discharge Exercise Prescription (Final Exercise Prescription Changes):     Exercise Prescription Changes - 12/16/16 1300      Response to Exercise   Blood Pressure (Admit) 110/60   Blood Pressure (Exercise) 136/68   Blood Pressure (Exit) 132/74   Heart Rate (Admit) 66 bpm   Heart Rate (Exercise) 100 bpm   Heart Rate (Exit) 60 bpm   Oxygen Saturation (Admit) 96 %   Oxygen Saturation (Exercise) 95 %   Oxygen Saturation (Exit) 93 %   Rating of Perceived Exertion (Exercise) 11   Perceived Dyspnea (Exercise) 1   Duration Progress to 45 minutes of aerobic exercise without signs/symptoms of physical distress   Intensity THRR unchanged     Progression   Progression Continue to progress workloads to maintain intensity without signs/symptoms of physical distress.     Resistance Training   Training Prescription Yes   Weight orange bands   Reps 10-15   Time 10 Minutes     Interval Training   Interval Training No     Bike   Level 4.6   Minutes 17     NuStep   Level 7   Minutes 17      Functional Capacity:     6 Minute Walk    Row Name  12/23/16 0732         6 Minute Walk   Phase Discharge     Distance 1600 feet     Walk Time 6 minutes     # of Rest Breaks 0     MPH 3.03     METS 3.3     RPE 13     Perceived Dyspnea  2     Symptoms No     Resting HR 71 bpm     Resting BP 144/76     Max Ex. HR 104 bpm     Max Ex. BP 150/84       Interval HR   Baseline HR (retired) 71     1 Minute HR 88     2 Minute HR 101     3 Minute HR 103     4 Minute HR 104     6 Minute HR 93     2 Minute Post HR 78     Interval Heart Rate? Yes       Interval Oxygen   Baseline Oxygen Saturation % 97 %  Resting Liters of Oxygen 0 L     1 Minute Oxygen Saturation % 96 %     1 Minute Liters of Oxygen 0 L     2 Minute Oxygen Saturation % 95 %     2 Minute Liters of Oxygen 0 L     3 Minute Oxygen Saturation % 95 %     3 Minute Liters of Oxygen 0 L     4 Minute Oxygen Saturation % 94 %     4 Minute Liters of Oxygen 0 L     5 Minute Liters of Oxygen 0 L     6 Minute Oxygen Saturation % 97 %     6 Minute Liters of Oxygen 0 L     2 Minute Post Oxygen Saturation % 97 %     2 Minute Post Liters of Oxygen 0 L        Psychological, QOL, Others - Outcomes: PHQ 2/9: Depression screen [facility] 2/9 12/21/2016 07/23/2016 08/08/2014 03/08/2014  Decreased Interest 3 1 2 3   Down, Depressed, Hopeless 0 0 0 3  PHQ - 2 Score 3 1 2 6   Altered sleeping - - 0 3  Tired, decreased energy - - 3 3  Change in appetite - - 0 3  Feeling bad or failure about yourself  - - 0 0  Trouble concentrating - - 0 0  Moving slowly or fidgety/restless - - 0 1  Suicidal thoughts - - 0 0  PHQ-9 Score - - 5 16    Quality of Life:   Personal Goals: Goals established at orientation with interventions provided to work toward goal.    Personal Goals Discharge:     Goals and Risk Factor Review    Row Name 08/09/16 1158 09/07/16 0719 10/05/16 1417 11/30/16 1535 02/01/17 0741     Core Components/Risk Factors/Patient Goals Review   Personal Goals Review Weight  Management/Obesity;Improve shortness of breath with ADL's Weight Management/Obesity;Improve shortness of breath with ADL's Weight Management/Obesity;Improve shortness of breath with ADL's Weight Management/Obesity;Improve shortness of breath with ADL's  -   Review see "comments" section on ITP see "comments" section on ITP see "comments" section on ITP patient has not lost weight while in the program. She has began to eat "healthier" foods. She states her shortness of breath has improved and she was able to help a friend with yard work last weekend. weight loss goal unmet however patient states she is making healthier choices. her shortness of breath is improved   Expected Outcomes see admission expected outcomes see admission expected outcomes see admission expected outcomes see admission expected outcomes  -      Exercise Goals and Review:   Nutrition & Weight - Outcomes:     Pre Biometrics - 08/19/16 1554      Pre Biometrics   Height 5\' 6"  (1.676 m)         Post Biometrics - 12/23/16 0734       Post  Biometrics   Grip Strength 30 kg      Nutrition:     Nutrition Therapy & Goals - 08/17/16 1444      Nutrition Therapy   Diet Therapeutic Lifestyle Changes     Personal Nutrition Goals   Nutrition Goal Wt loss of 1-2 lb/week to a wt loss goal of 6-24 lb at graduation from [place], educate and counsel regarding individualized specific dietary modifications aiming towards targeted  core components such as weight, hypertension, lipid management, diabetes, heart failure and other comorbidities.   Expected Outcomes Short Term Goal: Understand basic principles of dietary content, such as calories, fat, sodium, cholesterol and nutrients.;Long Term Goal: Adherence to prescribed nutrition plan.      Nutrition Discharge:     Nutrition Assessments - 12/31/16 1427      Rate Your Plate Scores   Pre Score 53   Post Score 58       Education Questionnaire Score:     Knowledge Questionnaire Score - 12/16/16 1401      Knowledge Questionnaire Score   Post Score 12/13     She continues to work on her personal goals while enrolled in the pulmonary rehab maintenance program.

## 2017-02-03 ENCOUNTER — Encounter (HOSPITAL_COMMUNITY): Payer: Self-pay

## 2017-02-03 ENCOUNTER — Encounter (HOSPITAL_COMMUNITY): Payer: Worker's Compensation

## 2017-02-08 ENCOUNTER — Encounter (HOSPITAL_COMMUNITY): Payer: Self-pay

## 2017-02-10 ENCOUNTER — Encounter (HOSPITAL_COMMUNITY): Payer: Worker's Compensation

## 2017-02-10 ENCOUNTER — Encounter (HOSPITAL_COMMUNITY): Payer: Self-pay

## 2017-02-14 DIAGNOSIS — M79631 Pain in right forearm: Secondary | ICD-10-CM | POA: Diagnosis not present

## 2017-02-14 DIAGNOSIS — S59911A Unspecified injury of right forearm, initial encounter: Secondary | ICD-10-CM | POA: Diagnosis not present

## 2017-02-14 DIAGNOSIS — M79641 Pain in right hand: Secondary | ICD-10-CM | POA: Diagnosis not present

## 2017-02-14 DIAGNOSIS — W19XXXA Unspecified fall, initial encounter: Secondary | ICD-10-CM | POA: Diagnosis not present

## 2017-02-14 DIAGNOSIS — M7989 Other specified soft tissue disorders: Secondary | ICD-10-CM | POA: Diagnosis not present

## 2017-02-14 DIAGNOSIS — S6991XA Unspecified injury of right wrist, hand and finger(s), initial encounter: Secondary | ICD-10-CM | POA: Diagnosis not present

## 2017-02-15 ENCOUNTER — Encounter (HOSPITAL_COMMUNITY): Payer: Self-pay

## 2017-02-17 ENCOUNTER — Encounter (HOSPITAL_COMMUNITY): Payer: Worker's Compensation

## 2017-02-17 ENCOUNTER — Encounter (HOSPITAL_COMMUNITY): Payer: Self-pay

## 2017-02-22 ENCOUNTER — Encounter (HOSPITAL_COMMUNITY): Payer: Self-pay

## 2017-02-24 ENCOUNTER — Encounter (HOSPITAL_COMMUNITY): Payer: Worker's Compensation

## 2017-02-24 ENCOUNTER — Encounter (HOSPITAL_COMMUNITY)
Admission: RE | Admit: 2017-02-24 | Discharge: 2017-02-24 | Disposition: A | Discharge status: Home / Self Care | Payer: Self-pay | Source: Ambulatory Visit | Attending: Internal Medicine | Admitting: Internal Medicine

## 2017-03-01 ENCOUNTER — Encounter (HOSPITAL_COMMUNITY)
Admission: RE | Admit: 2017-03-01 | Discharge: 2017-03-01 | Disposition: A | Discharge status: Home / Self Care | Payer: Self-pay | Source: Ambulatory Visit | Attending: Pulmonary Disease | Admitting: Pulmonary Disease

## 2017-03-01 DIAGNOSIS — I10 Essential (primary) hypertension: Secondary | ICD-10-CM | POA: Insufficient documentation

## 2017-03-01 DIAGNOSIS — R0602 Shortness of breath: Secondary | ICD-10-CM | POA: Insufficient documentation

## 2017-03-03 ENCOUNTER — Encounter (HOSPITAL_COMMUNITY): Payer: Worker's Compensation

## 2017-03-03 ENCOUNTER — Encounter (HOSPITAL_COMMUNITY)
Admission: RE | Admit: 2017-03-03 | Discharge: 2017-03-03 | Disposition: A | Discharge status: Home / Self Care | Payer: Self-pay | Source: Ambulatory Visit | Attending: Pulmonary Disease | Admitting: Pulmonary Disease

## 2017-03-08 ENCOUNTER — Encounter (HOSPITAL_COMMUNITY)
Admission: RE | Admit: 2017-03-08 | Discharge: 2017-03-08 | Disposition: A | Discharge status: Home / Self Care | Payer: Self-pay | Source: Ambulatory Visit | Attending: Pulmonary Disease | Admitting: Pulmonary Disease

## 2017-03-10 ENCOUNTER — Encounter (HOSPITAL_COMMUNITY): Payer: Worker's Compensation

## 2017-03-10 ENCOUNTER — Encounter (HOSPITAL_COMMUNITY): Payer: Self-pay

## 2017-03-15 ENCOUNTER — Encounter (HOSPITAL_COMMUNITY)
Admission: RE | Admit: 2017-03-15 | Discharge: 2017-03-15 | Disposition: A | Discharge status: Home / Self Care | Payer: Self-pay | Source: Ambulatory Visit | Attending: Pulmonary Disease | Admitting: Pulmonary Disease

## 2017-03-17 ENCOUNTER — Encounter (HOSPITAL_COMMUNITY)
Admission: RE | Admit: 2017-03-17 | Discharge: 2017-03-17 | Disposition: A | Discharge status: Home / Self Care | Payer: Self-pay | Source: Ambulatory Visit | Attending: Pulmonary Disease | Admitting: Pulmonary Disease

## 2017-03-17 ENCOUNTER — Encounter (HOSPITAL_COMMUNITY): Payer: Worker's Compensation

## 2017-03-22 ENCOUNTER — Encounter (HOSPITAL_COMMUNITY)
Admission: RE | Admit: 2017-03-22 | Discharge: 2017-03-22 | Disposition: A | Discharge status: Home / Self Care | Payer: Self-pay | Source: Ambulatory Visit | Attending: Pulmonary Disease | Admitting: Pulmonary Disease

## 2017-03-24 ENCOUNTER — Encounter (HOSPITAL_COMMUNITY)
Admission: RE | Admit: 2017-03-24 | Discharge: 2017-03-24 | Disposition: A | Discharge status: Home / Self Care | Payer: Self-pay | Source: Ambulatory Visit | Attending: Pulmonary Disease | Admitting: Pulmonary Disease

## 2017-03-24 ENCOUNTER — Encounter (HOSPITAL_COMMUNITY): Payer: Worker's Compensation

## 2017-03-29 ENCOUNTER — Encounter (HOSPITAL_COMMUNITY)
Admission: RE | Admit: 2017-03-29 | Discharge: 2017-03-29 | Disposition: A | Discharge status: Home / Self Care | Payer: Self-pay | Source: Ambulatory Visit | Attending: Pulmonary Disease | Admitting: Pulmonary Disease

## 2017-03-31 ENCOUNTER — Encounter (HOSPITAL_COMMUNITY)
Admission: RE | Admit: 2017-03-31 | Discharge: 2017-03-31 | Disposition: A | Discharge status: Home / Self Care | Payer: Self-pay | Source: Ambulatory Visit | Attending: Pulmonary Disease | Admitting: Pulmonary Disease

## 2017-03-31 DIAGNOSIS — I10 Essential (primary) hypertension: Secondary | ICD-10-CM | POA: Insufficient documentation

## 2017-03-31 DIAGNOSIS — R0602 Shortness of breath: Secondary | ICD-10-CM | POA: Insufficient documentation

## 2017-04-05 ENCOUNTER — Encounter (HOSPITAL_COMMUNITY): Payer: Self-pay

## 2017-04-07 ENCOUNTER — Encounter (HOSPITAL_COMMUNITY)
Admission: RE | Admit: 2017-04-07 | Discharge: 2017-04-07 | Disposition: A | Discharge status: Home / Self Care | Payer: Self-pay | Source: Ambulatory Visit | Attending: Pulmonary Disease | Admitting: Pulmonary Disease

## 2017-04-12 ENCOUNTER — Encounter (HOSPITAL_COMMUNITY)
Admission: RE | Admit: 2017-04-12 | Discharge: 2017-04-12 | Disposition: A | Discharge status: Home / Self Care | Payer: Self-pay | Source: Ambulatory Visit | Attending: Pulmonary Disease | Admitting: Pulmonary Disease

## 2017-04-14 ENCOUNTER — Encounter (HOSPITAL_COMMUNITY)
Admission: RE | Admit: 2017-04-14 | Discharge: 2017-04-14 | Disposition: A | Discharge status: Home / Self Care | Payer: Self-pay | Source: Ambulatory Visit | Attending: Family Medicine | Admitting: Family Medicine

## 2017-04-19 ENCOUNTER — Encounter (HOSPITAL_COMMUNITY)
Admission: RE | Admit: 2017-04-19 | Discharge: 2017-04-19 | Disposition: A | Discharge status: Home / Self Care | Payer: Self-pay | Source: Ambulatory Visit | Attending: Pulmonary Disease | Admitting: Pulmonary Disease

## 2017-04-25 DIAGNOSIS — G8929 Other chronic pain: Secondary | ICD-10-CM | POA: Diagnosis not present

## 2017-04-25 DIAGNOSIS — I2699 Other pulmonary embolism without acute cor pulmonale: Secondary | ICD-10-CM | POA: Diagnosis not present

## 2017-04-25 DIAGNOSIS — M545 Low back pain: Secondary | ICD-10-CM | POA: Diagnosis not present

## 2017-04-25 DIAGNOSIS — Z13220 Encounter for screening for lipoid disorders: Secondary | ICD-10-CM | POA: Diagnosis not present

## 2017-04-25 DIAGNOSIS — I10 Essential (primary) hypertension: Secondary | ICD-10-CM | POA: Diagnosis not present

## 2017-04-25 DIAGNOSIS — F419 Anxiety disorder, unspecified: Secondary | ICD-10-CM | POA: Diagnosis not present

## 2017-04-25 DIAGNOSIS — Z23 Encounter for immunization: Secondary | ICD-10-CM | POA: Diagnosis not present

## 2017-04-26 ENCOUNTER — Encounter (HOSPITAL_COMMUNITY)
Admission: RE | Admit: 2017-04-26 | Discharge: 2017-04-26 | Disposition: A | Discharge status: Home / Self Care | Payer: Self-pay | Source: Ambulatory Visit | Attending: Pulmonary Disease | Admitting: Pulmonary Disease

## 2017-04-27 DIAGNOSIS — Z13220 Encounter for screening for lipoid disorders: Secondary | ICD-10-CM | POA: Diagnosis not present

## 2017-04-27 DIAGNOSIS — I10 Essential (primary) hypertension: Secondary | ICD-10-CM | POA: Diagnosis not present

## 2017-04-28 ENCOUNTER — Encounter (HOSPITAL_COMMUNITY): Payer: Self-pay

## 2017-05-03 ENCOUNTER — Encounter (HOSPITAL_COMMUNITY)
Admission: RE | Admit: 2017-05-03 | Discharge: 2017-05-03 | Disposition: A | Discharge status: Home / Self Care | Payer: Worker's Compensation | Source: Ambulatory Visit | Attending: Pulmonary Disease | Admitting: Pulmonary Disease

## 2017-05-03 DIAGNOSIS — I10 Essential (primary) hypertension: Secondary | ICD-10-CM | POA: Diagnosis not present

## 2017-05-03 DIAGNOSIS — R0602 Shortness of breath: Secondary | ICD-10-CM | POA: Insufficient documentation

## 2017-05-05 ENCOUNTER — Encounter (HOSPITAL_COMMUNITY)
Admission: RE | Admit: 2017-05-05 | Discharge: 2017-05-05 | Disposition: A | Discharge status: Home / Self Care | Payer: Worker's Compensation | Source: Ambulatory Visit | Attending: Pulmonary Disease | Admitting: Pulmonary Disease

## 2017-05-10 ENCOUNTER — Encounter (HOSPITAL_COMMUNITY): Payer: Worker's Compensation

## 2017-05-12 ENCOUNTER — Encounter (HOSPITAL_COMMUNITY)
Admission: RE | Admit: 2017-05-12 | Discharge: 2017-05-12 | Disposition: A | Discharge status: Home / Self Care | Payer: Worker's Compensation | Source: Ambulatory Visit | Attending: Pulmonary Disease | Admitting: Pulmonary Disease

## 2017-05-17 ENCOUNTER — Encounter (HOSPITAL_COMMUNITY)
Admission: RE | Admit: 2017-05-17 | Discharge: 2017-05-17 | Disposition: A | Discharge status: Home / Self Care | Payer: Worker's Compensation | Source: Ambulatory Visit | Attending: Pulmonary Disease | Admitting: Pulmonary Disease

## 2017-05-19 ENCOUNTER — Encounter (HOSPITAL_COMMUNITY)
Admission: RE | Admit: 2017-05-19 | Discharge: 2017-05-19 | Disposition: A | Discharge status: Home / Self Care | Payer: Worker's Compensation | Source: Ambulatory Visit | Attending: Pulmonary Disease | Admitting: Pulmonary Disease

## 2017-05-26 ENCOUNTER — Encounter (HOSPITAL_COMMUNITY)
Admission: RE | Admit: 2017-05-26 | Discharge: 2017-05-26 | Disposition: A | Discharge status: Home / Self Care | Payer: Worker's Compensation | Source: Ambulatory Visit | Attending: Pulmonary Disease | Admitting: Pulmonary Disease

## 2017-06-02 ENCOUNTER — Encounter (HOSPITAL_COMMUNITY)
Admission: RE | Admit: 2017-06-02 | Discharge: 2017-06-02 | Disposition: A | Discharge status: Home / Self Care | Payer: Self-pay | Source: Ambulatory Visit | Attending: Pulmonary Disease | Admitting: Pulmonary Disease

## 2017-06-02 DIAGNOSIS — I10 Essential (primary) hypertension: Secondary | ICD-10-CM | POA: Insufficient documentation

## 2017-06-02 DIAGNOSIS — R0602 Shortness of breath: Secondary | ICD-10-CM | POA: Insufficient documentation

## 2017-06-07 ENCOUNTER — Encounter (HOSPITAL_COMMUNITY)
Admission: RE | Admit: 2017-06-07 | Discharge: 2017-06-07 | Disposition: A | Discharge status: Home / Self Care | Payer: Self-pay | Source: Ambulatory Visit | Attending: Pulmonary Disease | Admitting: Pulmonary Disease

## 2017-06-09 ENCOUNTER — Encounter (HOSPITAL_COMMUNITY)
Admission: RE | Admit: 2017-06-09 | Discharge: 2017-06-09 | Disposition: A | Discharge status: Home / Self Care | Payer: Self-pay | Source: Ambulatory Visit | Attending: Pulmonary Disease | Admitting: Pulmonary Disease

## 2017-06-14 ENCOUNTER — Encounter (HOSPITAL_COMMUNITY)
Admission: RE | Admit: 2017-06-14 | Discharge: 2017-06-14 | Disposition: A | Discharge status: Home / Self Care | Payer: Self-pay | Source: Ambulatory Visit | Attending: Pulmonary Disease | Admitting: Pulmonary Disease

## 2017-06-16 ENCOUNTER — Encounter (HOSPITAL_COMMUNITY)
Admission: RE | Admit: 2017-06-16 | Discharge: 2017-06-16 | Disposition: A | Discharge status: Home / Self Care | Payer: Self-pay | Source: Ambulatory Visit | Attending: Pulmonary Disease | Admitting: Pulmonary Disease

## 2017-06-21 ENCOUNTER — Encounter (HOSPITAL_COMMUNITY)
Admission: RE | Admit: 2017-06-21 | Discharge: 2017-06-21 | Disposition: A | Discharge status: Home / Self Care | Payer: Self-pay | Source: Ambulatory Visit | Attending: Pulmonary Disease | Admitting: Pulmonary Disease

## 2017-06-23 ENCOUNTER — Encounter (HOSPITAL_COMMUNITY): Payer: Self-pay

## 2017-06-28 ENCOUNTER — Encounter (HOSPITAL_COMMUNITY)
Admission: RE | Admit: 2017-06-28 | Discharge: 2017-06-28 | Disposition: A | Discharge status: Home / Self Care | Payer: Self-pay | Source: Ambulatory Visit | Attending: Pulmonary Disease | Admitting: Pulmonary Disease

## 2017-06-30 ENCOUNTER — Encounter (HOSPITAL_COMMUNITY)
Admission: RE | Admit: 2017-06-30 | Discharge: 2017-06-30 | Disposition: A | Discharge status: Home / Self Care | Payer: Self-pay | Source: Ambulatory Visit | Attending: Pulmonary Disease | Admitting: Pulmonary Disease

## 2017-07-05 ENCOUNTER — Encounter (HOSPITAL_COMMUNITY)
Admission: RE | Admit: 2017-07-05 | Discharge: 2017-07-05 | Disposition: A | Discharge status: Home / Self Care | Payer: Self-pay | Source: Ambulatory Visit | Attending: Pulmonary Disease | Admitting: Pulmonary Disease

## 2017-07-05 DIAGNOSIS — I10 Essential (primary) hypertension: Secondary | ICD-10-CM | POA: Insufficient documentation

## 2017-07-05 DIAGNOSIS — R0602 Shortness of breath: Secondary | ICD-10-CM | POA: Insufficient documentation

## 2017-07-07 ENCOUNTER — Encounter (HOSPITAL_COMMUNITY)
Admission: RE | Admit: 2017-07-07 | Discharge: 2017-07-07 | Disposition: A | Discharge status: Home / Self Care | Payer: Self-pay | Source: Ambulatory Visit | Attending: Pulmonary Disease | Admitting: Pulmonary Disease

## 2017-07-12 ENCOUNTER — Encounter (HOSPITAL_COMMUNITY)
Admission: RE | Admit: 2017-07-12 | Discharge: 2017-07-12 | Disposition: A | Discharge status: Home / Self Care | Payer: Self-pay | Source: Ambulatory Visit | Attending: Pulmonary Disease | Admitting: Pulmonary Disease

## 2017-07-14 ENCOUNTER — Encounter (HOSPITAL_COMMUNITY)
Admission: RE | Admit: 2017-07-14 | Discharge: 2017-07-14 | Disposition: A | Discharge status: Home / Self Care | Payer: Self-pay | Source: Ambulatory Visit | Attending: Pulmonary Disease | Admitting: Pulmonary Disease

## 2017-07-19 ENCOUNTER — Encounter (HOSPITAL_COMMUNITY)
Admission: RE | Admit: 2017-07-19 | Discharge: 2017-07-19 | Disposition: A | Discharge status: Home / Self Care | Payer: Self-pay | Source: Ambulatory Visit | Attending: Pulmonary Disease | Admitting: Pulmonary Disease

## 2017-07-21 ENCOUNTER — Encounter (HOSPITAL_COMMUNITY)
Admission: RE | Admit: 2017-07-21 | Discharge: 2017-07-21 | Disposition: A | Discharge status: Home / Self Care | Payer: Self-pay | Source: Ambulatory Visit | Attending: Pulmonary Disease | Admitting: Pulmonary Disease

## 2017-07-26 ENCOUNTER — Encounter (HOSPITAL_COMMUNITY)
Admission: RE | Admit: 2017-07-26 | Discharge: 2017-07-26 | Disposition: A | Discharge status: Home / Self Care | Payer: Self-pay | Source: Ambulatory Visit | Attending: Pulmonary Disease | Admitting: Pulmonary Disease

## 2017-07-28 ENCOUNTER — Encounter (HOSPITAL_COMMUNITY)
Admission: RE | Admit: 2017-07-28 | Discharge: 2017-07-28 | Disposition: A | Discharge status: Home / Self Care | Payer: Self-pay | Source: Ambulatory Visit | Attending: Pulmonary Disease | Admitting: Pulmonary Disease

## 2017-08-02 ENCOUNTER — Encounter (HOSPITAL_COMMUNITY)
Admission: RE | Admit: 2017-08-02 | Discharge: 2017-08-02 | Disposition: A | Discharge status: Home / Self Care | Payer: Self-pay | Source: Ambulatory Visit | Attending: Pulmonary Disease | Admitting: Pulmonary Disease

## 2017-08-02 DIAGNOSIS — R0602 Shortness of breath: Secondary | ICD-10-CM | POA: Insufficient documentation

## 2017-08-02 DIAGNOSIS — I10 Essential (primary) hypertension: Secondary | ICD-10-CM | POA: Insufficient documentation

## 2017-08-04 ENCOUNTER — Encounter (HOSPITAL_COMMUNITY)
Admission: RE | Admit: 2017-08-04 | Discharge: 2017-08-04 | Disposition: A | Discharge status: Home / Self Care | Payer: Self-pay | Source: Ambulatory Visit | Attending: Pulmonary Disease | Admitting: Pulmonary Disease

## 2017-08-09 ENCOUNTER — Encounter (HOSPITAL_COMMUNITY)
Admission: RE | Admit: 2017-08-09 | Discharge: 2017-08-09 | Disposition: A | Discharge status: Home / Self Care | Payer: Self-pay | Source: Ambulatory Visit | Attending: Pulmonary Disease | Admitting: Pulmonary Disease

## 2017-08-11 ENCOUNTER — Encounter (HOSPITAL_COMMUNITY)
Admission: RE | Admit: 2017-08-11 | Discharge: 2017-08-11 | Disposition: A | Discharge status: Home / Self Care | Payer: Self-pay | Source: Ambulatory Visit | Attending: Pulmonary Disease | Admitting: Pulmonary Disease

## 2017-08-16 ENCOUNTER — Encounter (HOSPITAL_COMMUNITY)
Admission: RE | Admit: 2017-08-16 | Discharge: 2017-08-16 | Disposition: A | Discharge status: Home / Self Care | Payer: Self-pay | Source: Ambulatory Visit | Attending: Pulmonary Disease | Admitting: Pulmonary Disease

## 2017-08-23 ENCOUNTER — Encounter (HOSPITAL_COMMUNITY)
Admission: RE | Admit: 2017-08-23 | Discharge: 2017-08-23 | Disposition: A | Discharge status: Home / Self Care | Payer: Self-pay | Source: Ambulatory Visit | Attending: Pulmonary Disease | Admitting: Pulmonary Disease

## 2017-08-25 ENCOUNTER — Encounter (HOSPITAL_COMMUNITY)
Admission: RE | Admit: 2017-08-25 | Discharge: 2017-08-25 | Disposition: A | Discharge status: Home / Self Care | Payer: Self-pay | Source: Ambulatory Visit | Attending: Pulmonary Disease | Admitting: Pulmonary Disease

## 2017-08-30 ENCOUNTER — Encounter (HOSPITAL_COMMUNITY)
Admission: RE | Admit: 2017-08-30 | Discharge: 2017-08-30 | Disposition: A | Discharge status: Home / Self Care | Payer: Self-pay | Source: Ambulatory Visit | Attending: Pulmonary Disease | Admitting: Pulmonary Disease

## 2017-08-30 DIAGNOSIS — I10 Essential (primary) hypertension: Secondary | ICD-10-CM | POA: Insufficient documentation

## 2017-08-30 DIAGNOSIS — R0602 Shortness of breath: Secondary | ICD-10-CM | POA: Insufficient documentation

## 2017-09-01 ENCOUNTER — Encounter (HOSPITAL_COMMUNITY)
Admission: RE | Admit: 2017-09-01 | Discharge: 2017-09-01 | Disposition: A | Discharge status: Home / Self Care | Payer: Self-pay | Source: Ambulatory Visit | Attending: Pulmonary Disease | Admitting: Pulmonary Disease

## 2017-09-06 ENCOUNTER — Encounter (HOSPITAL_COMMUNITY)
Admission: RE | Admit: 2017-09-06 | Discharge: 2017-09-06 | Disposition: A | Discharge status: Home / Self Care | Payer: Self-pay | Source: Ambulatory Visit | Attending: Pulmonary Disease | Admitting: Pulmonary Disease

## 2017-09-08 ENCOUNTER — Encounter (HOSPITAL_COMMUNITY): Payer: Self-pay

## 2017-09-13 ENCOUNTER — Encounter (HOSPITAL_COMMUNITY)
Admission: RE | Admit: 2017-09-13 | Discharge: 2017-09-13 | Disposition: A | Discharge status: Home / Self Care | Payer: Self-pay | Source: Ambulatory Visit | Attending: Pulmonary Disease | Admitting: Pulmonary Disease

## 2017-09-15 ENCOUNTER — Encounter (HOSPITAL_COMMUNITY)
Admission: RE | Admit: 2017-09-15 | Discharge: 2017-09-15 | Disposition: A | Discharge status: Home / Self Care | Payer: Self-pay | Source: Ambulatory Visit | Attending: Pulmonary Disease | Admitting: Pulmonary Disease

## 2017-09-20 ENCOUNTER — Encounter (HOSPITAL_COMMUNITY)
Admission: RE | Admit: 2017-09-20 | Discharge: 2017-09-20 | Disposition: A | Discharge status: Home / Self Care | Payer: Self-pay | Source: Ambulatory Visit | Attending: Family Medicine | Admitting: Family Medicine

## 2017-09-22 ENCOUNTER — Encounter (HOSPITAL_COMMUNITY)
Admission: RE | Admit: 2017-09-22 | Discharge: 2017-09-22 | Disposition: A | Discharge status: Home / Self Care | Payer: Self-pay | Source: Ambulatory Visit | Attending: Family Medicine | Admitting: Family Medicine

## 2017-09-27 ENCOUNTER — Encounter (HOSPITAL_COMMUNITY)
Admission: RE | Admit: 2017-09-27 | Discharge: 2017-09-27 | Disposition: A | Discharge status: Home / Self Care | Payer: Self-pay | Source: Ambulatory Visit | Attending: Pulmonary Disease | Admitting: Pulmonary Disease

## 2017-09-29 ENCOUNTER — Encounter (HOSPITAL_COMMUNITY)
Admission: RE | Admit: 2017-09-29 | Discharge: 2017-09-29 | Disposition: A | Discharge status: Home / Self Care | Payer: Self-pay | Source: Ambulatory Visit | Attending: Pulmonary Disease | Admitting: Pulmonary Disease

## 2017-09-29 DIAGNOSIS — R0602 Shortness of breath: Secondary | ICD-10-CM | POA: Insufficient documentation

## 2017-09-29 DIAGNOSIS — I10 Essential (primary) hypertension: Secondary | ICD-10-CM | POA: Insufficient documentation

## 2017-10-04 ENCOUNTER — Encounter (HOSPITAL_COMMUNITY)
Admission: RE | Admit: 2017-10-04 | Discharge: 2017-10-04 | Disposition: A | Discharge status: Home / Self Care | Payer: Self-pay | Source: Ambulatory Visit | Attending: Pulmonary Disease | Admitting: Pulmonary Disease

## 2017-10-06 ENCOUNTER — Encounter (HOSPITAL_COMMUNITY)
Admission: RE | Admit: 2017-10-06 | Discharge: 2017-10-06 | Disposition: A | Discharge status: Home / Self Care | Payer: Self-pay | Source: Ambulatory Visit | Attending: Pulmonary Disease | Admitting: Pulmonary Disease

## 2017-10-11 ENCOUNTER — Encounter (HOSPITAL_COMMUNITY)
Admission: RE | Admit: 2017-10-11 | Discharge: 2017-10-11 | Disposition: A | Discharge status: Home / Self Care | Payer: Self-pay | Source: Ambulatory Visit | Attending: Pulmonary Disease | Admitting: Pulmonary Disease

## 2017-10-13 ENCOUNTER — Encounter (HOSPITAL_COMMUNITY)
Admission: RE | Admit: 2017-10-13 | Discharge: 2017-10-13 | Disposition: A | Discharge status: Home / Self Care | Payer: Self-pay | Source: Ambulatory Visit | Attending: Pulmonary Disease | Admitting: Pulmonary Disease

## 2017-10-18 ENCOUNTER — Encounter (HOSPITAL_COMMUNITY)
Admission: RE | Admit: 2017-10-18 | Discharge: 2017-10-18 | Disposition: A | Discharge status: Home / Self Care | Payer: Self-pay | Source: Ambulatory Visit | Attending: Pulmonary Disease | Admitting: Pulmonary Disease

## 2017-10-20 ENCOUNTER — Encounter (HOSPITAL_COMMUNITY)
Admission: RE | Admit: 2017-10-20 | Discharge: 2017-10-20 | Disposition: A | Discharge status: Home / Self Care | Payer: Self-pay | Source: Ambulatory Visit | Attending: Pulmonary Disease | Admitting: Pulmonary Disease

## 2017-10-25 ENCOUNTER — Encounter (HOSPITAL_COMMUNITY)
Admission: RE | Admit: 2017-10-25 | Discharge: 2017-10-25 | Disposition: A | Discharge status: Home / Self Care | Payer: Self-pay | Source: Ambulatory Visit | Attending: Pulmonary Disease | Admitting: Pulmonary Disease

## 2017-10-27 ENCOUNTER — Encounter (HOSPITAL_COMMUNITY)
Admission: RE | Admit: 2017-10-27 | Discharge: 2017-10-27 | Disposition: A | Discharge status: Home / Self Care | Payer: Self-pay | Source: Ambulatory Visit | Attending: Pulmonary Disease | Admitting: Pulmonary Disease

## 2017-11-01 ENCOUNTER — Encounter (HOSPITAL_COMMUNITY)
Admission: RE | Admit: 2017-11-01 | Discharge: 2017-11-01 | Disposition: A | Discharge status: Home / Self Care | Payer: Self-pay | Source: Ambulatory Visit | Attending: Pulmonary Disease | Admitting: Pulmonary Disease

## 2017-11-01 DIAGNOSIS — R0602 Shortness of breath: Secondary | ICD-10-CM | POA: Insufficient documentation

## 2017-11-01 DIAGNOSIS — I10 Essential (primary) hypertension: Secondary | ICD-10-CM | POA: Insufficient documentation

## 2017-11-03 ENCOUNTER — Encounter (HOSPITAL_COMMUNITY)
Admission: RE | Admit: 2017-11-03 | Discharge: 2017-11-03 | Disposition: A | Discharge status: Home / Self Care | Payer: Self-pay | Source: Ambulatory Visit | Attending: Pulmonary Disease | Admitting: Pulmonary Disease

## 2017-11-08 ENCOUNTER — Encounter (HOSPITAL_COMMUNITY)
Admission: RE | Admit: 2017-11-08 | Discharge: 2017-11-08 | Disposition: A | Discharge status: Home / Self Care | Payer: Self-pay | Source: Ambulatory Visit | Attending: Pulmonary Disease | Admitting: Pulmonary Disease

## 2017-11-10 ENCOUNTER — Encounter (HOSPITAL_COMMUNITY)
Admission: RE | Admit: 2017-11-10 | Discharge: 2017-11-10 | Disposition: A | Discharge status: Home / Self Care | Payer: Self-pay | Source: Ambulatory Visit | Attending: Pulmonary Disease | Admitting: Pulmonary Disease

## 2017-11-15 ENCOUNTER — Encounter (HOSPITAL_COMMUNITY)
Admission: RE | Admit: 2017-11-15 | Discharge: 2017-11-15 | Disposition: A | Discharge status: Home / Self Care | Payer: Self-pay | Source: Ambulatory Visit | Attending: Pulmonary Disease | Admitting: Pulmonary Disease

## 2017-11-17 ENCOUNTER — Encounter (HOSPITAL_COMMUNITY)
Admission: RE | Admit: 2017-11-17 | Discharge: 2017-11-17 | Disposition: A | Discharge status: Home / Self Care | Payer: Self-pay | Source: Ambulatory Visit | Attending: Pulmonary Disease | Admitting: Pulmonary Disease

## 2017-11-21 DIAGNOSIS — M545 Low back pain: Secondary | ICD-10-CM | POA: Diagnosis not present

## 2017-11-21 DIAGNOSIS — Z1231 Encounter for screening mammogram for malignant neoplasm of breast: Secondary | ICD-10-CM | POA: Diagnosis not present

## 2017-11-21 DIAGNOSIS — I10 Essential (primary) hypertension: Secondary | ICD-10-CM | POA: Diagnosis not present

## 2017-11-21 DIAGNOSIS — Z0000 Encounter for general adult medical examination without abnormal findings: Secondary | ICD-10-CM | POA: Diagnosis not present

## 2017-11-21 DIAGNOSIS — R7309 Other abnormal glucose: Secondary | ICD-10-CM | POA: Diagnosis not present

## 2017-11-21 DIAGNOSIS — G8929 Other chronic pain: Secondary | ICD-10-CM | POA: Diagnosis not present

## 2017-11-21 DIAGNOSIS — Z1211 Encounter for screening for malignant neoplasm of colon: Secondary | ICD-10-CM | POA: Diagnosis not present

## 2017-11-21 DIAGNOSIS — F419 Anxiety disorder, unspecified: Secondary | ICD-10-CM | POA: Diagnosis not present

## 2017-11-21 DIAGNOSIS — I2699 Other pulmonary embolism without acute cor pulmonale: Secondary | ICD-10-CM | POA: Diagnosis not present

## 2017-11-22 ENCOUNTER — Encounter (HOSPITAL_COMMUNITY)
Admission: RE | Admit: 2017-11-22 | Discharge: 2017-11-22 | Disposition: A | Discharge status: Home / Self Care | Payer: Self-pay | Source: Ambulatory Visit | Attending: Pulmonary Disease | Admitting: Pulmonary Disease

## 2017-11-23 ENCOUNTER — Other Ambulatory Visit: Payer: Self-pay | Admitting: Family Medicine

## 2017-11-23 DIAGNOSIS — Z1239 Encounter for other screening for malignant neoplasm of breast: Secondary | ICD-10-CM

## 2017-11-24 ENCOUNTER — Encounter (HOSPITAL_COMMUNITY)
Admission: RE | Admit: 2017-11-24 | Discharge: 2017-11-24 | Disposition: A | Discharge status: Home / Self Care | Payer: Self-pay | Source: Ambulatory Visit | Attending: Pulmonary Disease | Admitting: Pulmonary Disease

## 2017-11-29 ENCOUNTER — Encounter (HOSPITAL_COMMUNITY)
Admission: RE | Admit: 2017-11-29 | Discharge: 2017-11-29 | Disposition: A | Discharge status: Home / Self Care | Payer: Self-pay | Source: Ambulatory Visit | Attending: Pulmonary Disease | Admitting: Pulmonary Disease

## 2017-11-29 DIAGNOSIS — R0602 Shortness of breath: Secondary | ICD-10-CM | POA: Insufficient documentation

## 2017-11-29 DIAGNOSIS — I10 Essential (primary) hypertension: Secondary | ICD-10-CM | POA: Insufficient documentation

## 2017-12-06 ENCOUNTER — Encounter (HOSPITAL_COMMUNITY)
Admission: RE | Admit: 2017-12-06 | Discharge: 2017-12-06 | Disposition: A | Discharge status: Home / Self Care | Payer: Self-pay | Source: Ambulatory Visit | Attending: Pulmonary Disease | Admitting: Pulmonary Disease

## 2017-12-08 ENCOUNTER — Encounter (HOSPITAL_COMMUNITY): Payer: Self-pay

## 2017-12-13 ENCOUNTER — Encounter (HOSPITAL_COMMUNITY)
Admission: RE | Admit: 2017-12-13 | Discharge: 2017-12-13 | Disposition: A | Discharge status: Home / Self Care | Payer: Self-pay | Source: Ambulatory Visit | Attending: Pulmonary Disease | Admitting: Pulmonary Disease

## 2017-12-14 ENCOUNTER — Encounter (HOSPITAL_COMMUNITY): Payer: Self-pay

## 2017-12-14 ENCOUNTER — Ambulatory Visit
Admission: RE | Admit: 2017-12-14 | Discharge: 2017-12-14 | Disposition: A | Discharge status: Home / Self Care | Payer: PPO | Source: Ambulatory Visit | Attending: Family Medicine | Admitting: Family Medicine

## 2017-12-14 DIAGNOSIS — Z1231 Encounter for screening mammogram for malignant neoplasm of breast: Secondary | ICD-10-CM | POA: Diagnosis not present

## 2017-12-14 DIAGNOSIS — Z1239 Encounter for other screening for malignant neoplasm of breast: Secondary | ICD-10-CM

## 2017-12-15 ENCOUNTER — Encounter (HOSPITAL_COMMUNITY)
Admission: RE | Admit: 2017-12-15 | Discharge: 2017-12-15 | Disposition: A | Discharge status: Home / Self Care | Payer: Self-pay | Source: Ambulatory Visit | Attending: Pulmonary Disease | Admitting: Pulmonary Disease

## 2017-12-20 ENCOUNTER — Encounter (HOSPITAL_COMMUNITY): Payer: Self-pay

## 2017-12-21 DIAGNOSIS — I10 Essential (primary) hypertension: Secondary | ICD-10-CM | POA: Diagnosis not present

## 2017-12-21 DIAGNOSIS — R002 Palpitations: Secondary | ICD-10-CM | POA: Diagnosis not present

## 2017-12-21 DIAGNOSIS — R079 Chest pain, unspecified: Secondary | ICD-10-CM | POA: Diagnosis not present

## 2017-12-21 DIAGNOSIS — R011 Cardiac murmur, unspecified: Secondary | ICD-10-CM | POA: Diagnosis not present

## 2017-12-21 DIAGNOSIS — I2699 Other pulmonary embolism without acute cor pulmonale: Secondary | ICD-10-CM | POA: Diagnosis not present

## 2017-12-21 DIAGNOSIS — R0602 Shortness of breath: Secondary | ICD-10-CM | POA: Diagnosis not present

## 2017-12-22 ENCOUNTER — Encounter (HOSPITAL_COMMUNITY)
Admission: RE | Admit: 2017-12-22 | Discharge: 2017-12-22 | Disposition: A | Discharge status: Home / Self Care | Payer: Self-pay | Source: Ambulatory Visit | Attending: Pulmonary Disease | Admitting: Pulmonary Disease

## 2017-12-27 ENCOUNTER — Encounter (HOSPITAL_COMMUNITY)
Admission: RE | Admit: 2017-12-27 | Discharge: 2017-12-27 | Disposition: A | Discharge status: Home / Self Care | Payer: Self-pay | Source: Ambulatory Visit | Attending: Pulmonary Disease | Admitting: Pulmonary Disease

## 2017-12-29 ENCOUNTER — Encounter (HOSPITAL_COMMUNITY): Payer: Self-pay

## 2017-12-29 DIAGNOSIS — I10 Essential (primary) hypertension: Secondary | ICD-10-CM | POA: Insufficient documentation

## 2017-12-29 DIAGNOSIS — R0602 Shortness of breath: Secondary | ICD-10-CM | POA: Insufficient documentation

## 2018-01-03 ENCOUNTER — Encounter (HOSPITAL_COMMUNITY)
Admission: RE | Admit: 2018-01-03 | Discharge: 2018-01-03 | Disposition: A | Discharge status: Home / Self Care | Payer: PPO | Source: Ambulatory Visit | Attending: Pulmonary Disease | Admitting: Pulmonary Disease

## 2018-01-05 ENCOUNTER — Encounter (HOSPITAL_COMMUNITY)
Admission: RE | Admit: 2018-01-05 | Discharge: 2018-01-05 | Disposition: A | Discharge status: Home / Self Care | Payer: PPO | Source: Ambulatory Visit | Attending: Pulmonary Disease | Admitting: Pulmonary Disease

## 2018-01-06 DIAGNOSIS — R0602 Shortness of breath: Secondary | ICD-10-CM | POA: Diagnosis not present

## 2018-01-06 DIAGNOSIS — R079 Chest pain, unspecified: Secondary | ICD-10-CM | POA: Diagnosis not present

## 2018-01-09 DIAGNOSIS — R6881 Early satiety: Secondary | ICD-10-CM | POA: Diagnosis not present

## 2018-01-09 DIAGNOSIS — Z7901 Long term (current) use of anticoagulants: Secondary | ICD-10-CM | POA: Diagnosis not present

## 2018-01-09 DIAGNOSIS — Z1211 Encounter for screening for malignant neoplasm of colon: Secondary | ICD-10-CM | POA: Diagnosis not present

## 2018-01-09 DIAGNOSIS — R110 Nausea: Secondary | ICD-10-CM | POA: Diagnosis not present

## 2018-01-10 ENCOUNTER — Encounter (HOSPITAL_COMMUNITY): Payer: Self-pay

## 2018-01-10 DIAGNOSIS — R300 Dysuria: Secondary | ICD-10-CM | POA: Diagnosis not present

## 2018-01-10 DIAGNOSIS — N390 Urinary tract infection, site not specified: Secondary | ICD-10-CM | POA: Diagnosis not present

## 2018-01-10 DIAGNOSIS — Z7901 Long term (current) use of anticoagulants: Secondary | ICD-10-CM | POA: Diagnosis not present

## 2018-01-11 DIAGNOSIS — R011 Cardiac murmur, unspecified: Secondary | ICD-10-CM | POA: Diagnosis not present

## 2018-01-11 DIAGNOSIS — I340 Nonrheumatic mitral (valve) insufficiency: Secondary | ICD-10-CM | POA: Diagnosis not present

## 2018-01-11 DIAGNOSIS — I10 Essential (primary) hypertension: Secondary | ICD-10-CM | POA: Diagnosis not present

## 2018-01-11 DIAGNOSIS — I2699 Other pulmonary embolism without acute cor pulmonale: Secondary | ICD-10-CM | POA: Diagnosis not present

## 2018-01-11 DIAGNOSIS — J984 Other disorders of lung: Secondary | ICD-10-CM | POA: Diagnosis not present

## 2018-01-11 DIAGNOSIS — E538 Deficiency of other specified B group vitamins: Secondary | ICD-10-CM | POA: Diagnosis not present

## 2018-01-12 ENCOUNTER — Encounter (HOSPITAL_COMMUNITY)
Admission: RE | Admit: 2018-01-12 | Discharge: 2018-01-12 | Disposition: A | Discharge status: Home / Self Care | Payer: Self-pay | Source: Ambulatory Visit | Attending: Pulmonary Disease | Admitting: Pulmonary Disease

## 2018-01-12 DIAGNOSIS — N390 Urinary tract infection, site not specified: Secondary | ICD-10-CM | POA: Diagnosis not present

## 2018-01-17 ENCOUNTER — Encounter (HOSPITAL_COMMUNITY)
Admission: RE | Admit: 2018-01-17 | Discharge: 2018-01-17 | Disposition: A | Discharge status: Home / Self Care | Payer: Self-pay | Source: Ambulatory Visit | Attending: Pulmonary Disease | Admitting: Pulmonary Disease

## 2018-01-19 ENCOUNTER — Encounter (HOSPITAL_COMMUNITY)
Admission: RE | Admit: 2018-01-19 | Discharge: 2018-01-19 | Disposition: A | Discharge status: Home / Self Care | Payer: Self-pay | Source: Ambulatory Visit | Attending: Pulmonary Disease | Admitting: Pulmonary Disease

## 2018-01-24 ENCOUNTER — Encounter (HOSPITAL_COMMUNITY)
Admission: RE | Admit: 2018-01-24 | Discharge: 2018-01-24 | Disposition: A | Discharge status: Home / Self Care | Payer: Medicare Other | Source: Ambulatory Visit | Attending: Pulmonary Disease | Admitting: Pulmonary Disease

## 2018-01-26 ENCOUNTER — Encounter (HOSPITAL_COMMUNITY)
Admission: RE | Admit: 2018-01-26 | Discharge: 2018-01-26 | Disposition: A | Discharge status: Home / Self Care | Payer: Medicare Other | Source: Ambulatory Visit | Attending: Pulmonary Disease | Admitting: Pulmonary Disease

## 2018-01-31 ENCOUNTER — Encounter (HOSPITAL_COMMUNITY)
Admission: RE | Admit: 2018-01-31 | Discharge: 2018-01-31 | Disposition: A | Discharge status: Home / Self Care | Payer: Self-pay | Source: Ambulatory Visit | Attending: Pulmonary Disease | Admitting: Pulmonary Disease

## 2018-01-31 DIAGNOSIS — R0602 Shortness of breath: Secondary | ICD-10-CM | POA: Insufficient documentation

## 2018-01-31 DIAGNOSIS — I10 Essential (primary) hypertension: Secondary | ICD-10-CM | POA: Insufficient documentation

## 2018-02-02 ENCOUNTER — Encounter (HOSPITAL_COMMUNITY): Payer: Self-pay

## 2018-02-07 ENCOUNTER — Encounter (HOSPITAL_COMMUNITY)
Admission: RE | Admit: 2018-02-07 | Discharge: 2018-02-07 | Disposition: A | Discharge status: Home / Self Care | Payer: Self-pay | Source: Ambulatory Visit | Attending: Internal Medicine | Admitting: Internal Medicine

## 2018-02-09 ENCOUNTER — Encounter (HOSPITAL_COMMUNITY)
Admission: RE | Admit: 2018-02-09 | Discharge: 2018-02-09 | Disposition: A | Discharge status: Home / Self Care | Payer: Self-pay | Source: Ambulatory Visit | Attending: Pulmonary Disease | Admitting: Pulmonary Disease

## 2018-02-14 ENCOUNTER — Encounter (HOSPITAL_COMMUNITY)
Admission: RE | Admit: 2018-02-14 | Discharge: 2018-02-14 | Disposition: A | Discharge status: Home / Self Care | Payer: Self-pay | Source: Ambulatory Visit | Attending: Pulmonary Disease | Admitting: Pulmonary Disease

## 2018-02-16 ENCOUNTER — Encounter (HOSPITAL_COMMUNITY)
Admission: RE | Admit: 2018-02-16 | Discharge: 2018-02-16 | Disposition: A | Discharge status: Home / Self Care | Payer: Medicare Other | Source: Ambulatory Visit | Attending: Pulmonary Disease | Admitting: Pulmonary Disease

## 2018-02-21 ENCOUNTER — Encounter (HOSPITAL_COMMUNITY)
Admission: RE | Admit: 2018-02-21 | Discharge: 2018-02-21 | Disposition: A | Discharge status: Home / Self Care | Payer: Self-pay | Source: Ambulatory Visit | Attending: Pulmonary Disease | Admitting: Pulmonary Disease

## 2018-02-21 ENCOUNTER — Encounter: Payer: Self-pay | Admitting: *Deleted

## 2018-02-22 ENCOUNTER — Encounter
Admission: RE | Disposition: A | Discharge status: Home / Self Care | Payer: Self-pay | Source: Ambulatory Visit | Attending: Internal Medicine

## 2018-02-22 ENCOUNTER — Encounter: Payer: Self-pay | Admitting: *Deleted

## 2018-02-22 ENCOUNTER — Ambulatory Visit
Admission: RE | Admit: 2018-02-22 | Discharge: 2018-02-22 | Disposition: A | Discharge status: Home / Self Care | Payer: PPO | Source: Ambulatory Visit | Attending: Internal Medicine | Admitting: Internal Medicine

## 2018-02-22 ENCOUNTER — Ambulatory Visit: Payer: PPO | Admitting: Anesthesiology

## 2018-02-22 DIAGNOSIS — I10 Essential (primary) hypertension: Secondary | ICD-10-CM | POA: Diagnosis not present

## 2018-02-22 DIAGNOSIS — Z7901 Long term (current) use of anticoagulants: Secondary | ICD-10-CM | POA: Insufficient documentation

## 2018-02-22 DIAGNOSIS — K635 Polyp of colon: Secondary | ICD-10-CM | POA: Diagnosis not present

## 2018-02-22 DIAGNOSIS — K5730 Diverticulosis of large intestine without perforation or abscess without bleeding: Secondary | ICD-10-CM | POA: Insufficient documentation

## 2018-02-22 DIAGNOSIS — Z86711 Personal history of pulmonary embolism: Secondary | ICD-10-CM | POA: Insufficient documentation

## 2018-02-22 DIAGNOSIS — K640 First degree hemorrhoids: Secondary | ICD-10-CM | POA: Diagnosis not present

## 2018-02-22 DIAGNOSIS — R6881 Early satiety: Secondary | ICD-10-CM | POA: Insufficient documentation

## 2018-02-22 DIAGNOSIS — F419 Anxiety disorder, unspecified: Secondary | ICD-10-CM | POA: Insufficient documentation

## 2018-02-22 DIAGNOSIS — D120 Benign neoplasm of cecum: Secondary | ICD-10-CM | POA: Diagnosis not present

## 2018-02-22 DIAGNOSIS — F329 Major depressive disorder, single episode, unspecified: Secondary | ICD-10-CM | POA: Insufficient documentation

## 2018-02-22 DIAGNOSIS — Z79891 Long term (current) use of opiate analgesic: Secondary | ICD-10-CM | POA: Insufficient documentation

## 2018-02-22 DIAGNOSIS — D175 Benign lipomatous neoplasm of intra-abdominal organs: Secondary | ICD-10-CM | POA: Diagnosis not present

## 2018-02-22 DIAGNOSIS — R110 Nausea: Secondary | ICD-10-CM | POA: Insufficient documentation

## 2018-02-22 DIAGNOSIS — K2970 Gastritis, unspecified, without bleeding: Secondary | ICD-10-CM | POA: Insufficient documentation

## 2018-02-22 DIAGNOSIS — Z79899 Other long term (current) drug therapy: Secondary | ICD-10-CM | POA: Diagnosis not present

## 2018-02-22 DIAGNOSIS — K5790 Diverticulosis of intestine, part unspecified, without perforation or abscess without bleeding: Secondary | ICD-10-CM | POA: Diagnosis not present

## 2018-02-22 DIAGNOSIS — Z1211 Encounter for screening for malignant neoplasm of colon: Secondary | ICD-10-CM | POA: Diagnosis not present

## 2018-02-22 DIAGNOSIS — K449 Diaphragmatic hernia without obstruction or gangrene: Secondary | ICD-10-CM | POA: Diagnosis not present

## 2018-02-22 DIAGNOSIS — K648 Other hemorrhoids: Secondary | ICD-10-CM | POA: Diagnosis not present

## 2018-02-22 HISTORY — DX: Anxiety disorder, unspecified: F41.9

## 2018-02-22 HISTORY — DX: Acquired absence of spleen: Z90.81

## 2018-02-22 HISTORY — DX: Other disorders of lung: J98.4

## 2018-02-22 HISTORY — PX: COLONOSCOPY WITH PROPOFOL: SHX5780

## 2018-02-22 HISTORY — DX: Major depressive disorder, single episode, unspecified: F32.9

## 2018-02-22 HISTORY — DX: Other thrombocytosis: D75.838

## 2018-02-22 HISTORY — DX: Irritable bowel syndrome, unspecified: K58.9

## 2018-02-22 HISTORY — DX: Depression, unspecified: F32.A

## 2018-02-22 HISTORY — DX: Other specified abnormal findings of blood chemistry: R79.89

## 2018-02-22 HISTORY — PX: ESOPHAGOGASTRODUODENOSCOPY: SHX5428

## 2018-02-22 SURGERY — EGD (ESOPHAGOGASTRODUODENOSCOPY)
Anesthesia: General

## 2018-02-22 MED ORDER — PROPOFOL 500 MG/50ML IV EMUL
INTRAVENOUS | Status: AC
  Filled 2018-02-22: qty 50

## 2018-02-22 MED ORDER — MIDAZOLAM HCL 5 MG/5ML IJ SOLN
INTRAMUSCULAR | Status: DC | PRN
  Administered 2018-02-22 (×2): 1 mg via INTRAVENOUS

## 2018-02-22 MED ORDER — SODIUM CHLORIDE 0.9 % IV SOLN
INTRAVENOUS | Status: DC
  Administered 2018-02-22 (×2): via INTRAVENOUS

## 2018-02-22 MED ORDER — FENTANYL CITRATE (PF) 100 MCG/2ML IJ SOLN
INTRAMUSCULAR | Status: DC | PRN
  Administered 2018-02-22 (×2): 50 ug via INTRAVENOUS

## 2018-02-22 MED ORDER — PROPOFOL 10 MG/ML IV BOLUS
INTRAVENOUS | Status: DC | PRN
  Administered 2018-02-22: 100 mg via INTRAVENOUS
  Administered 2018-02-22: 30 mg via INTRAVENOUS

## 2018-02-22 MED ORDER — PROPOFOL 500 MG/50ML IV EMUL
INTRAVENOUS | Status: DC | PRN
  Administered 2018-02-22: 160 ug/kg/min via INTRAVENOUS

## 2018-02-22 MED ORDER — LIDOCAINE 2% (20 MG/ML) 5 ML SYRINGE
INTRAMUSCULAR | Status: DC | PRN
  Administered 2018-02-22: 30 mg via INTRAVENOUS

## 2018-02-22 MED ORDER — FENTANYL CITRATE (PF) 100 MCG/2ML IJ SOLN
INTRAMUSCULAR | Status: AC
  Filled 2018-02-22: qty 2

## 2018-02-22 MED ORDER — MIDAZOLAM HCL 2 MG/2ML IJ SOLN
INTRAMUSCULAR | Status: AC
  Filled 2018-02-22: qty 2

## 2018-02-22 NOTE — Anesthesia Post-op Follow-up Note (Signed)
Anesthesia QCDR form completed.        

## 2018-02-22 NOTE — H&P (Signed)
Outpatient short stay form Pre-procedure 02/22/2018 10:52 AM Holly Bowen K. Alice Reichert, M.D.  Primary Physician: Juluis Pitch, M.D.  Reason for visit: Early satiety, nausea, colon cancer screening. No dysphagia.  History of present illness: 62 year old female presents with early satiety and a questionable history of gastroparesis.  Also has persistent nausea without significant vomiting.  Patient takes Xarelto long-term for history of pulmonary embolism in November 2018.  She was allowed to hold this prior to today's procedures.  Patient has history of chronic pain syndrome and has a history of narcotic dependency.  Patient has persistent chronic constipation as a result.    Current Facility-Administered Medications:  .  0.9 %  sodium chloride infusion, , Intravenous, Continuous, [place], Benay Pike, MD, Last Rate: 20 mL/hr at 02/22/18 1008  Medications Prior to Admission  Medication Sig Dispense Refill Last Dose  . acetaminophen (TYLENOL) 325 MG tablet Take 650 mg by mouth.   Past Week at Unknown time  . busPIRone (BUSPAR) 30 MG tablet Take by mouth.   02/21/2018 at Unknown time  . Cyanocobalamin 1000 MCG/ML LIQD Take by mouth.   02/21/2018 at Unknown time  . metoprolol (LOPRESSOR) 50 MG tablet Take 50 mg by mouth.   02/22/2018 at Unknown time  . oxycodone (OXY-IR) 5 MG capsule Take 5 mg by mouth.   02/21/2018 at Unknown time  . oxyCODONE (OXYCONTIN) 10 mg 12 hr tablet Take by mouth.   02/21/2018 at Unknown time  . lisinopril-hydrochlorothiazide (PRINZIDE) 20-12.5 MG per tablet Take by mouth.   Taking  . Misc. Devices [place] Patient is still unable to return to work at this time.   Not Taking at Unknown time  . risperiDONE (RISPERDAL) 0.25 MG tablet Take 0.25 mg by mouth. Patient only takes 1 tab at hour of sleep   Taking  . rivaroxaban (XARELTO) 20 MG TABS tablet Take by mouth.   02/18/2018     Allergies  Allergen Reactions  . Penicillins Anaphylaxis    Tongue and throat swelling     Past  Medical History:  Diagnosis Date  . Anxiety   . Depression   . Hypertension   . IBS (irritable bowel syndrome)   . Postsplenectomy thrombocytosis   . Restrictive lung disease     Review of systems:  Otherwise negative.    Physical Exam  Gen: Alert, oriented. Appears stated age.  HEENT: /AT. PERRLA. Lungs: CTA, no wheezes. CV: RR nl S1, S2. Abd: soft, benign, no masses. BS+ Ext: No edema. Pulses 2+    Planned procedures: Proceed with EGD and colonoscopy. The patient understands the nature of the planned procedure, indications, risks, alternatives and potential complications including but not limited to bleeding, infection, perforation, damage to internal organs and possible oversedation/side effects from anesthesia. The patient agrees and gives consent to proceed.  Please refer to procedure notes for findings, recommendations and patient disposition/instructions.     Zeki Bedrosian K. Alice Reichert, M.D. Gastroenterology 02/22/2018  10:52 AM

## 2018-02-22 NOTE — Op Note (Addendum)
[facility] Gastroenterology Patient Name: Holly Bowen Procedure Date: 02/22/2018 11:05 AM MRN: [id number] Account #: [id number] Date of Birth: August 19, 1955 Admit Type: Outpatient Age: 62 Room: [facility] ENDO ROOM 4 Gender: Female Note Status: Finalized Procedure:            Upper GI endoscopy Indications:          Early satiety, Nausea Providers:            Benay Pike. Alice Reichert MD, MD Referring MD:         Youlanda Roys. Lovie Macadamia, MD (Referring MD) Medicines:            Propofol per Anesthesia Complications:        No immediate complications. Procedure:            Pre-Anesthesia Assessment:                       - The risks and benefits of the procedure and the                        sedation options and risks were discussed with the                        patient. All questions were answered and informed                        consent was obtained.                       - Patient identification and proposed procedure were                        verified prior to the procedure by the nurse. The                        procedure was verified in the procedure room.                       - ASA Grade Assessment: II - A patient with mild                        systemic disease.                       - After reviewing the risks and benefits, the patient                        was deemed in satisfactory condition to undergo the                        procedure.                       After obtaining informed consent, the endoscope was                        passed under direct vision. Throughout the procedure,                        the patient's blood pressure, pulse, and oxygen  saturations were monitored continuously. The Endoscope                        was introduced through the mouth, and advanced to the                        third part of duodenum. The upper GI endoscopy was                        accomplished without difficulty. The patient tolerated                      the procedure well. Findings:      The examined esophagus was normal.      A 2 cm hiatal hernia was present.      Localized mild inflammation characterized by erosions and erythema was       found in the gastric antrum. Biopsies were taken with a cold forceps for       Helicobacter pylori testing.      The examined duodenum was normal.      The exam was otherwise without abnormality. Impression:           - Normal esophagus.                       - 2 cm hiatal hernia.                       - Gastritis. Biopsied.                       - Normal examined duodenum.                       - The examination was otherwise normal. Recommendation:       - Await pathology results.                       - Proceed with colonoscopy Procedure Code(s):    --- Professional ---                       [id number], Esophagogastroduodenoscopy, flexible, transoral;                        with biopsy, single or multiple Diagnosis Code(s):    --- Professional ---                       R11.0, Nausea                       R68.81, Early satiety                       K29.70, Gastritis, unspecified, without bleeding                       K44.9, Diaphragmatic hernia without obstruction or                        gangrene CPT copyright 2017 American Medical Association. All rights reserved. The codes documented in this report are preliminary and upon coder review may  be revised to meet current compliance requirements. Efrain Sella MD, MD 02/22/2018  11:23:58 AM This report has been signed electronically. Number of Addenda: 0 Note Initiated On: 02/22/2018 11:05 AM      [facility]

## 2018-02-22 NOTE — Transfer of Care (Signed)
Immediate Anesthesia Transfer of Care Note  Patient: Holly Bowen  Procedure(s) Performed: ESOPHAGOGASTRODUODENOSCOPY (EGD) (N/A ) COLONOSCOPY WITH PROPOFOL (N/A )  Patient Location: PACU and Endoscopy Unit  Anesthesia Type:General  Level of Consciousness: drowsy  Airway & Oxygen Therapy: Patient Spontanous Breathing and Patient connected to nasal cannula oxygen  Post-op Assessment: Report given to RN and Post -op Vital signs reviewed and stable  Post vital signs: Reviewed and stable  Last Vitals:  Vitals Value Taken Time  BP 106/54 02/22/2018 11:47 AM  Temp 36 C 02/22/2018 11:47 AM  Pulse 60 02/22/2018 11:48 AM  Resp 17 02/22/2018 11:48 AM  SpO2 100 % 02/22/2018 11:48 AM  Vitals shown include unvalidated device data.  Last Pain:  Vitals:   02/22/18 1145  TempSrc: Tympanic  PainSc: 0-No pain         Complications: No apparent anesthesia complications

## 2018-02-22 NOTE — Op Note (Signed)
[facility] Gastroenterology Patient Name: Holly Bowen Procedure Date: 02/22/2018 11:05 AM MRN: [id number] Account #: [id number] Date of Birth: March 13, 1956 Admit Type: Outpatient Age: 62 Room: [facility] ENDO ROOM 4 Gender: Female Note Status: Finalized Procedure:            Colonoscopy Indications:          Screening for colorectal malignant neoplasm Providers:            Benay Pike. Alice Reichert MD, MD Referring MD:         Youlanda Roys. Lovie Macadamia, MD (Referring MD) Medicines:            Propofol per Anesthesia Complications:        No immediate complications. Procedure:            Pre-Anesthesia Assessment:                       - The risks and benefits of the procedure and the                        sedation options and risks were discussed with the                        patient. All questions were answered and informed                        consent was obtained.                       - Patient identification and proposed procedure were                        verified prior to the procedure by the nurse. The                        procedure was verified in the procedure room.                       - ASA Grade Assessment: II - A patient with mild                        systemic disease.                       - After reviewing the risks and benefits, the patient                        was deemed in satisfactory condition to undergo the                        procedure.                       After obtaining informed consent, the colonoscope was                        passed under direct vision. Throughout the procedure,                        the patient's blood pressure, pulse, and oxygen  saturations were monitored continuously. The                        Colonoscope was introduced through the anus and                        advanced to the the cecum, identified by appendiceal                        orifice and ileocecal valve. The colonoscopy was                   performed without difficulty. The patient tolerated the                        procedure well. The quality of the bowel preparation                        was good. The ileocecal valve, appendiceal orifice, and                        rectum were photographed. Findings:      The perianal and digital rectal examinations were normal. Pertinent       negatives include normal sphincter tone and no palpable rectal lesions.      A 3 mm polyp was found in the ileocecal valve. The polyp was sessile.       The polyp was removed with a jumbo cold forceps. Resection and retrieval       were complete.      A 15 mm polyp was found in the proximal ascending colon. The polyp was       smooth, yellow and consistent with a benign lipoma. Biopsies were taken       with a cold forceps for histology.      Many small-mouthed diverticula were found in the sigmoid colon.      Non-bleeding internal hemorrhoids were found during retroflexion. The       hemorrhoids were Grade I (internal hemorrhoids that do not prolapse).      The exam was otherwise without abnormality. Impression:           - One 3 mm polyp at the ileocecal valve, removed with a                        jumbo cold forceps. Resected and retrieved.                       - One 15 mm polyp in the proximal ascending colon.                        Biopsied.                       - Diverticulosis in the sigmoid colon.                       - Non-bleeding internal hemorrhoids.                       - The examination was otherwise normal. Recommendation:       - Patient has a contact number available for  emergencies. The signs and symptoms of potential                        delayed complications were discussed with the patient.                        Return to normal activities tomorrow. Written discharge                        instructions were provided to the patient.                       - Await pathology results  from EGD, also performed                        today.                       - Resume previous diet.                       - Continue present medications.                       - Repeat colonoscopy is recommended for surveillance.                        The colonoscopy date will be determined after pathology                        results from today's exam become available for review.                       - Return to physician assistant in 3 months.                       - The findings and recommendations were discussed with                        the patient, their spouse and family. Procedure Code(s):    --- Professional ---                       [phone], Colonoscopy, flexible; with biopsy, single or                        multiple Diagnosis Code(s):    --- Professional ---                       K57.30, Diverticulosis of large intestine without                        perforation or abscess without bleeding                       K64.0, First degree hemorrhoids                       D12.2, Benign neoplasm of ascending colon                       D12.0, Benign neoplasm of cecum  Z12.11, Encounter for screening for malignant neoplasm                        of colon CPT copyright 2017 American Medical Association. All rights reserved. The codes documented in this report are preliminary and upon coder review may  be revised to meet current compliance requirements. Efrain Sella MD, MD 02/22/2018 11:43:39 AM This report has been signed electronically. Number of Addenda: 0 Note Initiated On: 02/22/2018 11:05 AM Scope Withdrawal Time: 0 hours 6 minutes 55 seconds  Total Procedure Duration: 0 hours 13 minutes 9 seconds       [facility] [place]

## 2018-02-22 NOTE — Anesthesia Preprocedure Evaluation (Signed)
Anesthesia Evaluation  Patient identified by MRN, date of birth, ID band Patient awake    Reviewed: Allergy & Precautions, H&P , NPO status , Patient's Chart, lab work & pertinent test results, reviewed documented beta blocker date and time   History of Anesthesia Complications (+) PONV and history of anesthetic complications  Airway Mallampati: III  TM Distance: >3 FB Neck ROM: full    Dental  (+) Dental Advidsory Given, Chipped   Pulmonary shortness of breath and with exertion, neg sleep apnea, neg COPD, neg recent URI,  Restrictive lung disease          Cardiovascular Exercise Tolerance: Good hypertension, (-) angina(-) CAD, (-) Past MI, (-) Cardiac Stents and (-) CABG (-) dysrhythmias (-) Valvular Problems/Murmurs     Neuro/Psych PSYCHIATRIC DISORDERS Anxiety Depression negative neurological ROS     GI/Hepatic negative GI ROS, Neg liver ROS,   Endo/Other  negative endocrine ROS  Renal/GU negative Renal ROS  negative genitourinary   Musculoskeletal   Abdominal   Peds  Hematology negative hematology ROS (+)   Anesthesia Other Findings Past Medical History: No date: Anxiety No date: Depression No date: Hypertension No date: IBS (irritable bowel syndrome) No date: Postsplenectomy thrombocytosis No date: Restrictive lung disease   Reproductive/Obstetrics negative OB ROS                             Anesthesia Physical Anesthesia Plan  ASA: II  Anesthesia Plan: General   Post-op Pain Management:    Induction: Intravenous  PONV Risk Score and Plan: 4 or greater and Propofol infusion and TIVA  Airway Management Planned: Nasal Cannula and Natural Airway  Additional Equipment:   Intra-op Plan:   Post-operative Plan:   Informed Consent: I have reviewed the patients History and Physical, chart, labs and discussed the procedure including the risks, benefits and alternatives for  the proposed anesthesia with the patient or authorized representative who has indicated his/her understanding and acceptance.   Dental Advisory Given  Plan Discussed with: Anesthesiologist, CRNA and Surgeon  Anesthesia Plan Comments:         Anesthesia Quick Evaluation

## 2018-02-22 NOTE — Interval H&P Note (Signed)
History and Physical Interval Note:  02/22/2018 10:55 AM  Holly Bowen  has presented today for surgery, with the diagnosis of [place]  The various methods of treatment have been discussed with the patient and family. After consideration of risks, benefits and other options for treatment, the patient has consented to  Procedure(s): ESOPHAGOGASTRODUODENOSCOPY (EGD) (N/A) COLONOSCOPY WITH PROPOFOL (N/A) as a surgical intervention .  The patient's history has been reviewed, patient examined, no change in status, stable for surgery.  I have reviewed the patient's chart and labs.  Questions were answered to the patient's satisfaction.     [place], [place]

## 2018-02-23 ENCOUNTER — Encounter: Payer: Self-pay | Admitting: Internal Medicine

## 2018-02-23 ENCOUNTER — Encounter (HOSPITAL_COMMUNITY)
Admission: RE | Admit: 2018-02-23 | Discharge: 2018-02-23 | Disposition: A | Discharge status: Home / Self Care | Payer: Self-pay | Source: Ambulatory Visit | Attending: Pulmonary Disease | Admitting: Pulmonary Disease

## 2018-02-23 LAB — SURGICAL PATHOLOGY

## 2018-02-23 NOTE — Anesthesia Postprocedure Evaluation (Signed)
Anesthesia Post Note  Patient: Holly Bowen  Procedure(s) Performed: ESOPHAGOGASTRODUODENOSCOPY (EGD) (N/A ) COLONOSCOPY WITH PROPOFOL (N/A )  Patient location during evaluation: Endoscopy Anesthesia Type: General Level of consciousness: awake and alert Pain management: pain level controlled Vital Signs Assessment: post-procedure vital signs reviewed and stable Respiratory status: spontaneous breathing, nonlabored ventilation, respiratory function stable and patient connected to nasal cannula oxygen Cardiovascular status: blood pressure returned to baseline and stable Postop Assessment: no apparent nausea or vomiting Anesthetic complications: no     Last Vitals:  Vitals:   02/22/18 1155 02/22/18 1205  BP: 119/66 125/68  Pulse: (!) 53 (!) 51  Resp: 18 14  Temp:    SpO2: 98% 97%    Last Pain:  Vitals:   02/22/18 1205  TempSrc:   PainSc: 0-No pain                 Martha Clan

## 2018-02-28 ENCOUNTER — Encounter (HOSPITAL_COMMUNITY)
Admission: RE | Admit: 2018-02-28 | Discharge: 2018-02-28 | Disposition: A | Discharge status: Home / Self Care | Payer: Self-pay | Source: Ambulatory Visit | Attending: Pulmonary Disease | Admitting: Pulmonary Disease

## 2018-02-28 DIAGNOSIS — I10 Essential (primary) hypertension: Secondary | ICD-10-CM | POA: Insufficient documentation

## 2018-02-28 DIAGNOSIS — R0602 Shortness of breath: Secondary | ICD-10-CM | POA: Insufficient documentation

## 2018-03-02 ENCOUNTER — Encounter (HOSPITAL_COMMUNITY)
Admission: RE | Admit: 2018-03-02 | Discharge: 2018-03-02 | Disposition: A | Discharge status: Home / Self Care | Payer: Self-pay | Source: Ambulatory Visit | Attending: Pulmonary Disease | Admitting: Pulmonary Disease

## 2018-03-02 DIAGNOSIS — R7309 Other abnormal glucose: Secondary | ICD-10-CM | POA: Diagnosis not present

## 2018-03-02 DIAGNOSIS — D649 Anemia, unspecified: Secondary | ICD-10-CM | POA: Diagnosis not present

## 2018-03-07 ENCOUNTER — Encounter (HOSPITAL_COMMUNITY)
Admission: RE | Admit: 2018-03-07 | Discharge: 2018-03-07 | Disposition: A | Discharge status: Home / Self Care | Payer: Self-pay | Source: Ambulatory Visit | Attending: Pulmonary Disease | Admitting: Pulmonary Disease

## 2018-03-09 ENCOUNTER — Encounter (HOSPITAL_COMMUNITY)
Admission: RE | Admit: 2018-03-09 | Discharge: 2018-03-09 | Disposition: A | Discharge status: Home / Self Care | Payer: Self-pay | Source: Ambulatory Visit | Attending: Pulmonary Disease | Admitting: Pulmonary Disease

## 2018-03-14 ENCOUNTER — Encounter (HOSPITAL_COMMUNITY)
Admission: RE | Admit: 2018-03-14 | Discharge: 2018-03-14 | Disposition: A | Discharge status: Home / Self Care | Payer: Self-pay | Source: Ambulatory Visit | Attending: Pulmonary Disease | Admitting: Pulmonary Disease

## 2018-03-16 ENCOUNTER — Encounter (HOSPITAL_COMMUNITY)
Admission: RE | Admit: 2018-03-16 | Discharge: 2018-03-16 | Disposition: A | Discharge status: Home / Self Care | Payer: Self-pay | Source: Ambulatory Visit | Attending: Pulmonary Disease | Admitting: Pulmonary Disease

## 2018-03-21 ENCOUNTER — Encounter (HOSPITAL_COMMUNITY)
Admission: RE | Admit: 2018-03-21 | Discharge: 2018-03-21 | Disposition: A | Discharge status: Home / Self Care | Payer: Self-pay | Source: Ambulatory Visit | Attending: Pulmonary Disease | Admitting: Pulmonary Disease

## 2018-03-23 ENCOUNTER — Encounter (HOSPITAL_COMMUNITY)
Admission: RE | Admit: 2018-03-23 | Discharge: 2018-03-23 | Disposition: A | Discharge status: Home / Self Care | Payer: Self-pay | Source: Ambulatory Visit | Attending: Pulmonary Disease | Admitting: Pulmonary Disease

## 2018-03-30 DIAGNOSIS — R6881 Early satiety: Secondary | ICD-10-CM | POA: Diagnosis not present

## 2018-03-30 DIAGNOSIS — R140 Abdominal distension (gaseous): Secondary | ICD-10-CM | POA: Diagnosis not present

## 2018-03-30 DIAGNOSIS — R110 Nausea: Secondary | ICD-10-CM | POA: Diagnosis not present

## 2018-04-13 ENCOUNTER — Encounter (HOSPITAL_COMMUNITY)
Admission: RE | Admit: 2018-04-13 | Discharge: 2018-04-13 | Disposition: A | Discharge status: Home / Self Care | Payer: Self-pay | Source: Ambulatory Visit | Attending: Pulmonary Disease | Admitting: Pulmonary Disease

## 2018-04-13 DIAGNOSIS — R0602 Shortness of breath: Secondary | ICD-10-CM | POA: Insufficient documentation

## 2018-04-13 DIAGNOSIS — I10 Essential (primary) hypertension: Secondary | ICD-10-CM | POA: Insufficient documentation

## 2018-04-18 ENCOUNTER — Encounter (HOSPITAL_COMMUNITY)
Admission: RE | Admit: 2018-04-18 | Discharge: 2018-04-18 | Disposition: A | Discharge status: Home / Self Care | Payer: Self-pay | Source: Ambulatory Visit | Attending: Pulmonary Disease | Admitting: Pulmonary Disease

## 2018-04-20 ENCOUNTER — Encounter (HOSPITAL_COMMUNITY)
Admission: RE | Admit: 2018-04-20 | Discharge: 2018-04-20 | Disposition: A | Discharge status: Home / Self Care | Payer: Self-pay | Source: Ambulatory Visit | Attending: Pulmonary Disease | Admitting: Pulmonary Disease

## 2018-04-24 DIAGNOSIS — R110 Nausea: Secondary | ICD-10-CM | POA: Diagnosis not present

## 2018-04-24 DIAGNOSIS — R6881 Early satiety: Secondary | ICD-10-CM | POA: Diagnosis not present

## 2018-04-25 ENCOUNTER — Encounter (HOSPITAL_COMMUNITY)
Admission: RE | Admit: 2018-04-25 | Discharge: 2018-04-25 | Disposition: A | Discharge status: Home / Self Care | Payer: Self-pay | Source: Ambulatory Visit | Attending: Pulmonary Disease | Admitting: Pulmonary Disease

## 2018-05-02 ENCOUNTER — Encounter (HOSPITAL_COMMUNITY)
Admission: RE | Admit: 2018-05-02 | Discharge: 2018-05-02 | Disposition: A | Discharge status: Home / Self Care | Payer: No Typology Code available for payment source | Source: Ambulatory Visit | Attending: Pulmonary Disease | Admitting: Pulmonary Disease

## 2018-05-02 DIAGNOSIS — R0602 Shortness of breath: Secondary | ICD-10-CM | POA: Diagnosis present

## 2018-05-02 DIAGNOSIS — I10 Essential (primary) hypertension: Secondary | ICD-10-CM | POA: Diagnosis not present

## 2018-05-04 ENCOUNTER — Encounter (HOSPITAL_COMMUNITY)
Admission: RE | Admit: 2018-05-04 | Discharge: 2018-05-04 | Disposition: A | Discharge status: Home / Self Care | Payer: No Typology Code available for payment source | Source: Ambulatory Visit | Attending: Pulmonary Disease | Admitting: Pulmonary Disease

## 2018-05-09 ENCOUNTER — Encounter (HOSPITAL_COMMUNITY)
Admission: RE | Admit: 2018-05-09 | Discharge: 2018-05-09 | Disposition: A | Discharge status: Home / Self Care | Payer: No Typology Code available for payment source | Source: Ambulatory Visit | Attending: Pulmonary Disease | Admitting: Pulmonary Disease

## 2018-05-11 ENCOUNTER — Encounter (HOSPITAL_COMMUNITY)
Admission: RE | Admit: 2018-05-11 | Discharge: 2018-05-11 | Disposition: A | Discharge status: Home / Self Care | Payer: No Typology Code available for payment source | Source: Ambulatory Visit | Attending: Pulmonary Disease | Admitting: Pulmonary Disease

## 2018-05-16 ENCOUNTER — Encounter (HOSPITAL_COMMUNITY)
Admission: RE | Admit: 2018-05-16 | Discharge: 2018-05-16 | Disposition: A | Discharge status: Home / Self Care | Payer: No Typology Code available for payment source | Source: Ambulatory Visit | Attending: Pulmonary Disease | Admitting: Pulmonary Disease

## 2018-05-18 ENCOUNTER — Encounter (HOSPITAL_COMMUNITY)
Admission: RE | Admit: 2018-05-18 | Discharge: 2018-05-18 | Disposition: A | Discharge status: Home / Self Care | Payer: No Typology Code available for payment source | Source: Ambulatory Visit | Attending: Pulmonary Disease | Admitting: Pulmonary Disease

## 2018-05-23 ENCOUNTER — Encounter (HOSPITAL_COMMUNITY): Payer: No Typology Code available for payment source

## 2018-05-25 ENCOUNTER — Encounter (HOSPITAL_COMMUNITY): Payer: No Typology Code available for payment source

## 2018-05-26 DIAGNOSIS — M545 Low back pain: Secondary | ICD-10-CM | POA: Diagnosis not present

## 2018-05-26 DIAGNOSIS — F329 Major depressive disorder, single episode, unspecified: Secondary | ICD-10-CM | POA: Diagnosis not present

## 2018-05-26 DIAGNOSIS — G8929 Other chronic pain: Secondary | ICD-10-CM | POA: Diagnosis not present

## 2018-05-26 DIAGNOSIS — I2699 Other pulmonary embolism without acute cor pulmonale: Secondary | ICD-10-CM | POA: Diagnosis not present

## 2018-05-26 DIAGNOSIS — I10 Essential (primary) hypertension: Secondary | ICD-10-CM | POA: Diagnosis not present

## 2018-05-30 ENCOUNTER — Encounter (HOSPITAL_COMMUNITY): Payer: No Typology Code available for payment source

## 2018-06-01 ENCOUNTER — Encounter (HOSPITAL_COMMUNITY)
Admission: RE | Admit: 2018-06-01 | Discharge: 2018-06-01 | Disposition: A | Discharge status: Home / Self Care | Payer: No Typology Code available for payment source | Source: Ambulatory Visit | Attending: Pulmonary Disease | Admitting: Pulmonary Disease

## 2018-06-01 DIAGNOSIS — R0602 Shortness of breath: Secondary | ICD-10-CM | POA: Diagnosis present

## 2018-06-01 DIAGNOSIS — I10 Essential (primary) hypertension: Secondary | ICD-10-CM | POA: Insufficient documentation

## 2018-06-06 ENCOUNTER — Encounter (HOSPITAL_COMMUNITY): Payer: No Typology Code available for payment source

## 2018-06-08 ENCOUNTER — Encounter (HOSPITAL_COMMUNITY)
Admission: RE | Admit: 2018-06-08 | Discharge: 2018-06-08 | Disposition: A | Discharge status: Home / Self Care | Payer: No Typology Code available for payment source | Source: Ambulatory Visit | Attending: Pulmonary Disease | Admitting: Pulmonary Disease

## 2018-06-13 ENCOUNTER — Encounter (HOSPITAL_COMMUNITY)
Admission: RE | Admit: 2018-06-13 | Discharge: 2018-06-13 | Disposition: A | Discharge status: Home / Self Care | Payer: No Typology Code available for payment source | Source: Ambulatory Visit | Attending: Pulmonary Disease | Admitting: Pulmonary Disease

## 2018-06-15 ENCOUNTER — Encounter (HOSPITAL_COMMUNITY)
Admission: RE | Admit: 2018-06-15 | Discharge: 2018-06-15 | Disposition: A | Discharge status: Home / Self Care | Payer: No Typology Code available for payment source | Source: Ambulatory Visit | Attending: Pulmonary Disease | Admitting: Pulmonary Disease

## 2018-06-20 ENCOUNTER — Encounter (HOSPITAL_COMMUNITY)
Admission: RE | Admit: 2018-06-20 | Discharge: 2018-06-20 | Disposition: A | Discharge status: Home / Self Care | Payer: No Typology Code available for payment source | Source: Ambulatory Visit | Attending: Pulmonary Disease | Admitting: Pulmonary Disease

## 2018-06-22 ENCOUNTER — Encounter (HOSPITAL_COMMUNITY)
Admission: RE | Admit: 2018-06-22 | Discharge: 2018-06-22 | Disposition: A | Discharge status: Home / Self Care | Payer: No Typology Code available for payment source | Source: Ambulatory Visit | Attending: Pulmonary Disease | Admitting: Pulmonary Disease

## 2018-06-27 ENCOUNTER — Encounter (HOSPITAL_COMMUNITY)
Admission: RE | Admit: 2018-06-27 | Discharge: 2018-06-27 | Disposition: A | Discharge status: Home / Self Care | Payer: No Typology Code available for payment source | Source: Ambulatory Visit | Attending: Pulmonary Disease | Admitting: Pulmonary Disease

## 2018-06-29 ENCOUNTER — Encounter (HOSPITAL_COMMUNITY)
Admission: RE | Admit: 2018-06-29 | Discharge: 2018-06-29 | Disposition: A | Discharge status: Home / Self Care | Payer: No Typology Code available for payment source | Source: Ambulatory Visit | Attending: Pulmonary Disease | Admitting: Pulmonary Disease

## 2018-07-04 ENCOUNTER — Encounter (HOSPITAL_COMMUNITY)
Admission: RE | Admit: 2018-07-04 | Discharge: 2018-07-04 | Disposition: A | Discharge status: Home / Self Care | Payer: No Typology Code available for payment source | Source: Ambulatory Visit | Attending: Pulmonary Disease | Admitting: Pulmonary Disease

## 2018-07-04 DIAGNOSIS — R0602 Shortness of breath: Secondary | ICD-10-CM | POA: Diagnosis present

## 2018-07-04 DIAGNOSIS — I10 Essential (primary) hypertension: Secondary | ICD-10-CM | POA: Diagnosis present

## 2018-07-06 ENCOUNTER — Encounter (HOSPITAL_COMMUNITY)
Admission: RE | Admit: 2018-07-06 | Discharge: 2018-07-06 | Disposition: A | Discharge status: Home / Self Care | Payer: No Typology Code available for payment source | Source: Ambulatory Visit | Attending: Pulmonary Disease | Admitting: Pulmonary Disease

## 2018-07-11 ENCOUNTER — Encounter (HOSPITAL_COMMUNITY): Payer: No Typology Code available for payment source

## 2018-07-11 DIAGNOSIS — I2699 Other pulmonary embolism without acute cor pulmonale: Secondary | ICD-10-CM | POA: Diagnosis not present

## 2018-07-11 DIAGNOSIS — I340 Nonrheumatic mitral (valve) insufficiency: Secondary | ICD-10-CM | POA: Diagnosis not present

## 2018-07-11 DIAGNOSIS — R002 Palpitations: Secondary | ICD-10-CM | POA: Diagnosis not present

## 2018-07-11 DIAGNOSIS — R011 Cardiac murmur, unspecified: Secondary | ICD-10-CM | POA: Diagnosis not present

## 2018-07-11 DIAGNOSIS — J984 Other disorders of lung: Secondary | ICD-10-CM | POA: Diagnosis not present

## 2018-07-11 DIAGNOSIS — I10 Essential (primary) hypertension: Secondary | ICD-10-CM | POA: Diagnosis not present

## 2018-07-11 DIAGNOSIS — R5383 Other fatigue: Secondary | ICD-10-CM | POA: Diagnosis not present

## 2018-07-11 DIAGNOSIS — R5381 Other malaise: Secondary | ICD-10-CM | POA: Diagnosis not present

## 2018-07-13 ENCOUNTER — Encounter (HOSPITAL_COMMUNITY)
Admission: RE | Admit: 2018-07-13 | Discharge: 2018-07-13 | Disposition: A | Discharge status: Home / Self Care | Payer: No Typology Code available for payment source | Source: Ambulatory Visit | Attending: Pulmonary Disease | Admitting: Pulmonary Disease

## 2018-07-18 ENCOUNTER — Encounter (HOSPITAL_COMMUNITY)
Admission: RE | Admit: 2018-07-18 | Discharge: 2018-07-18 | Disposition: A | Discharge status: Home / Self Care | Payer: No Typology Code available for payment source | Source: Ambulatory Visit | Attending: Pulmonary Disease | Admitting: Pulmonary Disease

## 2018-07-20 ENCOUNTER — Encounter (HOSPITAL_COMMUNITY)
Admission: RE | Admit: 2018-07-20 | Discharge: 2018-07-20 | Disposition: A | Discharge status: Home / Self Care | Payer: No Typology Code available for payment source | Source: Ambulatory Visit | Attending: Pulmonary Disease | Admitting: Pulmonary Disease

## 2018-07-25 ENCOUNTER — Encounter (HOSPITAL_COMMUNITY)
Admission: RE | Admit: 2018-07-25 | Discharge: 2018-07-25 | Disposition: A | Discharge status: Home / Self Care | Payer: No Typology Code available for payment source | Source: Ambulatory Visit | Attending: Pulmonary Disease | Admitting: Pulmonary Disease

## 2018-07-27 ENCOUNTER — Encounter (HOSPITAL_COMMUNITY)
Admission: RE | Admit: 2018-07-27 | Discharge: 2018-07-27 | Disposition: A | Discharge status: Home / Self Care | Payer: No Typology Code available for payment source | Source: Ambulatory Visit | Attending: Pulmonary Disease | Admitting: Pulmonary Disease

## 2018-08-01 ENCOUNTER — Encounter (HOSPITAL_COMMUNITY): Payer: No Typology Code available for payment source

## 2018-08-01 DIAGNOSIS — I10 Essential (primary) hypertension: Secondary | ICD-10-CM | POA: Diagnosis present

## 2018-08-01 DIAGNOSIS — R0602 Shortness of breath: Secondary | ICD-10-CM | POA: Diagnosis present

## 2018-08-03 ENCOUNTER — Encounter (HOSPITAL_COMMUNITY)
Admission: RE | Admit: 2018-08-03 | Discharge: 2018-08-03 | Disposition: A | Discharge status: Home / Self Care | Payer: No Typology Code available for payment source | Source: Ambulatory Visit | Attending: Pulmonary Disease | Admitting: Pulmonary Disease

## 2018-08-08 ENCOUNTER — Encounter (HOSPITAL_COMMUNITY): Payer: No Typology Code available for payment source

## 2018-08-08 DIAGNOSIS — I10 Essential (primary) hypertension: Secondary | ICD-10-CM | POA: Diagnosis not present

## 2018-08-10 ENCOUNTER — Other Ambulatory Visit: Payer: Self-pay

## 2018-08-10 ENCOUNTER — Encounter (HOSPITAL_COMMUNITY)
Admission: RE | Admit: 2018-08-10 | Discharge: 2018-08-10 | Disposition: A | Discharge status: Home / Self Care | Payer: No Typology Code available for payment source | Source: Ambulatory Visit | Attending: Pulmonary Disease | Admitting: Pulmonary Disease

## 2018-08-14 ENCOUNTER — Telehealth (HOSPITAL_COMMUNITY): Payer: Self-pay | Admitting: *Deleted

## 2018-08-15 ENCOUNTER — Encounter (HOSPITAL_COMMUNITY): Payer: No Typology Code available for payment source

## 2018-08-17 ENCOUNTER — Encounter (HOSPITAL_COMMUNITY): Payer: No Typology Code available for payment source

## 2018-08-22 ENCOUNTER — Encounter (HOSPITAL_COMMUNITY): Payer: No Typology Code available for payment source

## 2018-08-23 ENCOUNTER — Telehealth (HOSPITAL_COMMUNITY): Payer: Self-pay | Admitting: *Deleted

## 2018-08-24 ENCOUNTER — Encounter (HOSPITAL_COMMUNITY): Payer: No Typology Code available for payment source

## 2018-08-29 ENCOUNTER — Encounter (HOSPITAL_COMMUNITY): Payer: No Typology Code available for payment source

## 2018-08-31 ENCOUNTER — Encounter (HOSPITAL_COMMUNITY): Payer: No Typology Code available for payment source

## 2018-09-05 ENCOUNTER — Encounter (HOSPITAL_COMMUNITY): Payer: No Typology Code available for payment source

## 2018-09-07 ENCOUNTER — Encounter (HOSPITAL_COMMUNITY): Payer: No Typology Code available for payment source

## 2018-09-12 ENCOUNTER — Encounter (HOSPITAL_COMMUNITY): Payer: No Typology Code available for payment source

## 2018-09-12 ENCOUNTER — Encounter (HOSPITAL_COMMUNITY): Payer: Self-pay | Admitting: *Deleted

## 2018-09-12 NOTE — Progress Notes (Signed)
I was able to reach Holly Bowen to notify her her that the department is closed indefinitely due to the coronavirus. I instructed her that we would notify her when we reopened. I discussed with her food security, medications and exercise. She states that she has medications and food. She is walking some but I encouraged her to do more.

## 2018-09-14 ENCOUNTER — Encounter (HOSPITAL_COMMUNITY): Payer: No Typology Code available for payment source

## 2018-09-19 ENCOUNTER — Encounter (HOSPITAL_COMMUNITY): Payer: No Typology Code available for payment source

## 2018-09-21 ENCOUNTER — Encounter (HOSPITAL_COMMUNITY): Payer: No Typology Code available for payment source

## 2018-09-26 ENCOUNTER — Encounter (HOSPITAL_COMMUNITY): Payer: No Typology Code available for payment source

## 2018-09-28 ENCOUNTER — Encounter (HOSPITAL_COMMUNITY): Payer: No Typology Code available for payment source

## 2018-10-03 ENCOUNTER — Encounter (HOSPITAL_COMMUNITY): Payer: No Typology Code available for payment source

## 2018-10-05 ENCOUNTER — Encounter (HOSPITAL_COMMUNITY): Payer: No Typology Code available for payment source

## 2018-10-10 ENCOUNTER — Telehealth (HOSPITAL_COMMUNITY): Payer: Self-pay | Admitting: *Deleted

## 2018-10-10 ENCOUNTER — Encounter (HOSPITAL_COMMUNITY): Payer: No Typology Code available for payment source

## 2018-10-12 ENCOUNTER — Encounter (HOSPITAL_COMMUNITY): Payer: No Typology Code available for payment source

## 2018-10-17 ENCOUNTER — Encounter (HOSPITAL_COMMUNITY): Payer: No Typology Code available for payment source

## 2018-10-19 ENCOUNTER — Encounter (HOSPITAL_COMMUNITY): Payer: No Typology Code available for payment source

## 2018-10-24 ENCOUNTER — Encounter (HOSPITAL_COMMUNITY): Payer: No Typology Code available for payment source

## 2018-10-25 DIAGNOSIS — K5909 Other constipation: Secondary | ICD-10-CM | POA: Diagnosis not present

## 2018-10-25 DIAGNOSIS — R110 Nausea: Secondary | ICD-10-CM | POA: Diagnosis not present

## 2018-10-25 DIAGNOSIS — R140 Abdominal distension (gaseous): Secondary | ICD-10-CM | POA: Diagnosis not present

## 2018-10-25 DIAGNOSIS — R6881 Early satiety: Secondary | ICD-10-CM | POA: Diagnosis not present

## 2018-10-26 ENCOUNTER — Encounter (HOSPITAL_COMMUNITY): Payer: No Typology Code available for payment source

## 2018-10-31 ENCOUNTER — Encounter (HOSPITAL_COMMUNITY): Payer: No Typology Code available for payment source

## 2018-11-02 ENCOUNTER — Encounter (HOSPITAL_COMMUNITY): Payer: No Typology Code available for payment source

## 2018-11-07 ENCOUNTER — Encounter (HOSPITAL_COMMUNITY): Payer: No Typology Code available for payment source

## 2018-11-09 ENCOUNTER — Encounter (HOSPITAL_COMMUNITY): Payer: No Typology Code available for payment source

## 2018-11-14 ENCOUNTER — Encounter (HOSPITAL_COMMUNITY): Payer: No Typology Code available for payment source

## 2018-11-20 ENCOUNTER — Telehealth (HOSPITAL_COMMUNITY): Payer: Self-pay | Admitting: *Deleted

## 2018-11-21 ENCOUNTER — Telehealth (HOSPITAL_COMMUNITY): Payer: Self-pay | Admitting: *Deleted

## 2018-11-21 NOTE — Telephone Encounter (Signed)
Called patient to inform her that Maintenance pulmonary rehab remains closed and that large exercise groups are not possible at this time with 6 ft. social distancing guidelines.  She would like to be contacted if the maintenance program resumes in the future.

## 2019-01-10 DIAGNOSIS — I2699 Other pulmonary embolism without acute cor pulmonale: Secondary | ICD-10-CM | POA: Diagnosis not present

## 2019-01-10 DIAGNOSIS — J984 Other disorders of lung: Secondary | ICD-10-CM | POA: Diagnosis not present

## 2019-01-10 DIAGNOSIS — I10 Essential (primary) hypertension: Secondary | ICD-10-CM | POA: Diagnosis not present

## 2019-01-10 DIAGNOSIS — I340 Nonrheumatic mitral (valve) insufficiency: Secondary | ICD-10-CM | POA: Diagnosis not present

## 2019-01-17 DIAGNOSIS — E538 Deficiency of other specified B group vitamins: Secondary | ICD-10-CM | POA: Diagnosis not present

## 2019-01-17 DIAGNOSIS — R7309 Other abnormal glucose: Secondary | ICD-10-CM | POA: Diagnosis not present

## 2019-01-17 DIAGNOSIS — Z0000 Encounter for general adult medical examination without abnormal findings: Secondary | ICD-10-CM | POA: Diagnosis not present

## 2019-01-17 DIAGNOSIS — Z1331 Encounter for screening for depression: Secondary | ICD-10-CM | POA: Diagnosis not present

## 2019-01-17 DIAGNOSIS — F419 Anxiety disorder, unspecified: Secondary | ICD-10-CM | POA: Diagnosis not present

## 2019-01-17 DIAGNOSIS — K219 Gastro-esophageal reflux disease without esophagitis: Secondary | ICD-10-CM | POA: Diagnosis not present

## 2019-01-17 DIAGNOSIS — G894 Chronic pain syndrome: Secondary | ICD-10-CM | POA: Diagnosis not present

## 2019-01-17 DIAGNOSIS — E782 Mixed hyperlipidemia: Secondary | ICD-10-CM | POA: Diagnosis not present

## 2019-01-17 DIAGNOSIS — I10 Essential (primary) hypertension: Secondary | ICD-10-CM | POA: Diagnosis not present

## 2019-01-17 DIAGNOSIS — K589 Irritable bowel syndrome without diarrhea: Secondary | ICD-10-CM | POA: Diagnosis not present

## 2019-04-30 DIAGNOSIS — K5909 Other constipation: Secondary | ICD-10-CM | POA: Diagnosis not present

## 2019-04-30 DIAGNOSIS — K5900 Constipation, unspecified: Secondary | ICD-10-CM | POA: Diagnosis not present

## 2019-04-30 DIAGNOSIS — R110 Nausea: Secondary | ICD-10-CM | POA: Diagnosis not present

## 2019-04-30 DIAGNOSIS — M8588 Other specified disorders of bone density and structure, other site: Secondary | ICD-10-CM | POA: Diagnosis not present

## 2019-04-30 DIAGNOSIS — G894 Chronic pain syndrome: Secondary | ICD-10-CM | POA: Diagnosis not present

## 2019-04-30 DIAGNOSIS — Z7901 Long term (current) use of anticoagulants: Secondary | ICD-10-CM | POA: Diagnosis not present

## 2019-04-30 DIAGNOSIS — I2699 Other pulmonary embolism without acute cor pulmonale: Secondary | ICD-10-CM | POA: Diagnosis not present

## 2019-04-30 DIAGNOSIS — R6881 Early satiety: Secondary | ICD-10-CM | POA: Diagnosis not present

## 2019-07-17 DIAGNOSIS — I2699 Other pulmonary embolism without acute cor pulmonale: Secondary | ICD-10-CM | POA: Diagnosis not present

## 2019-07-17 DIAGNOSIS — I10 Essential (primary) hypertension: Secondary | ICD-10-CM | POA: Diagnosis not present

## 2019-07-17 DIAGNOSIS — J984 Other disorders of lung: Secondary | ICD-10-CM | POA: Diagnosis not present

## 2019-07-17 DIAGNOSIS — I340 Nonrheumatic mitral (valve) insufficiency: Secondary | ICD-10-CM | POA: Diagnosis not present

## 2019-07-17 DIAGNOSIS — I493 Ventricular premature depolarization: Secondary | ICD-10-CM | POA: Diagnosis not present

## 2019-07-27 DIAGNOSIS — I10 Essential (primary) hypertension: Secondary | ICD-10-CM | POA: Diagnosis not present

## 2019-07-27 DIAGNOSIS — F419 Anxiety disorder, unspecified: Secondary | ICD-10-CM | POA: Diagnosis not present

## 2019-07-27 DIAGNOSIS — G894 Chronic pain syndrome: Secondary | ICD-10-CM | POA: Diagnosis not present

## 2019-07-27 DIAGNOSIS — R7309 Other abnormal glucose: Secondary | ICD-10-CM | POA: Diagnosis not present

## 2019-07-27 DIAGNOSIS — Z23 Encounter for immunization: Secondary | ICD-10-CM | POA: Diagnosis not present

## 2019-10-17 ENCOUNTER — Other Ambulatory Visit: Payer: Self-pay | Admitting: Orthopedic Surgery

## 2019-10-25 ENCOUNTER — Encounter (HOSPITAL_COMMUNITY): Payer: Self-pay | Admitting: *Deleted

## 2019-10-25 ENCOUNTER — Telehealth (HOSPITAL_COMMUNITY): Payer: Self-pay | Admitting: *Deleted

## 2019-10-25 NOTE — Progress Notes (Signed)
Received referral from Dr. Kathi Ludwig for this pt to participate in pulmonary rehab with the the diagnosis of Restrictive lung disease. Holly Bowen is well known to pulmonary rehab staff due to prior participation in pulmonary rehab maintenance for many years. Clinical review of pt follow up appt on 10/02/19 Pulmonary office note.  Pt with Covid Risk Score - 3. Sent MD referral form for pulmonary rehab to complete and return.  Noted that pt will have on 6/16 cervical surgery here at [facility].  Will hold pt who is presently in a neck brace from engaging in exercise until cleared to proceed by pt neurosurgeon.  [name], BSN Cardiac and Training and development officer

## 2019-10-25 NOTE — Telephone Encounter (Signed)
Called and spoke to pt regarding referral for pulmonary rehab.  Advised that we would hold on exercise until after she has her surgery on 6/16 and cleared by her surgeon to participate in group exercise.  Pt verbalized understanding.  Asked Horris Latino to keep [place] posted on how she is doing and any scheduled follow up appt. [name], BSN Cardiac and Training and development officer

## 2019-10-30 DIAGNOSIS — R6881 Early satiety: Secondary | ICD-10-CM | POA: Diagnosis not present

## 2019-10-30 DIAGNOSIS — G894 Chronic pain syndrome: Secondary | ICD-10-CM | POA: Diagnosis not present

## 2019-10-30 DIAGNOSIS — Z7901 Long term (current) use of anticoagulants: Secondary | ICD-10-CM | POA: Diagnosis not present

## 2019-10-30 DIAGNOSIS — K5903 Drug induced constipation: Secondary | ICD-10-CM | POA: Diagnosis not present

## 2019-10-30 DIAGNOSIS — R110 Nausea: Secondary | ICD-10-CM | POA: Diagnosis not present

## 2019-11-06 NOTE — Progress Notes (Addendum)
[place] [address], [place] [address] Phone: [phone] Fax: [phone]    Your procedure is scheduled on Wednesday, June 16th.  Report to [facility] Main Entrance "A" at 6:30 A.M., and check in at the Admitting office.  Call this number if you have problems the morning of surgery:  [phone]  Call [phone] if you have any questions prior to your surgery date Monday-Friday 8am-4pm   Remember:  Do not eat after midnight the night before your surgery  You may drink clear liquids until 5:30 A.M.  the morning of your surgery.   Clear liquids allowed are: Water, Non-Citrus Juices (without pulp), Carbonated Beverages, Clear Tea, Black Coffee Only, and Gatorade    Enhanced Recovery after Surgery for Orthopedics Enhanced Recovery after Surgery is a protocol used to improve the stress on your body and your recovery after surgery.  Patient Instructions  . The night before surgery:  o No food after midnight. ONLY clear liquids after midnight  .  Marland Kitchen The day of surgery (if you do NOT have diabetes):  Drink ONE (1) Pre-Surgery Clear Ensure  By 4:30 A.M. the morning of surgery. Pre-Surgery Ensure depending on your surgery time. o Finish the drink at the designated time by the pre-op nurse.  o Nothing else to drink after completing the  Pre-Surgery Clear Ensure.         If you have questions, please contact your surgeon's office.   Take these medicines the morning of surgery with A SIP OF WATER      busPIRone (BUSPAR) DULoxetine (CYMBALTA)  metoprolol tartrate (LOPRESSOR)   If needed - acetaminophen (TYLENOL), ondansetron (ZOFRAN-ODT), Tapentadol HCl    Follow your surgeon's instructions on when to stop rivaroxaban (XARELTO).  If no instructions were given by your surgeon then you will need to call the office to get those instructions.    As of today, STOP taking any Aspirin (unless otherwise instructed by your  surgeon) and Aspirin containing products, Aleve, Naproxen, Ibuprofen, Motrin, Advil, Goody's, BC's, all herbal medications, fish oil, and all vitamins.             Do not wear jewelry, make up, or nail polish            Do not wear lotions, powders, perfumes, or deodorant.            Do not shave 48 hours prior to surgery.             Do not bring valuables to the hospital.            [facility] is not responsible for any belongings or valuables.  Do NOT Smoke (Tobacco/Vapping) or drink Alcohol 24 hours prior to your procedure If you use a CPAP at night, you may bring all equipment for your overnight stay.   Contacts, glasses, dentures or bridgework may not be worn into surgery.      For patients admitted to the hospital, discharge time will be determined by your treatment team.   Patients discharged the day of surgery will not be allowed to drive home, and someone needs to stay with them for 24 hours.  Special instructions:   [facility]- Preparing For Surgery  Before surgery, you can play an important role. Because skin is not sterile, your skin needs to be as free of germs as possible. You can reduce the number of germs on your skin by washing with CHG (chlorahexidine gluconate)  Soap before surgery.  CHG is an antiseptic cleaner which kills germs and bonds with the skin to continue killing germs even after washing.    Oral Hygiene is also important to reduce your risk of infection.  Remember - BRUSH YOUR TEETH THE MORNING OF SURGERY WITH YOUR REGULAR TOOTHPASTE  Please do not use if you have an allergy to CHG or antibacterial soaps. If your skin becomes reddened/irritated stop using the CHG.  Do not shave (including legs and underarms) for at least 48 hours prior to first CHG shower. It is OK to shave your face.  Please follow these instructions carefully.   1. Shower the NIGHT BEFORE SURGERY and the MORNING OF SURGERY with CHG Soap.   2. If you chose to wash your hair, wash your  hair first as usual with your normal shampoo.  3. After you shampoo, rinse your hair and body thoroughly to remove the shampoo.  4. Use CHG as you would any other liquid soap. You can apply CHG directly to the skin and wash gently with a scrungie or a clean washcloth.   5. Apply the CHG Soap to your body ONLY FROM THE NECK DOWN.  Do not use on open wounds or open sores. Avoid contact with your eyes, ears, mouth and genitals (private parts). Wash Face and genitals (private parts)  with your normal soap.   6. Wash thoroughly, paying special attention to the area where your surgery will be performed.  7. Thoroughly rinse your body with warm water from the neck down.  8. DO NOT shower/wash with your normal soap after using and rinsing off the CHG Soap.  9. Pat yourself dry with a CLEAN TOWEL.  10. Wear CLEAN PAJAMAS to bed the night before surgery, wear comfortable clothes the morning of surgery  11. Place CLEAN SHEETS on your bed the night of your first shower and DO NOT SLEEP WITH PETS.  Day of Surgery: Shower with CHG soap as instructed above.  Do not apply any deodorants/lotions.  Please wear clean clothes to the hospital/surgery center.   Remember to brush your teeth WITH YOUR REGULAR TOOTHPASTE.   Please read over the following fact sheets that you were given.

## 2019-11-07 ENCOUNTER — Other Ambulatory Visit: Payer: Self-pay

## 2019-11-07 ENCOUNTER — Encounter (HOSPITAL_COMMUNITY): Payer: Self-pay

## 2019-11-07 ENCOUNTER — Encounter (HOSPITAL_COMMUNITY)
Admission: RE | Admit: 2019-11-07 | Discharge: 2019-11-07 | Disposition: A | Discharge status: Home / Self Care | Payer: No Typology Code available for payment source | Source: Ambulatory Visit | Attending: Orthopedic Surgery | Admitting: Orthopedic Surgery

## 2019-11-07 ENCOUNTER — Ambulatory Visit (HOSPITAL_COMMUNITY)
Admission: RE | Admit: 2019-11-07 | Discharge: 2019-11-07 | Disposition: A | Discharge status: Home / Self Care | Payer: PPO | Source: Ambulatory Visit | Attending: Vascular Surgery | Admitting: Vascular Surgery

## 2019-11-07 DIAGNOSIS — R0600 Dyspnea, unspecified: Secondary | ICD-10-CM | POA: Diagnosis not present

## 2019-11-07 DIAGNOSIS — M542 Cervicalgia: Secondary | ICD-10-CM | POA: Insufficient documentation

## 2019-11-07 DIAGNOSIS — Z9081 Acquired absence of spleen: Secondary | ICD-10-CM | POA: Insufficient documentation

## 2019-11-07 DIAGNOSIS — F419 Anxiety disorder, unspecified: Secondary | ICD-10-CM | POA: Insufficient documentation

## 2019-11-07 DIAGNOSIS — Z981 Arthrodesis status: Secondary | ICD-10-CM | POA: Diagnosis not present

## 2019-11-07 DIAGNOSIS — Z01818 Encounter for other preprocedural examination: Secondary | ICD-10-CM | POA: Insufficient documentation

## 2019-11-07 DIAGNOSIS — F329 Major depressive disorder, single episode, unspecified: Secondary | ICD-10-CM | POA: Diagnosis not present

## 2019-11-07 DIAGNOSIS — Z7901 Long term (current) use of anticoagulants: Secondary | ICD-10-CM | POA: Diagnosis not present

## 2019-11-07 DIAGNOSIS — Z79899 Other long term (current) drug therapy: Secondary | ICD-10-CM | POA: Diagnosis not present

## 2019-11-07 DIAGNOSIS — Z86711 Personal history of pulmonary embolism: Secondary | ICD-10-CM | POA: Diagnosis not present

## 2019-11-07 DIAGNOSIS — I10 Essential (primary) hypertension: Secondary | ICD-10-CM | POA: Insufficient documentation

## 2019-11-07 HISTORY — DX: Other specified postprocedural states: Z98.890

## 2019-11-07 HISTORY — DX: Other specified postprocedural states: R11.2

## 2019-11-07 HISTORY — DX: Nausea with vomiting, unspecified: Z98.890

## 2019-11-07 HISTORY — DX: Dyspnea, unspecified: R06.00

## 2019-11-07 LAB — CBC WITH DIFFERENTIAL/PLATELET
Abs Immature Granulocytes: 0.02 10*3/uL (ref 0.00–0.07)
Basophils Absolute: 0.1 10*3/uL (ref 0.0–0.1)
Basophils Relative: 1 %
Eosinophils Absolute: 0.2 10*3/uL (ref 0.0–0.5)
Eosinophils Relative: 3 %
HCT: 37.8 % (ref 36.0–46.0)
Hemoglobin: 12.5 g/dL (ref 12.0–15.0)
Immature Granulocytes: 0 %
Lymphocytes Relative: 31 %
Lymphs Abs: 2.4 10*3/uL (ref 0.7–4.0)
MCH: 31.7 pg (ref 26.0–34.0)
MCHC: 33.1 g/dL (ref 30.0–36.0)
MCV: 95.9 fL (ref 80.0–100.0)
Monocytes Absolute: 0.7 10*3/uL (ref 0.1–1.0)
Monocytes Relative: 9 %
Neutro Abs: 4.3 10*3/uL (ref 1.7–7.7)
Neutrophils Relative %: 56 %
Platelets: 614 10*3/uL — ABNORMAL HIGH (ref 150–400)
RBC: 3.94 MIL/uL (ref 3.87–5.11)
RDW: 13 % (ref 11.5–15.5)
WBC: 7.8 10*3/uL (ref 4.0–10.5)
nRBC: 0 % (ref 0.0–0.2)

## 2019-11-07 LAB — COMPREHENSIVE METABOLIC PANEL
ALT: 20 U/L (ref 0–44)
AST: 20 U/L (ref 15–41)
Albumin: 4.1 g/dL (ref 3.5–5.0)
Alkaline Phosphatase: 84 U/L (ref 38–126)
Anion gap: 10 (ref 5–15)
BUN: 5 mg/dL — ABNORMAL LOW (ref 8–23)
CO2: 30 mmol/L (ref 22–32)
Calcium: 9.5 mg/dL (ref 8.9–10.3)
Chloride: 97 mmol/L — ABNORMAL LOW (ref 98–111)
Creatinine, Ser: 0.52 mg/dL (ref 0.44–1.00)
GFR calc Af Amer: >60 mL/min (ref >60)
GFR calc non Af Amer: >60 mL/min (ref >60)
Glucose, Bld: 105 mg/dL — ABNORMAL HIGH (ref 70–99)
Potassium: 3.1 mmol/L — ABNORMAL LOW (ref 3.5–5.1)
Sodium: 137 mmol/L (ref 135–145)
Total Bilirubin: 0.4 mg/dL (ref 0.3–1.2)
Total Protein: 6.9 g/dL (ref 6.5–8.1)

## 2019-11-07 LAB — URINALYSIS, ROUTINE W REFLEX MICROSCOPIC
Bilirubin Urine: NEGATIVE
Glucose, UA: NEGATIVE mg/dL
Hgb urine dipstick: NEGATIVE
Ketones, ur: NEGATIVE mg/dL
Leukocytes,Ua: NEGATIVE
Nitrite: NEGATIVE
Protein, ur: NEGATIVE mg/dL
Specific Gravity, Urine: 1.005 (ref 1.005–1.030)
pH: 6 (ref 5.0–8.0)

## 2019-11-07 LAB — TYPE AND SCREEN
ABO/RH(D): O POS
Antibody Screen: NEGATIVE

## 2019-11-07 LAB — SURGICAL PCR SCREEN
MRSA, PCR: NEGATIVE
Staphylococcus aureus: NEGATIVE

## 2019-11-07 NOTE — Progress Notes (Addendum)
Patient denies fever, cough or chest pain.  Patient has SOB due to restrictive lung disease.  PCP - Dr Lovie Macadamia Cardiologist - Dr Ubaldo Glassing Pulmonary - Dr Holli [facility]  Chest x-ray - 11/07/19 EKG - 01/10/19 C.E.(tracing requested) Stress Test - 01/16/18 C.E. ECHO - 01/06/18 C.E. Cardiac Cath - n/a  Blood Thinner Instructions:  Per patient, last dose of Xarelto will be on 11/09/19.  ERAS: Clears til 5:30 am DOS, Ensure drink given at PAT appt.  Anesthesia review: Yes, Ebony Hail PA saw pt at PAT visit.  Coronavirus Screening Covid test scheduled Mon., 11/12/19. Do you have any of the following symptoms:  Cough yes/no: No Fever (>100.84F)  yes/no: No Runny nose yes/no: No Sore throat yes/no: No Difficulty breathing/shortness of breath  Yes  Hx of restrictive lung disease, clot in lung.  Have you traveled in the last 14 days and where? yes/no: No  Patient verbalized understanding of instructions that were given to them at the PAT appointment.

## 2019-11-07 NOTE — Progress Notes (Addendum)
Anesthesia PAT Evaluation:  Case: [id number] Date/Time: 11/14/19 0815   Procedure: CERVICAL 4 - CERVICAL 7 POSTERIOR SPINAL FUSION WITH INSTRUMENTATION AND ALLOGRAFT (N/A )   Anesthesia type: General   Pre-op diagnosis: NECK PAIN, NONUNION AT CERVICAL 4-5 AND CERVICAL 5-6   Location: [facility] OR ROOM 05 / [place] OR   Surgeons: Phylliss Bob, MD      DISCUSSION: Patient is a 64 year old female scheduled for the above procedure.  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 She subsequently underwent C5-7 ACDF (2012), left rotator cuff repair, and splenectomy and is followed by Pain Management, as well as pulmonology for dyspnea (multi-factorial) including "prior chest cage restriction due to injury in the past" and post-operative PE following 2017 ACDF.   Last cardiology evaluation 07/17/19 for 41-month follow-up for symptomatic PVCs, mild MR, HTN, PE history on Xarelto. On going SOB. Ischemic work-up unremarkable in 2019. Awaiting pulmonology recommendations before considering any additional testing.      Last Pulmonologist evaluation 10/02/19. She felt exertional dyspnea had worsened over the past year. Echo ordered to assess for pulmonary hypertension or LV diastolic dysfunction. Echo has not been done yet due to awaiting approval from [employer].  Per Butch Penny, Dr. Lynann Bologna has preoperative input from primary care, cardiology, and pain management.. Discussed that given echo was recently ordered by pulmonology to evaluate for pulmonary  hypertension or LV dysfunction, then would need to get echo and/or input from pulmonology as well. I called Dr. Army Melia staff, and they are attempting to scheduled asap. Of note, Dr. Ubaldo Glassing has advised holding Xarelto for 3 days prior to surgery--patient is aware, but instead chooses to hold 5 days prior to surgery.     Patient evaluated at PAT visit. She denied chest pain and edema. She does get palpitations, which are not new and can occur for hours at a time (PVCs and PACs noted on 2019 event monitor). No syncope. No home oxygen of CPAP. She does feel short of breath when lying flat which is chronic. No SOB at rest. She does not have any significant SOB with low impact activities such as walking in her house or in a store, but if she is carrying items such as groceries then she will develop SOB. She reports that breathing had been better when she was able to participate in [facility], but it was stopped for several months during the COVID-19 pandemic, and she has noticed gradual worsening of exertional dyspnea since not participating in rehab. She hopes to begin again after recovery from surgery; however, reports her left shoulder has been hurting with movement or when lying on her left side, so it also needs to be further evaluated.    ADDENDUM 11/13/19 1:18 PM: Copies of surgical clearances received from cardiologist Dr. Bartholome Bill classifying patient as "low risk" from a cardiology standpoint with permission to hold Xarelto for 3 days prior to surgery; from PCP Dr. Juluis Pitch classifying patient as "medium risk"; and from pain management Dr. Darral Dash indicating plan for postoperative pain management. I have been in communication with staff at pulmonologist's Dr. Army Melia office regarding surgery plans  as well. They were able to schedule ordered echocardiogram which showed normal LVEF, no segmental wall motion abnormalities of the left ventricle, no significant valvular disease,  insufficient TR detected to calculate RVSP. Copy of EKG tracing received from [facility] and is on hard chart.  11/12/2019 presurgical COVID-19 test negative. Anesthesia team to evaluate on the day of surgery.    VS: BP (!) 157/84   Pulse (!) 59   Temp 37.1 C (Oral)   Resp 18   Ht 5\' 5"  (1.651 m)   Wt 79.6 kg   LMP  (LMP Unknown)   SpO2 100%   BMI 29.20 kg/m  Heart RRR, no murmur noted.  No ankle edema.  No carotid bruits noted.  Lungs with few bibasilar crackles,R > L that cleared with deep breathing. Mild limitation in neck mobility, mostly side to side. She denied limitation with mouth opening.   PROVIDERS: Juluis Pitch, MD is PCP [facility], see [facility] Care Everywhere) Bartholome Bill, MD is cardiologist Jef[facility] Bryant, see [facility] Everywhere) Cleta Alberts, MD is pulmonologist Jef[facility] Bryant, see [facility] Care Everywhere) Olean Ree, MD is GI [facility]ef[facility] Bryant, see [facility] Care Everywhere) Kandace Blitz, MD is Pain Management ([place])   LABS: Labs reviewed: Acceptable for surgery. PLT count 614K--known post-splenectomy thrombocytosis, although mostly PLT count in the 500K range. For PT/INR on the day of surgery.  (all labs ordered are listed, but only abnormal results are displayed)  Labs Reviewed  CBC WITH DIFFERENTIAL/PLATELET - Abnormal; Notable for the following components:      Result Value   Platelets 614 (*)    All other components within normal limits  COMPREHENSIVE METABOLIC PANEL - Abnormal; Notable for the following components:   Potassium 3.1 (*)    Chloride 97 (*)    Glucose, Bld 105 (*)    BUN 5 (*)    All other components within normal limits  SURGICAL PCR SCREEN  URINALYSIS, ROUTINE W REFLEX MICROSCOPIC  TYPE AND SCREEN     IMAGES: CHEST - 2 VIEW 11/07/19: COMPARISON:  01/11/2011 FINDINGS: Frontal and lateral views of the chest demonstrate an unremarkable cardiac silhouette. There is chronic elevation of the right hemidiaphragm. Subsegmental  atelectasis or scarring at the right lung base. No airspace disease, effusion, or pneumothorax. Postsurgical changes at the cervicothoracic junction. Stable embolic coils left upper quadrant. IMPRESSION: 1. No acute intrathoracic process.  CT C-spine 09/20/19 ([facility]): IMPRESSION: 1. ACDF changes at C4-C7. An ACDF plate is present at N0-N3 with a separate ACDF plate at Z7-Q7. There is osseous fusion across the C5-C6 and C6-C7 disc spaces. No definite complete osseous bridging across the C4-C5 disc space. 2. The hardware appears intact. There is a small periprosthetic lucency around the proximal aspect of the left C6 screw. 3. Limited evaluation of the central canal without intrathecal contrast material. Approximately mild central canal stenosis at C5-C6 and C6-C7. If a more detailed analysis was required consider CT myelogram. 4. Foraminal stenosis most pronounced at C6-C7, moderate on the right and mild to moderate on the left. 5. Anterolisthesis at C3-C4 measuring 2.4 mm. 6. Multilevel facet arthropathy, most pronounced at the right upper cervical region. 7. Possible right-sided thyroid nodule versus artifact related to the adjacent streak from the ACDF hardware. Recommend follow-up thyroid ultrasound. 8. A 4.5 mm pulmonary nodule in the visualized right upper lung lobe. See below guarding follow-up. Excerpt from "Guidelines for Management of Incidental Pulmonary Nodules Detected on CT Images: From the Fleischner Society 2017" Radiology 2017. Note: Guidelines do not  apply to lung cancer screening, patients with immunosuppression, or patients with known primary cancer. Nodule 30mm or less: Low risk- No routine follow up. High risk- Optional CT at 12 months. (Nodules less than 6 mm do not require routine follow-up, but certain patients at high risk with suspicious nodule morphology, upper lobe location, or both may warrant 12 month follow up). Electronically Signed by: Glennie Hawk   EKG: 01/10/19 ([facility]): Requested for [facility]. By Result Narrative: Sinus rhythm with frequent premature ventricular complexes When compared with ECG of 21-Dec-2017 09:42, premature ventricular complexes are now present Nonspecific T wave abnormalities no longer evident in Lateral leads I reviewed and concur with this report. Electronically signed DV:VOHY MD, KEN ([id number]) on 01/16/2019 9:23:35 AM   CV: Echo 11/12/19 [facility]): SUMMARY: The left ventricular size is normal. Basal left ventricular septal hypertrophy. Left ventricular systolic function is normal.  LVEF is 60 to 65%. Left ventricular filling pattern is prolonged relaxation. No segmental wall motion abnormality seen in the left ventricle. The right ventricle is normal in size and function. The left atrium is mildly dilated.  The right atrium size is normal. There is no significant valvular stenosis or regurgitation.  There is trace aortic regurgitation.  There is trace mitral regurgitation.  There is trace tricuspid regurgitation.  There was insufficient TR detected to calculate RV systolic pressure.  There is trace pulmonic valvular regurgitation. The aortic sinus is normal size. IVC size was mildly dilated. There is no pericardial effusion. (Comparison echo 01/06/18 [facility]: Normal LV/RV systolic function, LVEF > 55%, trivial AR/TR, mild MR)   [facility] Myocardial Perfusion Imaging 01/06/18 ([facility]): LVEF= 64% FINDINGS: Regional wall motion:reveals normal myocardial thickening and wall  motion. The overall quality of the study is good. Artifacts noted: no Left ventricular cavity: normal. Perfusion Analysis:SPECT images demonstrate homogeneous tracer  distribution throughout the myocardium. Result Impression: Negative ETT with excellent exercise tolerance.LV function normal at  64%.No evidence of significant reversible ischemia.Low risk study.   Holter Monitor 12/21/17 ([facility]): Summary: 1.Sinus rhythm with PACs and PVCs.No sustained runs.No pauses.   Past Medical History:  Diagnosis Date  . Anxiety   . Depression   . Dyspnea    hx clot in lung, SOB with exertion  . Hypertension   . IBS (irritable bowel syndrome)   . PONV (postoperative nausea and vomiting)   . 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 Restrictive lung disease     Past Surgical History:  Procedure Laterality Date  . ABDOMINAL HYSTERECTOMY    . BREAST BIOPSY Right    neg  . CERVICAL FUSION  2017  . COLONOSCOPY WITH PROPOFOL N/A 02/22/2018   Procedure: COLONOSCOPY WITH PROPOFOL;  Surgeon: Toledo, Benay Pike, MD;  Location: [facility] ENDOSCOPY;  Service: Gastroenterology;  Laterality: N/A;  . ESOPHAGOGASTRODUODENOSCOPY    . ESOPHAGOGASTRODUODENOSCOPY N/A 02/22/2018   Procedure: ESOPHAGOGASTRODUODENOSCOPY (EGD);  Surgeon: Toledo, Benay Pike, MD;  Location: [facility] ENDOSCOPY;  Service: Gastroenterology;  Laterality: N/A;  . spleen removed  2015    MEDICATIONS: . acetaminophen (TYLENOL) 325 MG tablet  . busPIRone (BUSPAR) 30 MG tablet  . Cyanocobalamin 1000 MCG/ML LIQD  . DULoxetine (CYMBALTA) 60 MG capsule  . lisinopril-hydrochlorothiazide (ZESTORETIC) 20-25 MG tablet  . metoprolol tartrate (LOPRESSOR) 100 MG tablet  . ondansetron (ZOFRAN-ODT) 4 MG disintegrating tablet  . risperiDONE (RISPERDAL) 0.25 MG tablet  . rivaroxaban (XARELTO) 20 MG TABS tablet  . Tapentadol HCl 150 MG TB12   No current facility-administered medications for this encounter.  Myra Gianotti, PA-C Surgical Short Stay/Anesthesiology [facility] Phone [phone] [facility] Phone [phone] 11/07/2019 6:52 PM

## 2019-11-12 ENCOUNTER — Other Ambulatory Visit (HOSPITAL_COMMUNITY)
Admission: RE | Admit: 2019-11-12 | Discharge: 2019-11-12 | Disposition: A | Discharge status: Home / Self Care | Payer: PPO | Source: Ambulatory Visit | Attending: Orthopedic Surgery | Admitting: Orthopedic Surgery

## 2019-11-12 DIAGNOSIS — Z01812 Encounter for preprocedural laboratory examination: Secondary | ICD-10-CM | POA: Diagnosis not present

## 2019-11-12 DIAGNOSIS — Z20822 Contact with and (suspected) exposure to covid-19: Secondary | ICD-10-CM | POA: Insufficient documentation

## 2019-11-12 LAB — SARS CORONAVIRUS 2 (TAT 6-24 HRS): SARS Coronavirus 2: NEGATIVE

## 2019-11-13 NOTE — Anesthesia Preprocedure Evaluation (Addendum)
Anesthesia Evaluation  Patient identified by MRN, date of birth, ID band Patient awake    Reviewed: Allergy & Precautions, NPO status , Patient's Chart, lab work & pertinent test results, reviewed documented beta blocker date and time   History of Anesthesia Complications (+) PONV and history of anesthetic complications  Airway Mallampati: II  TM Distance: >3 FB Neck ROM: Limited    Dental  (+) Teeth Intact, Dental Advisory Given   Pulmonary  RLD   Pulmonary exam normal breath sounds clear to auscultation       Cardiovascular hypertension, Pt. on home beta blockers and Pt. on medications Normal cardiovascular exam Rhythm:Regular Rate:Normal     Neuro/Psych PSYCHIATRIC DISORDERS Anxiety Depression  NECK PAIN, NONUNION AT CERVICAL 4-5 AND CERVICAL 5-6    GI/Hepatic negative GI ROS, Neg liver ROS,   Endo/Other  negative endocrine ROS  Renal/GU negative Renal ROS     Musculoskeletal negative musculoskeletal ROS (+)   Abdominal   Peds  Hematology  (+) Blood dyscrasia (Xarelto), ,   Anesthesia Other Findings   Reproductive/Obstetrics                            Anesthesia Physical Anesthesia Plan  ASA: II  Anesthesia Plan: General   Post-op Pain Management:    Induction: Intravenous  PONV Risk Score and Plan: 4 or greater and Midazolam, Dexamethasone, Ondansetron, Scopolamine patch - Pre-op and TIVA  Airway Management Planned: Oral ETT and Video Laryngoscope Planned  Additional Equipment:   Intra-op Plan:   Post-operative Plan: Extubation in OR  Informed Consent: I have reviewed the patients History and Physical, chart, labs and discussed the procedure including the risks, benefits and alternatives for the proposed anesthesia with the patient or authorized representative who has indicated his/her understanding and acceptance.     Dental advisory given  Plan Discussed with:  CRNA  Anesthesia Plan Comments: (PAT note writte by Myra Gianotti, PA-C. )      Anesthesia Quick Evaluation

## 2019-11-14 ENCOUNTER — Observation Stay (HOSPITAL_COMMUNITY)
Admission: RE | Admit: 2019-11-14 | Discharge: 2019-11-15 | Disposition: A | Discharge status: Home / Self Care | Payer: No Typology Code available for payment source | Attending: Orthopedic Surgery | Admitting: Orthopedic Surgery

## 2019-11-14 ENCOUNTER — Ambulatory Visit (HOSPITAL_COMMUNITY): Payer: PPO

## 2019-11-14 ENCOUNTER — Encounter (HOSPITAL_COMMUNITY): Payer: Self-pay | Admitting: Orthopedic Surgery

## 2019-11-14 ENCOUNTER — Ambulatory Visit (HOSPITAL_COMMUNITY): Payer: No Typology Code available for payment source | Admitting: Vascular Surgery

## 2019-11-14 ENCOUNTER — Encounter (HOSPITAL_COMMUNITY)
Admission: RE | Disposition: A | Discharge status: Home / Self Care | Payer: Self-pay | Source: Home / Self Care | Attending: Orthopedic Surgery

## 2019-11-14 ENCOUNTER — Ambulatory Visit (HOSPITAL_COMMUNITY): Payer: PPO | Attending: Orthopedic Surgery

## 2019-11-14 ENCOUNTER — Other Ambulatory Visit: Payer: Self-pay

## 2019-11-14 ENCOUNTER — Ambulatory Visit (HOSPITAL_COMMUNITY): Payer: No Typology Code available for payment source | Admitting: Anesthesiology

## 2019-11-14 DIAGNOSIS — Z01818 Encounter for other preprocedural examination: Secondary | ICD-10-CM | POA: Diagnosis not present

## 2019-11-14 DIAGNOSIS — Z880 Allergy status to penicillin: Secondary | ICD-10-CM | POA: Diagnosis not present

## 2019-11-14 DIAGNOSIS — Z419 Encounter for procedure for purposes other than remedying health state, unspecified: Secondary | ICD-10-CM

## 2019-11-14 DIAGNOSIS — M960 Pseudarthrosis after fusion or arthrodesis: Secondary | ICD-10-CM | POA: Diagnosis not present

## 2019-11-14 DIAGNOSIS — Z79899 Other long term (current) drug therapy: Secondary | ICD-10-CM | POA: Diagnosis not present

## 2019-11-14 DIAGNOSIS — I10 Essential (primary) hypertension: Secondary | ICD-10-CM | POA: Insufficient documentation

## 2019-11-14 DIAGNOSIS — Z7901 Long term (current) use of anticoagulants: Secondary | ICD-10-CM | POA: Diagnosis not present

## 2019-11-14 DIAGNOSIS — F329 Major depressive disorder, single episode, unspecified: Secondary | ICD-10-CM | POA: Insufficient documentation

## 2019-11-14 DIAGNOSIS — F419 Anxiety disorder, unspecified: Secondary | ICD-10-CM | POA: Insufficient documentation

## 2019-11-14 DIAGNOSIS — Y832 Surgical operation with anastomosis, bypass or graft as the cause of abnormal reaction of the patient, or of later complication, without mention of misadventure at the time of the procedure: Secondary | ICD-10-CM | POA: Diagnosis not present

## 2019-11-14 DIAGNOSIS — S129XXA Fracture of neck, unspecified, initial encounter: Secondary | ICD-10-CM | POA: Diagnosis present

## 2019-11-14 HISTORY — PX: POSTERIOR CERVICAL FUSION/FORAMINOTOMY: SHX5038

## 2019-11-14 LAB — PROTIME-INR
INR: 1 (ref 0.8–1.2)
Prothrombin Time: 12.8 seconds (ref 11.4–15.2)

## 2019-11-14 LAB — APTT: aPTT: 30 seconds (ref 24–36)

## 2019-11-14 SURGERY — POSTERIOR CERVICAL FUSION/FORAMINOTOMY LEVEL 4
Anesthesia: General | Site: Spine Cervical

## 2019-11-14 MED ORDER — SUGAMMADEX SODIUM 200 MG/2ML IV SOLN
INTRAVENOUS | Status: DC | PRN
  Administered 2019-11-14: 200 mg via INTRAVENOUS

## 2019-11-14 MED ORDER — ACETAMINOPHEN 650 MG RE SUPP: 650.0000 mg | RECTAL | Status: DC | PRN

## 2019-11-14 MED ORDER — SODIUM CHLORIDE 0.9 % IV SOLN
250.0000 mL | INTRAVENOUS | Status: DC
  Administered 2019-11-14: 250 mL via INTRAVENOUS

## 2019-11-14 MED ORDER — BUPIVACAINE-EPINEPHRINE (PF) 0.25% -1:200000 IJ SOLN
INTRAMUSCULAR | Status: AC
  Filled 2019-11-14: qty 30

## 2019-11-14 MED ORDER — ZOLPIDEM TARTRATE 5 MG PO TABS: 5.0000 mg | ORAL_TABLET | Freq: Every evening | ORAL | Status: DC | PRN

## 2019-11-14 MED ORDER — CYANOCOBALAMIN 1000 MCG/ML PO LIQD: 1.0000 [drp] | Freq: Every day | ORAL | Status: DC

## 2019-11-14 MED ORDER — DIPHENHYDRAMINE HCL 50 MG/ML IJ SOLN
INTRAMUSCULAR | Status: DC | PRN
Start: 2019-11-14 — End: 2019-11-14
  Administered 2019-11-14: 12.5 mg via INTRAVENOUS

## 2019-11-14 MED ORDER — FENTANYL CITRATE (PF) 100 MCG/2ML IJ SOLN
INTRAMUSCULAR | Status: DC | PRN
  Administered 2019-11-14: 150 ug via INTRAVENOUS
  Administered 2019-11-14: 100 ug via INTRAVENOUS

## 2019-11-14 MED ORDER — OXYCODONE-ACETAMINOPHEN 5-325 MG PO TABS
1.0000 | ORAL_TABLET | ORAL | Status: DC | PRN
  Administered 2019-11-14 – 2019-11-15 (×4): 2 via ORAL
  Filled 2019-11-14 (×4): qty 2

## 2019-11-14 MED ORDER — PHENYLEPHRINE HCL (PRESSORS) 10 MG/ML IV SOLN
INTRAVENOUS | Status: DC | PRN
Start: 2019-11-14 — End: 2019-11-14
  Administered 2019-11-14: 100 ug via INTRAVENOUS

## 2019-11-14 MED ORDER — MIDAZOLAM HCL 5 MG/5ML IJ SOLN
INTRAMUSCULAR | Status: DC | PRN
  Administered 2019-11-14: 2 mg via INTRAVENOUS

## 2019-11-14 MED ORDER — MIDAZOLAM HCL 2 MG/2ML IJ SOLN
INTRAMUSCULAR | Status: AC
  Filled 2019-11-14: qty 2

## 2019-11-14 MED ORDER — DULOXETINE HCL 60 MG PO CPEP
60.0000 mg | ORAL_CAPSULE | Freq: Every day | ORAL | Status: DC
  Administered 2019-11-15: 60 mg via ORAL
  Filled 2019-11-14: qty 1
  Filled 2019-11-14: qty 2

## 2019-11-14 MED ORDER — VANCOMYCIN HCL IN DEXTROSE 1-5 GM/200ML-% IV SOLN: 1000.0000 mg | INTRAVENOUS | Status: AC

## 2019-11-14 MED ORDER — ACETAMINOPHEN 500 MG PO TABS
ORAL_TABLET | ORAL | Status: AC
  Administered 2019-11-14: 1000 mg via ORAL
  Filled 2019-11-14: qty 2

## 2019-11-14 MED ORDER — MENTHOL 3 MG MT LOZG: 1.0000 | LOZENGE | OROMUCOSAL | Status: DC | PRN

## 2019-11-14 MED ORDER — PROPOFOL 10 MG/ML IV BOLUS
INTRAVENOUS | Status: DC | PRN
  Administered 2019-11-14: 200 mg via INTRAVENOUS

## 2019-11-14 MED ORDER — RISPERIDONE 0.5 MG PO TABS
0.5000 mg | ORAL_TABLET | Freq: Every day | ORAL | Status: DC
  Administered 2019-11-14: 0.5 mg via ORAL
  Filled 2019-11-14 (×2): qty 1

## 2019-11-14 MED ORDER — LIDOCAINE 2% (20 MG/ML) 5 ML SYRINGE
INTRAMUSCULAR | Status: DC | PRN
  Administered 2019-11-14: 30 mg via INTRAVENOUS

## 2019-11-14 MED ORDER — ONDANSETRON 4 MG PO TBDP
4.0000 mg | ORAL_TABLET | Freq: Three times a day (TID) | ORAL | Status: DC | PRN
  Filled 2019-11-14: qty 1

## 2019-11-14 MED ORDER — HYDROCODONE-ACETAMINOPHEN 5-325 MG PO TABS: 1.0000 | ORAL_TABLET | Freq: Three times a day (TID) | ORAL | Status: DC | PRN

## 2019-11-14 MED ORDER — CHLORHEXIDINE GLUCONATE 0.12 % MT SOLN
OROMUCOSAL | Status: AC
  Administered 2019-11-14: 15 mL via OROMUCOSAL
  Filled 2019-11-14: qty 15

## 2019-11-14 MED ORDER — ACETAMINOPHEN 500 MG PO TABS: 1000.0000 mg | ORAL_TABLET | Freq: Once | ORAL | Status: AC

## 2019-11-14 MED ORDER — OXYCODONE HCL ER 10 MG PO T12A
10.0000 mg | EXTENDED_RELEASE_TABLET | Freq: Two times a day (BID) | ORAL | Status: DC
  Administered 2019-11-14 – 2019-11-15 (×3): 10 mg via ORAL
  Filled 2019-11-14 (×3): qty 1

## 2019-11-14 MED ORDER — HYDROCHLOROTHIAZIDE 25 MG PO TABS
25.0000 mg | ORAL_TABLET | Freq: Every day | ORAL | Status: DC
  Administered 2019-11-14 – 2019-11-15 (×2): 25 mg via ORAL
  Filled 2019-11-14 (×2): qty 1

## 2019-11-14 MED ORDER — ORAL CARE MOUTH RINSE: 15.0000 mL | Freq: Once | OROMUCOSAL | Status: AC

## 2019-11-14 MED ORDER — BUPIVACAINE LIPOSOME 1.3 % IJ SUSP
INTRAMUSCULAR | Status: DC | PRN
  Administered 2019-11-14: 20 mL

## 2019-11-14 MED ORDER — BUPIVACAINE LIPOSOME 1.3 % IJ SUSP
20.0000 mL | Freq: Once | INTRAMUSCULAR | Status: DC
  Filled 2019-11-14: qty 20

## 2019-11-14 MED ORDER — LACTATED RINGERS IV SOLN: INTRAVENOUS | Status: DC

## 2019-11-14 MED ORDER — ONDANSETRON HCL 4 MG/2ML IJ SOLN: 4.0000 mg | Freq: Once | INTRAMUSCULAR | Status: DC | PRN

## 2019-11-14 MED ORDER — FENTANYL CITRATE (PF) 100 MCG/2ML IJ SOLN
25.0000 ug | INTRAMUSCULAR | Status: DC | PRN
  Administered 2019-11-14 (×4): 25 ug via INTRAVENOUS

## 2019-11-14 MED ORDER — BUPIVACAINE-EPINEPHRINE 0.25% -1:200000 IJ SOLN
INTRAMUSCULAR | Status: DC | PRN
  Administered 2019-11-14: 30 mL

## 2019-11-14 MED ORDER — LABETALOL HCL 5 MG/ML IV SOLN
INTRAVENOUS | Status: DC | PRN
Start: 2019-11-14 — End: 2019-11-14
  Administered 2019-11-14: 5 mg via INTRAVENOUS

## 2019-11-14 MED ORDER — ALUM & MAG HYDROXIDE-SIMETH 200-200-20 MG/5ML PO SUSP: 30.0000 mL | Freq: Four times a day (QID) | ORAL | Status: DC | PRN

## 2019-11-14 MED ORDER — FENTANYL CITRATE (PF) 100 MCG/2ML IJ SOLN
INTRAMUSCULAR | Status: AC
  Filled 2019-11-14: qty 2

## 2019-11-14 MED ORDER — BUSPIRONE HCL 15 MG PO TABS
30.0000 mg | ORAL_TABLET | Freq: Every day | ORAL | Status: DC
  Administered 2019-11-15: 30 mg via ORAL
  Filled 2019-11-14: qty 2

## 2019-11-14 MED ORDER — DOCUSATE SODIUM 100 MG PO CAPS
100.0000 mg | ORAL_CAPSULE | Freq: Two times a day (BID) | ORAL | Status: DC
  Administered 2019-11-14 – 2019-11-15 (×3): 100 mg via ORAL
  Filled 2019-11-14 (×3): qty 1

## 2019-11-14 MED ORDER — PROPOFOL 10 MG/ML IV BOLUS
INTRAVENOUS | Status: AC
  Filled 2019-11-14: qty 40

## 2019-11-14 MED ORDER — FLEET ENEMA 7-19 GM/118ML RE ENEM: 1.0000 | ENEMA | Freq: Once | RECTAL | Status: DC | PRN

## 2019-11-14 MED ORDER — SUCCINYLCHOLINE CHLORIDE 20 MG/ML IJ SOLN
INTRAMUSCULAR | Status: DC | PRN
Start: 2019-11-14 — End: 2019-11-14
  Administered 2019-11-14: 100 mg via INTRAVENOUS

## 2019-11-14 MED ORDER — BISACODYL 5 MG PO TBEC: 5.0000 mg | DELAYED_RELEASE_TABLET | Freq: Every day | ORAL | Status: DC | PRN

## 2019-11-14 MED ORDER — LISINOPRIL 20 MG PO TABS
20.0000 mg | ORAL_TABLET | Freq: Every day | ORAL | Status: DC
  Administered 2019-11-14 – 2019-11-15 (×2): 20 mg via ORAL
  Filled 2019-11-14 (×2): qty 1

## 2019-11-14 MED ORDER — ONDANSETRON HCL 4 MG PO TABS: 4.0000 mg | ORAL_TABLET | Freq: Four times a day (QID) | ORAL | Status: DC | PRN

## 2019-11-14 MED ORDER — CYCLOBENZAPRINE HCL 5 MG PO TABS
5.0000 mg | ORAL_TABLET | Freq: Three times a day (TID) | ORAL | Status: DC
  Administered 2019-11-14 – 2019-11-15 (×3): 5 mg via ORAL
  Filled 2019-11-14 (×3): qty 1

## 2019-11-14 MED ORDER — PANTOPRAZOLE SODIUM 40 MG IV SOLR
40.0000 mg | Freq: Every day | INTRAVENOUS | Status: DC
  Administered 2019-11-14: 40 mg via INTRAVENOUS
  Filled 2019-11-14: qty 40

## 2019-11-14 MED ORDER — LISINOPRIL-HYDROCHLOROTHIAZIDE 20-25 MG PO TABS: 1.0000 | ORAL_TABLET | Freq: Every day | ORAL | Status: DC

## 2019-11-14 MED ORDER — THROMBIN 20000 UNITS EX SOLR
CUTANEOUS | Status: DC | PRN
  Administered 2019-11-14: 20000 [IU] via TOPICAL

## 2019-11-14 MED ORDER — SENNOSIDES-DOCUSATE SODIUM 8.6-50 MG PO TABS: 1.0000 | ORAL_TABLET | Freq: Every evening | ORAL | Status: DC | PRN

## 2019-11-14 MED ORDER — TAPENTADOL HCL ER 150 MG PO TB12: 150.0000 mg | ORAL_TABLET | Freq: Two times a day (BID) | ORAL | Status: DC

## 2019-11-14 MED ORDER — PHENOL 1.4 % MT LIQD: 1.0000 | OROMUCOSAL | Status: DC | PRN

## 2019-11-14 MED ORDER — SODIUM CHLORIDE 0.9% FLUSH
3.0000 mL | Freq: Two times a day (BID) | INTRAVENOUS | Status: DC
  Administered 2019-11-14 (×2): 3 mL via INTRAVENOUS

## 2019-11-14 MED ORDER — LACTATED RINGERS IV SOLN: INTRAVENOUS | Status: DC | PRN

## 2019-11-14 MED ORDER — ONDANSETRON HCL 4 MG/2ML IJ SOLN: 4.0000 mg | Freq: Four times a day (QID) | INTRAMUSCULAR | Status: DC | PRN

## 2019-11-14 MED ORDER — VITAMIN B-12 1000 MCG PO TABS
1000.0000 ug | ORAL_TABLET | Freq: Every day | ORAL | Status: DC
  Administered 2019-11-14 – 2019-11-15 (×2): 1000 ug via ORAL
  Filled 2019-11-14 (×2): qty 1

## 2019-11-14 MED ORDER — VANCOMYCIN HCL IN DEXTROSE 1-5 GM/200ML-% IV SOLN
1000.0000 mg | Freq: Once | INTRAVENOUS | Status: AC
  Administered 2019-11-14: 1000 mg via INTRAVENOUS
  Filled 2019-11-14: qty 200

## 2019-11-14 MED ORDER — PHENYLEPHRINE HCL-NACL 20-0.9 MG/250ML-% IV SOLN
INTRAVENOUS | Status: DC | PRN
  Administered 2019-11-14: 50 ug/min via INTRAVENOUS

## 2019-11-14 MED ORDER — FENTANYL CITRATE (PF) 250 MCG/5ML IJ SOLN
INTRAMUSCULAR | Status: AC
  Filled 2019-11-14: qty 5

## 2019-11-14 MED ORDER — ALBUMIN HUMAN 5 % IV SOLN: INTRAVENOUS | Status: DC | PRN

## 2019-11-14 MED ORDER — CHLORHEXIDINE GLUCONATE 0.12 % MT SOLN: 15.0000 mL | Freq: Once | OROMUCOSAL | Status: AC

## 2019-11-14 MED ORDER — THROMBIN 20000 UNITS EX SOLR
CUTANEOUS | Status: AC
  Filled 2019-11-14: qty 20000

## 2019-11-14 MED ORDER — VANCOMYCIN HCL IN DEXTROSE 1-5 GM/200ML-% IV SOLN
INTRAVENOUS | Status: AC
  Administered 2019-11-14: 1000 mg via INTRAVENOUS
  Filled 2019-11-14: qty 200

## 2019-11-14 MED ORDER — BACITRACIN ZINC 500 UNIT/GM EX OINT
TOPICAL_OINTMENT | CUTANEOUS | Status: AC
  Filled 2019-11-14: qty 28.35

## 2019-11-14 MED ORDER — SODIUM CHLORIDE 0.9% FLUSH: 3.0000 mL | INTRAVENOUS | Status: DC | PRN

## 2019-11-14 MED ORDER — SCOPOLAMINE 1 MG/3DAYS TD PT72
1.0000 | MEDICATED_PATCH | Freq: Once | TRANSDERMAL | Status: DC
  Administered 2019-11-14: 1.5 mg via TRANSDERMAL
  Filled 2019-11-14: qty 1

## 2019-11-14 MED ORDER — ROCURONIUM BROMIDE 10 MG/ML (PF) SYRINGE
PREFILLED_SYRINGE | INTRAVENOUS | Status: DC | PRN
  Administered 2019-11-14: 20 mg via INTRAVENOUS
  Administered 2019-11-14: 50 mg via INTRAVENOUS

## 2019-11-14 MED ORDER — POVIDONE-IODINE 7.5 % EX SOLN
Freq: Once | CUTANEOUS | Status: DC
  Filled 2019-11-14: qty 118

## 2019-11-14 MED ORDER — PROPOFOL 500 MG/50ML IV EMUL
INTRAVENOUS | Status: DC | PRN
  Administered 2019-11-14: 50 ug/kg/min via INTRAVENOUS

## 2019-11-14 MED ORDER — METOPROLOL TARTRATE 100 MG PO TABS
100.0000 mg | ORAL_TABLET | Freq: Two times a day (BID) | ORAL | Status: DC
  Administered 2019-11-14 – 2019-11-15 (×2): 100 mg via ORAL
  Filled 2019-11-14: qty 4
  Filled 2019-11-14: qty 1
  Filled 2019-11-14: qty 4
  Filled 2019-11-14 (×2): qty 1

## 2019-11-14 MED ORDER — EPHEDRINE SULFATE 50 MG/ML IJ SOLN
INTRAMUSCULAR | Status: DC | PRN
  Administered 2019-11-14: 15 mg via INTRAVENOUS

## 2019-11-14 MED ORDER — ACETAMINOPHEN 325 MG PO TABS: 650.0000 mg | ORAL_TABLET | ORAL | Status: DC | PRN

## 2019-11-14 SURGICAL SUPPLY — 84 items
BENZOIN TINCTURE PRP APPL 2/3 (GAUZE/BANDAGES/DRESSINGS) ×3 IMPLANT
BIT DRILL MOUNTAINER FXED 12MM (DRILL) ×1 IMPLANT
BLADE CLIPPER SURG NEURO (BLADE) IMPLANT
BONE VIVIGEN FORMABLE 1.3CC (Bone Implant) ×3 IMPLANT
BUR NEURO DRILL SOFT 3.0X3.8M (BURR) ×3 IMPLANT
BUR PRESCISION 1.7 ELITE (BURR) ×3 IMPLANT
CLOSURE STERI-STRIP 1/2X4 (GAUZE/BANDAGES/DRESSINGS) ×1
CLOSURE WOUND 1/2 X4 (GAUZE/BANDAGES/DRESSINGS)
CLSR STERI-STRIP ANTIMIC 1/2X4 (GAUZE/BANDAGES/DRESSINGS) ×2 IMPLANT
CNTNR URN SCR LID CUP LEK RST (MISCELLANEOUS) ×1 IMPLANT
CONT SPEC 4OZ STRL OR WHT (MISCELLANEOUS) ×3
CORD BIPOLAR FORCEPS 12FT (ELECTRODE) ×3 IMPLANT
COVER BACK TABLE 80X110 HD (DRAPES) ×3 IMPLANT
COVER SURGICAL LIGHT HANDLE (MISCELLANEOUS) ×3 IMPLANT
COVER WAND RF STERILE (DRAPES) ×3 IMPLANT
DRAIN CHANNEL 10F 3/8 F FF (DRAIN) IMPLANT
DRAIN CHANNEL 15F RND FF W/TCR (WOUND CARE) IMPLANT
DRAIN HEMOVAC 1/8 X 5 (WOUND CARE) IMPLANT
DRAPE C-ARM 42X72 X-RAY (DRAPES) IMPLANT
DRAPE HALF SHEET 40X57 (DRAPES) ×15 IMPLANT
DRAPE INCISE IOBAN 66X45 STRL (DRAPES) ×3 IMPLANT
DRAPE LAPAROTOMY 100X72 PEDS (DRAPES) ×3 IMPLANT
DRAPE POUCH INSTRU U-SHP 10X18 (DRAPES) ×3 IMPLANT
DRAPE SURG 17X23 STRL (DRAPES) ×24 IMPLANT
DRILL MOUNTAINEER FIXED 12MM (DRILL) ×3
DRSG MEPILEX BORDER 4X8 (GAUZE/BANDAGES/DRESSINGS) ×3 IMPLANT
DURAPREP 26ML APPLICATOR (WOUND CARE) ×3 IMPLANT
ELECT BLADE 4.0 EZ CLEAN MEGAD (MISCELLANEOUS) ×3
ELECT CAUTERY BLADE 6.4 (BLADE) ×3 IMPLANT
ELECT REM PT RETURN 9FT ADLT (ELECTROSURGICAL) ×3
ELECTRODE BLDE 4.0 EZ CLN MEGD (MISCELLANEOUS) ×1 IMPLANT
ELECTRODE REM PT RTRN 9FT ADLT (ELECTROSURGICAL) ×1 IMPLANT
EVACUATOR SILICONE 100CC (DRAIN) IMPLANT
GAUZE 4X4 16PLY RFD (DISPOSABLE) ×12 IMPLANT
GAUZE SPONGE 4X4 12PLY STRL (GAUZE/BANDAGES/DRESSINGS) ×3 IMPLANT
GLOVE BIO SURGEON STRL SZ7 (GLOVE) ×3 IMPLANT
GLOVE BIO SURGEON STRL SZ8 (GLOVE) ×3 IMPLANT
GLOVE BIOGEL PI IND STRL 7.5 (GLOVE) ×1 IMPLANT
GLOVE BIOGEL PI IND STRL 8 (GLOVE) ×1 IMPLANT
GLOVE BIOGEL PI INDICATOR 7.5 (GLOVE) ×2
GLOVE BIOGEL PI INDICATOR 8 (GLOVE) ×2
GOWN STRL REUS W/ TWL LRG LVL3 (GOWN DISPOSABLE) ×4 IMPLANT
GOWN STRL REUS W/ TWL XL LVL3 (GOWN DISPOSABLE) ×1 IMPLANT
GOWN STRL REUS W/TWL LRG LVL3 (GOWN DISPOSABLE) ×12
GOWN STRL REUS W/TWL XL LVL3 (GOWN DISPOSABLE) ×3
IV CATH 14GX2 1/4 (CATHETERS) ×3 IMPLANT
KIT BASIN OR (CUSTOM PROCEDURE TRAY) ×3 IMPLANT
KIT TURNOVER KIT B (KITS) ×3 IMPLANT
NEEDLE HYPO 25GX1X1/2 BEV (NEEDLE) ×3 IMPLANT
NEEDLE PRECISIONGLIDE 27X1.5 (NEEDLE) ×3 IMPLANT
NS IRRIG 1000ML POUR BTL (IV SOLUTION) ×3 IMPLANT
PACK LAMINECTOMY ORTHO (CUSTOM PROCEDURE TRAY) ×3 IMPLANT
PACK UNIVERSAL I (CUSTOM PROCEDURE TRAY) ×3 IMPLANT
PAD ARMBOARD 7.5X6 YLW CONV (MISCELLANEOUS) ×6 IMPLANT
PATTIES SURGICAL .5 X.5 (GAUZE/BANDAGES/DRESSINGS) ×3 IMPLANT
PATTIES SURGICAL .5 X3 (DISPOSABLE) IMPLANT
PATTIES SURGICAL .5X1.5 (GAUZE/BANDAGES/DRESSINGS) ×3 IMPLANT
PIN MAYFIELD SKULL DISP (PIN) ×3 IMPLANT
PUTTY DBX 1CC (Putty) ×3 IMPLANT
PUTTY DBX 1CC DEPUY (Putty) ×1 IMPLANT
ROD LORD TI 3.5X50 (Rod) ×6 IMPLANT
SCREW F A 3.5X12 (Screw) ×12 IMPLANT
SCREW INNER (Screw) ×18 IMPLANT
SCREW MTN POLY 4.0X22MM (Screw) ×6 IMPLANT
SPONGE INTESTINAL PEANUT (DISPOSABLE) IMPLANT
SPONGE SURGIFOAM ABS GEL 100 (HEMOSTASIS) ×3 IMPLANT
STRIP CLOSURE SKIN 1/2X4 (GAUZE/BANDAGES/DRESSINGS) IMPLANT
SURGIFLO W/THROMBIN 8M KIT (HEMOSTASIS) IMPLANT
SUT MNCRL AB 4-0 PS2 18 (SUTURE) ×3 IMPLANT
SUT VIC AB 0 CT1 18XCR BRD 8 (SUTURE) ×1 IMPLANT
SUT VIC AB 0 CT1 8-18 (SUTURE) ×3
SUT VIC AB 1 CT1 18XCR BRD 8 (SUTURE) ×2 IMPLANT
SUT VIC AB 1 CT1 8-18 (SUTURE) ×6
SUT VIC AB 2-0 CT2 18 VCP726D (SUTURE) ×3 IMPLANT
SYR BULB IRRIG 60ML STRL (SYRINGE) ×3 IMPLANT
SYR CONTROL 10ML LL (SYRINGE) ×3 IMPLANT
TAP SURG 3.5 MOUNTAINEER (BIT) ×3 IMPLANT
TAPE CLOTH 4X10 WHT NS (GAUZE/BANDAGES/DRESSINGS) ×3 IMPLANT
TAPE CLOTH SURG 4X10 WHT LF (GAUZE/BANDAGES/DRESSINGS) ×3 IMPLANT
TOWEL GREEN STERILE (TOWEL DISPOSABLE) ×3 IMPLANT
TOWEL GREEN STERILE FF (TOWEL DISPOSABLE) ×3 IMPLANT
TRAY FOLEY MTR SLVR 16FR STAT (SET/KITS/TRAYS/PACK) ×3 IMPLANT
WATER STERILE IRR 1000ML POUR (IV SOLUTION) ×3 IMPLANT
YANKAUER SUCT BULB TIP NO VENT (SUCTIONS) ×3 IMPLANT

## 2019-11-14 NOTE — H&P (Signed)
PREOPERATIVE H&P  Chief Complaint: Neck pain  HPI: Holly Bowen is a 64 y.o. female who presents with ongoing pain in the neck  CT reveals a cervical nonunion  Patient has failed multiple forms of conservative care and continues to have pain (see office notes for additional details regarding the patient's full course of treatment)  Past Medical History:  Diagnosis Date  . Anxiety   . Depression   . Dyspnea    hx clot in lung, SOB with exertion  . Hypertension   . IBS (irritable bowel syndrome)   . PONV (postoperative nausea and vomiting)   . 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 Restrictive lung disease    Past Surgical History:  Procedure Laterality Date  . ABDOMINAL HYSTERECTOMY    . BREAST BIOPSY Right    neg  . CERVICAL FUSION  2017  . COLONOSCOPY WITH PROPOFOL N/A 02/22/2018   Procedure: COLONOSCOPY WITH PROPOFOL;  Surgeon: Toledo, Benay Pike, MD;  Location: [facility];  Service: Gastroenterology;  Laterality: N/A;  . ESOPHAGOGASTRODUODENOSCOPY    . ESOPHAGOGASTRODUODENOSCOPY N/A 02/22/2018   Procedure: ESOPHAGOGASTRODUODENOSCOPY (EGD);  Surgeon: Toledo, Benay Pike, MD;  Location: [facility]COPY;  Service: Gastroenterology;  Laterality: N/A;  . spleen removed  2015   Social History   Socioeconomic History  . Marital status: Single    Spouse name: Not on file  . Number of children: Not on file  . Years of education: Not on file  . Highest education level: Not on file  Occupational History  . Not on file  Tobacco Use  . Smoking status: Never Smoker  . Smokeless tobacco: Never Used  Vaping Use  . Vaping Use: Never used  Substance and Sexual Activity  . Alcohol use: No  . Drug use: No  . Sexual activity: Not on file    Comment: Hysterectomy  Other Topics Concern  . Not on file  Social History Narrative  . Not on file   Social Determinants of Health   Financial Resource Strain:   . Difficulty of Paying Living Expenses:   Food Insecurity:     . Worried About Charity fundraiser in the Last Year:   . Arboriculturist in the Last Year:   Transportation Needs:   . Film/video editor (Medical):   Marland Kitchen Lack of Transportation (Non-Medical):   Physical Activity:   . Days of Exercise per Week:   . Minutes of Exercise per Session:   Stress:   . Feeling of Stress :   Social Connections:   . Frequency of Communication with Friends and Family:   . Frequency of Social Gatherings with Friends and Family:   . Attends Religious Services:   . Active Member of Clubs or Organizations:   . Attends Archivist Meetings:   Marland Kitchen Marital Status:    Family History  Problem Relation Age of Onset  . Breast cancer Neg Hx    Allergies  Allergen Reactions  . Penicillins Anaphylaxis    Tongue and throat swelling   Prior to Admission medications   Medication Sig Start Date End Date Taking? Authorizing Provider  acetaminophen (TYLENOL) 325 MG tablet Take 650 mg by mouth every 6 (six) hours as needed for moderate pain.  04/12/16  Yes [provider]  busPIRone (BUSPAR) 30 MG tablet Take 30 mg by mouth daily.    Yes [provider]  Cyanocobalamin 1000 MCG/ML LIQD Take 1 drop by mouth daily.  02/15/14  Yes  [provider]  DULoxetine (CYMBALTA) 60 MG capsule Take 60 mg by mouth daily. 04/03/19 04/13/20 Yes [provider]  lisinopril-hydrochlorothiazide (ZESTORETIC) 20-25 MG tablet Take 1 tablet by mouth daily.   Yes [provider]  metoprolol tartrate (LOPRESSOR) 100 MG tablet Take 100 mg by mouth 2 (two) times daily.  02/14/14  Yes [provider]  risperiDONE (RISPERDAL) 0.25 MG tablet Take 0.5 mg by mouth at bedtime.  02/26/14 10/31/19 Yes [provider]  rivaroxaban (XARELTO) 20 MG TABS tablet Take 20 mg by mouth daily with supper.  05/07/16  Yes [provider]  Tapentadol HCl 150 MG TB12 Take 150 mg by mouth every 12 (twelve) hours as needed. 03/22/19 01/07/20 Yes [provider]  ondansetron (ZOFRAN-ODT) 4 MG disintegrating tablet Take 4 mg by mouth every 8 (eight) hours as needed for nausea/vomiting. 10/30/19   [provider]     All other systems have been reviewed and were otherwise negative with the exception of those mentioned in the HPI and as above.  Physical Exam: Vitals:   11/14/19 0657  BP: (!) 169/77  Pulse: 62  Resp: 18  Temp: 98 F (36.7 C)  SpO2: 97%    Body mass index is 29.12 kg/m.  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

## 2019-11-14 NOTE — Progress Notes (Signed)
Pharmacy Antibiotic Note  Holly Bowen is a 64 y.o. female admitted on 11/14/2019 with surgical prophylaxis.  Pharmacy has been consulted for 12 hour post-op vancomyinc dosing due to hx of anaphylaxis to penicillins.  Confirmed no drain placed per op note and RN. ClCr > 60 ml/min. Will order 1 time vancomycin 12 hours after pre-op dose.   Plan: Vancomycin 1g once at 20:00    Height: 5\' 5"  (165.1 cm) Weight: 79.4 kg (175 lb) IBW/kg (Calculated) : 57  Temp (24hrs), Avg:97.9 F (36.6 C), Min:97.4 F (36.3 C), Max:98.7 F (37.1 C)  No results for input(s): WBC, CREATININE, LATICACIDVEN, VANCOTROUGH, VANCOPEAK, VANCORANDOM, GENTTROUGH, GENTPEAK, GENTRANDOM, TOBRATROUGH, TOBRAPEAK, TOBRARND, AMIKACINPEAK, AMIKACINTROU, AMIKACIN in the last 168 hours.  Estimated Creatinine Clearance: 72.1 mL/min (by C-G formula based on SCr of 0.52 mg/dL).    Allergies  Allergen Reactions  . Penicillins Anaphylaxis    Tongue and throat swelling     Thank you for allowing pharmacy to be a part of this patient's care.  Benetta Spar, PharmD, BCPS, BCCP Clinical Pharmacist  Please check AMION for all [place] phone numbers After 10:00 PM, call [place] [phone]

## 2019-11-14 NOTE — Transfer of Care (Signed)
Immediate Anesthesia Transfer of Care Note  Patient: Holly Bowen  Procedure(s) Performed: CERVICAL 4 - CERVICAL 7 POSTERIOR SPINAL FUSION WITH INSTRUMENTATION AND ALLOGRAFT (N/A Spine Cervical)  Patient Location: PACU  Anesthesia Type:General  Level of Consciousness: drowsy and patient cooperative  Airway & Oxygen Therapy: Patient Spontanous Breathing  Post-op Assessment: Report given to RN and Post -op Vital signs reviewed and stable  Post vital signs: Reviewed and stable  Last Vitals:  Vitals Value Taken Time  BP 163/81 11/14/19 1212  Temp    Pulse 88 11/14/19 1213  Resp 16 11/14/19 1213  SpO2 96 % 11/14/19 1213  Vitals shown include unvalidated device data.  Last Pain:  Vitals:   11/14/19 0802  TempSrc:   PainSc: 0-No pain      Patients Stated Pain Goal: 3 (03/16/50 0258)  Complications: No complications documented.

## 2019-11-14 NOTE — Op Note (Signed)
PATIENT NAME: Holly Bowen   MEDICAL RECORD NO.:   [id number]    DATE OF BIRTH: [birth year]   DATE OF PROCEDURE: 11/14/2019  OPERATIVE REPORT   PREOPERATIVE DIAGNOSES:   1.  Status post previous anterior cervical fusion x 2, spanning C4-C7 2.  Nonunion, C4-5, C5-6  POSTOPERATIVE DIAGNOSES:   1.  Status post previous anterior cervical fusion 2.  Nonunion, C5-C6.  PROCEDURE PERFORMED: 1.  Posterior spinal fusion C4-C5, C5-C6, C6-7 2.  Placement of posterior segmental instrumentation bilaterally, C4, C5, C7. 3.  Bilateral C6-7 laminotomy with partial facetectomy 4.  Use of local autograft. 5.  Use of morselized allograft - ViviGen and DBX putty.   6.  Cranial tong application and removal.  SURGEON:  Phylliss Bob, MD.  ASSISTANTLonn Georgia McKenzie PA-C.  ANESTHESIA:  General endotracheal anesthesia.  COMPLICATIONS:  None.  DISPOSITION:  Stable.  ESTIMATED BLOOD LOSS:  Minimal.  INDICATIONS FOR SURGERY:  Briefly, the Holly Bowen is a 64 year old female who did sustain a work injury on 10/13/2009, when she slipped and fell.  Subsequently, she did have an ACDF at C5-6 and C6-7 in August of 2012.  This was followed by a C4-5 ACDF in September of 2017.  I did evaluate her initially on 08/17/2019, as part of an independent medical evaluation.  She did have substantial pain in her neck.  At the time of my evaluation, I did recommend a CAT scan of her cervical spine, to rule out a nonunion, as this was a potential source of her neck pain.  A CAT scan was ultimately obtained, clearly revealing a nonunion at C4-5 and C5-6. Given her nonunion, I did feel that this was a likely source of her neck pain.  Her and I therefore did have a discussion regarding proceeding with a posterior fusion procedure, spanning C4-C7.  She did wish to proceed. The patient was fully aware of the risks and limitations of surgery and did wish to proceed.  DESCRIPTION OF PROCEDURE:  On 11/14/2019, the patient was brought to  surgery and general endotracheal anesthesia was administered.   A Mayfield head holder was applied by me and the patient was rolled prone.  Gel rolls were placed under the patient's chest and abdominal area.  The patient's neck was appropriately positioned.  The patient's arms were secured to her sides and all bony prominences were padded.  The neck was then prepped and draped in the usual sterile fashion.  A timeout procedure was then  performed.  An incision was then made overlying the midline.  The lamina of C4, C5, C6, and C7 was identified and subperiosteally exposed.  The facet joints at C4-C5, C5-C6, and C6-7 were identified and decorticated bilaterally using a high speed bur.  Then, using anatomic  landmarks, I did use a 1.7 mm bur to prepare the entry point for the lateral mass screws at C4 and C5 bilaterally. A 3 mm tap was used to cannulate the lateral masses.  A ball-tipped probe was used to confirm that there is no cortical violation.  At this point, I performed bilateral partial facetectomies and foraminotomies at C6-7.  This did allow me to fully decompress the exiting bilateral C7 nerves, and did also allow me to use a nerve hook to palpate the medial and superior border of the C7 pedicles.  At this point, a 1.7 mm bur was used to gain entry to the bilateral C7 pedicles.  I then used a gearshift probe followed by a 3.5 mm  tap.  The tap was advanced to 22 mm.  A ball-tipped probe was used to confirm that there is no cortical violation. At this point, a 3 mm bur was used to decorticate the posterior elements at C4, C5, C6, and C7.  Subsequent to this, 12 x 3.5 mm screws were placed into the lateral masses of C4 and C5 bilaterally.  50 mm rods were then placed into the tulip heads of the screws spanning C4-C7.  Caps were placed and a final locking procedure was performed.  I then brought in AP and lateral fluoroscopy, and was very pleased with the final construct noted. The wound at this point was  copiously irrigated with a total of 2 L of normal saline.  I then packed the facet joints bilaterally at C4-5, C5-6, and C6-7 using DBX putty.  I  then placed Vivigen along the posterior elements a banding C4-C7 bilaterally.  The wound at this point was closed in layers using #1 Vicryl followed by 2-0 Vicryl followed by 4-0 Monocryl.  Benzoin and Steri-Strips were applied followed by sterile dressing.  The patient was then rolled supine and  the Mayfield head holder was removed.  The patient was then awoken from general endotracheal anesthesia and transferred to recovery in stable condition.  All instrument counts were correct at the termination of the procedure.  Of note, Pricilla Holm, PA-C was my assistant throughout surgery, and did aid in retraction, suctioning, placement of the hardware, and closure.  Phylliss Bob, MD

## 2019-11-14 NOTE — Anesthesia Procedure Notes (Signed)
Procedure Name: Intubation Date/Time: 11/14/2019 8:55 AM Performed by: Eligha Bridegroom, CRNA Pre-anesthesia Checklist: Patient identified, Emergency Drugs available, Suction available, Patient being monitored and Timeout performed Patient Re-evaluated:Patient Re-evaluated prior to induction Oxygen Delivery Method: Circle system utilized Preoxygenation: Pre-oxygenation with 100% oxygen Induction Type: IV induction Ventilation: Mask ventilation without difficulty and Oral airway inserted - appropriate to patient size Laryngoscope Size: Glidescope Grade View: Grade II Tube size: 7.0 mm Airway Equipment and Method: Video-laryngoscopy and Stylet Placement Confirmation: ETT inserted through vocal cords under direct vision,  positive ETCO2 and breath sounds checked- equal and bilateral Secured at: 21 cm Tube secured with: Tape Dental Injury: Teeth and Oropharynx as per pre-operative assessment

## 2019-11-14 NOTE — Anesthesia Postprocedure Evaluation (Signed)
Anesthesia Post Note  Patient: Holly Bowen  Procedure(s) Performed: CERVICAL 4 - CERVICAL 7 POSTERIOR SPINAL FUSION WITH INSTRUMENTATION AND ALLOGRAFT (N/A Spine Cervical)     Patient location during evaluation: PACU Anesthesia Type: General Level of consciousness: awake and alert Pain management: pain level controlled Vital Signs Assessment: post-procedure vital signs reviewed and stable Respiratory status: spontaneous breathing, nonlabored ventilation, respiratory function stable and patient connected to nasal cannula oxygen Cardiovascular status: blood pressure returned to baseline and stable Postop Assessment: no apparent nausea or vomiting Anesthetic complications: no   No complications documented.  Last Vitals:  Vitals:   11/14/19 1315 11/14/19 1339  BP: (!) 154/76 (!) 149/80  Pulse: 90 90  Resp: 18 18  Temp: (!) 36.3 C 37.1 C  SpO2: 98% 94%    Last Pain:  Vitals:   11/14/19 1339  TempSrc: Oral  PainSc:                  Catalina Gravel

## 2019-11-15 ENCOUNTER — Encounter (HOSPITAL_COMMUNITY): Payer: Self-pay | Admitting: Orthopedic Surgery

## 2019-11-15 DIAGNOSIS — M960 Pseudarthrosis after fusion or arthrodesis: Secondary | ICD-10-CM | POA: Diagnosis not present

## 2019-11-15 MED ORDER — OXYCODONE HCL ER 10 MG PO T12A: 10.0000 mg | EXTENDED_RELEASE_TABLET | Freq: Two times a day (BID) | ORAL | 0 refills | Status: DC

## 2019-11-15 MED ORDER — OXYCODONE-ACETAMINOPHEN 5-325 MG PO TABS: 1.0000 | ORAL_TABLET | ORAL | 0 refills | Status: DC | PRN

## 2019-11-15 MED ORDER — CYCLOBENZAPRINE HCL 5 MG PO TABS: 5.0000 mg | ORAL_TABLET | Freq: Three times a day (TID) | ORAL | 0 refills | Status: DC

## 2019-11-15 NOTE — Progress Notes (Signed)
    Patient doing well  Denies arm pain Tolerating PO well   Physical Exam: Vitals:   11/15/19 0325 11/15/19 0616  BP: (!) 163/77   Pulse: 92   Resp: 18   Temp: 99.3 F (37.4 C) 99.3 F (37.4 C)  SpO2: 90%     Neck soft/supple Dressing in place NVI  POD #1 s/p C4-7 PCF, doing well  - encourage ambulation - Percocet/Oxycontin for pain, Robaxin for muscle spasms - d/c home today with f/u in 2 weeks - pain meds to be overseen by pain management postop, however I will prescribe oxycontin and percocet for now, and I did instruct patient to follow-up with pain management for additional oversight of pain meds

## 2019-11-15 NOTE — Progress Notes (Signed)
Patient is discharged from room 3C04 at this time. Alert and in stable condition. IV site d/c'd an instructions read to patient and niece with understanding verbalized and all questions answered. Left unit via wheelchair with all belongings at side.

## 2019-11-16 NOTE — Discharge Summary (Signed)
Patient ID: Holly Bowen MRN: [id number] DOB/AGE: 12/25/1955 64 y.o.  Admit date: 11/14/2019 Discharge date: 11/15/2019  Admission Diagnoses:  Active Problems:   Pseudoarthrosis of cervical spine [facility])   Discharge Diagnoses:  Same  Past Medical History:  Diagnosis Date  . Anxiety   . Depression   . Dyspnea    hx clot in lung, SOB with exertion  . Hypertension   . IBS (irritable bowel syndrome)   . PONV (postoperative nausea and vomiting)   . 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 Restrictive lung disease     Surgeries: Procedure(s): CERVICAL 4 - CERVICAL 7 POSTERIOR SPINAL FUSION WITH INSTRUMENTATION AND ALLOGRAFT on 11/14/2019   Consultants: None  Discharged Condition: Improved  Hospital Course: Holly Bowen is an 64 y.o. female who was admitted 11/14/2019 for operative treatment of pseudoarthrosis. Patient has severe unremitting pain that affects sleep, daily activities, and work/hobbies. After pre-op clearance the patient was taken to the operating room on 11/14/2019 and underwent  Procedure(s): CERVICAL 4 - CERVICAL 7 POSTERIOR SPINAL FUSION WITH INSTRUMENTATION AND ALLOGRAFT.    Patient was given perioperative antibiotics:  Anti-infectives (From admission, onward)   Start     Dose/Rate Route Frequency Ordered Stop   11/14/19 2000  vancomycin (VANCOCIN) IVPB 1000 mg/200 mL premix        1,000 mg 200 mL/hr over 60 Minutes Intravenous  Once 11/14/19 1454 11/14/19 2145   11/14/19 0730  vancomycin (VANCOCIN) IVPB 1000 mg/200 mL premix        1,000 mg 200 mL/hr over 60 Minutes Intravenous On call to O.R. 11/14/19 0355 11/14/19 0850       Patient was given sequential compression devices, early ambulation to prevent DVT.  Patient benefited maximally from hospital stay and there were no complications.    Recent vital signs:  Patient Vitals for the past 24 hrs:  BP Temp Temp src Pulse Resp SpO2  11/15/19 0746 (!) 147/65 98.9 F (37.2 C) Oral (!) 101 16 92 %      Discharge Medications:   Allergies as of 11/15/2019      Reactions   Penicillins Anaphylaxis   Tongue and throat swelling      Medication List    TAKE these medications   acetaminophen 325 MG tablet Commonly known as: TYLENOL Take 650 mg by mouth every 6 (six) hours as needed for moderate pain.   busPIRone 30 MG tablet Commonly known as: BUSPAR Take 30 mg by mouth daily.   Cyanocobalamin 1000 MCG/ML Liqd Take 1 drop by mouth daily.   cyclobenzaprine 5 MG tablet Commonly known as: FLEXERIL Take 1 tablet (5 mg total) by mouth 3 (three) times daily. Notes to patient: **NEW** To treat muscle spasms. May cause drowsiness, dizziness and constipation.    DULoxetine 60 MG capsule Commonly known as: CYMBALTA Take 60 mg by mouth daily.   lisinopril-hydrochlorothiazide 20-25 MG tablet Commonly known as: ZESTORETIC Take 1 tablet by mouth daily.   metoprolol tartrate 100 MG tablet Commonly known as: LOPRESSOR Take 100 mg by mouth 2 (two) times daily.   ondansetron 4 MG disintegrating tablet Commonly known as: ZOFRAN-ODT Take 4 mg by mouth every 8 (eight) hours as needed for nausea/vomiting.   oxyCODONE 10 mg 12 hr tablet Commonly known as: OXYCONTIN Take 1 tablet (10 mg total) by mouth every 12 (twelve) hours. Notes to patient: **NEW** Long-acting opiate to treat severe pain. Watch closely for cause drowsiness, dizziness and constipation.    oxyCODONE-acetaminophen 5-325 MG tablet Commonly  known as: PERCOCET/ROXICET Take 1-2 tablets by mouth every 4 (four) hours as needed for severe pain. Notes to patient: **NEW** Shorter-acting opiate to treat severe pain. Use sparingly with other medications that also cause drowsiness, dizziness and constipation.    risperiDONE 0.25 MG tablet Commonly known as: RISPERDAL Take 0.5 mg by mouth at bedtime.   Tapentadol HCl 150 MG Tb12 Take 150 mg by mouth every 12 (twelve) hours as needed.       Diagnostic Studies: DG Chest 2  View  Result Date: 11/07/2019 CLINICAL DATA:  Pre-admission evaluation for cervical surgery EXAM: CHEST - 2 VIEW COMPARISON:  01/11/2011 FINDINGS: Frontal and lateral views of the chest demonstrate an unremarkable cardiac silhouette. There is chronic elevation of the right hemidiaphragm. Subsegmental atelectasis or scarring at the right lung base. No airspace disease, effusion, or pneumothorax. Postsurgical changes at the cervicothoracic junction. Stable embolic coils left upper quadrant. IMPRESSION: 1. No acute intrathoracic process. Electronically Signed   By: Randa Ngo M.D.   On: 11/07/2019 17:05   DG Cervical Spine 1 View  Result Date: 11/14/2019 CLINICAL DATA:  Localization imaging for subsequent fusion EXAM: DG CERVICAL SPINE - 1 VIEW COMPARISON:  June 30, 2016. FINDINGS: Cross-table lateral image obtained, film labeled 9:44:13. Metallic probe tip is posterior to the C5-6 interspace level. There is anterior screw and plate fixation at C4 and C5 with a second screw and plate fixation at C5, C6, and C7. Disc spacers are noted at C5-6 and C6-7. No fracture or spondylolisthesis. IMPRESSION: Metallic probe tip is posterior to the C5-6 interspace level. Postoperative change from C4-C7 with support hardware intact. No fracture or spondylolisthesis. Electronically Signed   By: Lowella Grip III M.D.   On: 11/14/2019 11:34   DG Cervical Spine 2 or 3 views  Result Date: 11/14/2019 CLINICAL DATA:  Posterior cervical fusion. EXAM: CERVICAL SPINE - 2-3 VIEW; DG C-ARM 1-60 MIN COMPARISON:  Radiographs earlier today and MRI 12/12/2015. FINDINGS: C-arm fluoroscopy was provided in the operating room. 9 seconds of fluoroscopy time. 3 spot fluoroscopic images of the cervical spine are submitted. These images demonstrate previous anterior discectomy and fusion at C4-5 and at C5-7 with anterior screw plates and interbody spacers. These images demonstrate the sequential placement of posterior pedicle screws and  interconnecting rods at the C4, C5 and C7 levels. No complications identified. IMPRESSION: Intraoperative spot fluoroscopic views during posterior fusion from C4 through C7. No demonstrated complication. Electronically Signed   By: Richardean Sale M.D.   On: 11/14/2019 15:30   DG C-Arm 1-60 Min  Result Date: 11/14/2019 CLINICAL DATA:  Posterior cervical fusion. EXAM: CERVICAL SPINE - 2-3 VIEW; DG C-ARM 1-60 MIN COMPARISON:  Radiographs earlier today and MRI 12/12/2015. FINDINGS: C-arm fluoroscopy was provided in the operating room. 9 seconds of fluoroscopy time. 3 spot fluoroscopic images of the cervical spine are submitted. These images demonstrate previous anterior discectomy and fusion at C4-5 and at C5-7 with anterior screw plates and interbody spacers. These images demonstrate the sequential placement of posterior pedicle screws and interconnecting rods at the C4, C5 and C7 levels. No complications identified. IMPRESSION: Intraoperative spot fluoroscopic views during posterior fusion from C4 through C7. No demonstrated complication. Electronically Signed   By: Richardean Sale M.D.   On: 11/14/2019 15:30    Disposition: Discharge disposition: 01-Home or Self Care        POD #1 s/p C4-7 PCF, doing well  - encourage ambulation - Percocet/Oxycontin for pain, Robaxin for muscle spasms - pain  meds to be overseen by pain management postop, however I will prescribe oxycontin and percocet for now, and I did instruct patient to follow-up with pain management for additional oversight of pain meds -Scripts for pain sent to pharmacy electronically  -D/C instructions sheet printed and in chart -D/C today  -F/U in office 2 weeks   Signed: Justice Britain 11/16/2019, 7:41 AM

## 2019-11-20 NOTE — Progress Notes (Signed)
Pt attempted. Unable to reach.

## 2020-01-22 DIAGNOSIS — J984 Other disorders of lung: Secondary | ICD-10-CM | POA: Diagnosis not present

## 2020-01-22 DIAGNOSIS — I10 Essential (primary) hypertension: Secondary | ICD-10-CM | POA: Diagnosis not present

## 2020-01-22 DIAGNOSIS — Z86711 Personal history of pulmonary embolism: Secondary | ICD-10-CM | POA: Diagnosis not present

## 2020-01-22 DIAGNOSIS — I340 Nonrheumatic mitral (valve) insufficiency: Secondary | ICD-10-CM | POA: Diagnosis not present

## 2020-01-22 DIAGNOSIS — I493 Ventricular premature depolarization: Secondary | ICD-10-CM | POA: Diagnosis not present

## 2020-04-23 ENCOUNTER — Other Ambulatory Visit: Payer: Self-pay | Admitting: Family Medicine

## 2020-04-23 DIAGNOSIS — Z1231 Encounter for screening mammogram for malignant neoplasm of breast: Secondary | ICD-10-CM

## 2020-05-20 DIAGNOSIS — M1712 Unilateral primary osteoarthritis, left knee: Secondary | ICD-10-CM | POA: Diagnosis not present

## 2020-06-26 DIAGNOSIS — Z20822 Contact with and (suspected) exposure to covid-19: Secondary | ICD-10-CM | POA: Diagnosis not present

## 2020-07-17 DIAGNOSIS — J984 Other disorders of lung: Secondary | ICD-10-CM | POA: Diagnosis not present

## 2020-07-17 DIAGNOSIS — I340 Nonrheumatic mitral (valve) insufficiency: Secondary | ICD-10-CM | POA: Diagnosis not present

## 2020-07-17 DIAGNOSIS — I10 Essential (primary) hypertension: Secondary | ICD-10-CM | POA: Diagnosis not present

## 2020-07-17 DIAGNOSIS — I493 Ventricular premature depolarization: Secondary | ICD-10-CM | POA: Diagnosis not present

## 2020-07-17 DIAGNOSIS — Z86711 Personal history of pulmonary embolism: Secondary | ICD-10-CM | POA: Diagnosis not present

## 2020-07-17 DIAGNOSIS — R011 Cardiac murmur, unspecified: Secondary | ICD-10-CM | POA: Diagnosis not present

## 2020-08-21 ENCOUNTER — Other Ambulatory Visit: Payer: Self-pay | Admitting: Family Medicine

## 2020-08-21 DIAGNOSIS — Z1231 Encounter for screening mammogram for malignant neoplasm of breast: Secondary | ICD-10-CM

## 2020-08-26 DIAGNOSIS — I493 Ventricular premature depolarization: Secondary | ICD-10-CM | POA: Diagnosis not present

## 2020-08-26 DIAGNOSIS — I340 Nonrheumatic mitral (valve) insufficiency: Secondary | ICD-10-CM | POA: Diagnosis not present

## 2020-08-26 DIAGNOSIS — R011 Cardiac murmur, unspecified: Secondary | ICD-10-CM | POA: Diagnosis not present

## 2020-09-05 ENCOUNTER — Other Ambulatory Visit: Payer: Self-pay

## 2020-09-05 ENCOUNTER — Ambulatory Visit
Admission: RE | Admit: 2020-09-05 | Discharge: 2020-09-05 | Disposition: A | Discharge status: Home / Self Care | Payer: PPO | Source: Ambulatory Visit | Attending: Family Medicine | Admitting: Family Medicine

## 2020-09-05 DIAGNOSIS — Z1231 Encounter for screening mammogram for malignant neoplasm of breast: Secondary | ICD-10-CM | POA: Diagnosis not present

## 2020-09-11 ENCOUNTER — Other Ambulatory Visit: Payer: Self-pay | Admitting: Family Medicine

## 2020-09-11 DIAGNOSIS — N6489 Other specified disorders of breast: Secondary | ICD-10-CM

## 2020-09-11 DIAGNOSIS — R928 Other abnormal and inconclusive findings on diagnostic imaging of breast: Secondary | ICD-10-CM

## 2020-09-15 ENCOUNTER — Ambulatory Visit
Admission: RE | Admit: 2020-09-15 | Discharge: 2020-09-15 | Disposition: A | Discharge status: Home / Self Care | Payer: PPO | Source: Ambulatory Visit | Attending: Family Medicine | Admitting: Family Medicine

## 2020-09-15 ENCOUNTER — Other Ambulatory Visit: Payer: Self-pay

## 2020-09-15 DIAGNOSIS — R928 Other abnormal and inconclusive findings on diagnostic imaging of breast: Secondary | ICD-10-CM | POA: Diagnosis not present

## 2020-09-15 DIAGNOSIS — N6011 Diffuse cystic mastopathy of right breast: Secondary | ICD-10-CM | POA: Diagnosis not present

## 2020-09-15 DIAGNOSIS — N6489 Other specified disorders of breast: Secondary | ICD-10-CM

## 2020-09-18 ENCOUNTER — Other Ambulatory Visit: Payer: Self-pay | Admitting: Family Medicine

## 2020-09-18 DIAGNOSIS — N6310 Unspecified lump in the right breast, unspecified quadrant: Secondary | ICD-10-CM

## 2020-09-23 DIAGNOSIS — G894 Chronic pain syndrome: Secondary | ICD-10-CM | POA: Diagnosis not present

## 2020-09-23 DIAGNOSIS — R5383 Other fatigue: Secondary | ICD-10-CM | POA: Diagnosis not present

## 2020-09-23 DIAGNOSIS — R7309 Other abnormal glucose: Secondary | ICD-10-CM | POA: Diagnosis not present

## 2020-09-23 DIAGNOSIS — F419 Anxiety disorder, unspecified: Secondary | ICD-10-CM | POA: Diagnosis not present

## 2020-09-23 DIAGNOSIS — D75838 Other thrombocytosis: Secondary | ICD-10-CM | POA: Diagnosis not present

## 2020-09-23 DIAGNOSIS — J984 Other disorders of lung: Secondary | ICD-10-CM | POA: Diagnosis not present

## 2020-09-23 DIAGNOSIS — I10 Essential (primary) hypertension: Secondary | ICD-10-CM | POA: Diagnosis not present

## 2020-09-23 DIAGNOSIS — Z9081 Acquired absence of spleen: Secondary | ICD-10-CM | POA: Diagnosis not present

## 2020-09-23 DIAGNOSIS — Z0000 Encounter for general adult medical examination without abnormal findings: Secondary | ICD-10-CM | POA: Diagnosis not present

## 2020-09-24 DIAGNOSIS — Z79899 Other long term (current) drug therapy: Secondary | ICD-10-CM | POA: Diagnosis not present

## 2020-09-24 DIAGNOSIS — R5383 Other fatigue: Secondary | ICD-10-CM | POA: Diagnosis not present

## 2020-09-24 DIAGNOSIS — I10 Essential (primary) hypertension: Secondary | ICD-10-CM | POA: Diagnosis not present

## 2020-09-24 DIAGNOSIS — R7309 Other abnormal glucose: Secondary | ICD-10-CM | POA: Diagnosis not present

## 2021-02-16 DIAGNOSIS — R002 Palpitations: Secondary | ICD-10-CM | POA: Diagnosis not present

## 2021-02-16 DIAGNOSIS — I493 Ventricular premature depolarization: Secondary | ICD-10-CM | POA: Diagnosis not present

## 2021-02-16 DIAGNOSIS — I340 Nonrheumatic mitral (valve) insufficiency: Secondary | ICD-10-CM | POA: Diagnosis not present

## 2021-02-16 DIAGNOSIS — R011 Cardiac murmur, unspecified: Secondary | ICD-10-CM | POA: Diagnosis not present

## 2021-02-16 DIAGNOSIS — I10 Essential (primary) hypertension: Secondary | ICD-10-CM | POA: Diagnosis not present

## 2021-03-09 DIAGNOSIS — I493 Ventricular premature depolarization: Secondary | ICD-10-CM | POA: Diagnosis not present

## 2021-03-24 ENCOUNTER — Other Ambulatory Visit: Payer: Self-pay

## 2021-03-24 ENCOUNTER — Ambulatory Visit
Admission: RE | Admit: 2021-03-24 | Discharge: 2021-03-24 | Disposition: A | Discharge status: Home / Self Care | Payer: PPO | Source: Ambulatory Visit | Attending: Family Medicine | Admitting: Family Medicine

## 2021-03-24 DIAGNOSIS — R922 Inconclusive mammogram: Secondary | ICD-10-CM | POA: Diagnosis not present

## 2021-03-24 DIAGNOSIS — N6310 Unspecified lump in the right breast, unspecified quadrant: Secondary | ICD-10-CM

## 2021-03-24 DIAGNOSIS — N6315 Unspecified lump in the right breast, overlapping quadrants: Secondary | ICD-10-CM | POA: Insufficient documentation

## 2021-03-26 ENCOUNTER — Other Ambulatory Visit: Payer: Self-pay | Admitting: Family Medicine

## 2021-03-26 DIAGNOSIS — N6310 Unspecified lump in the right breast, unspecified quadrant: Secondary | ICD-10-CM

## 2021-03-30 DIAGNOSIS — Z86711 Personal history of pulmonary embolism: Secondary | ICD-10-CM | POA: Diagnosis not present

## 2021-03-30 DIAGNOSIS — R011 Cardiac murmur, unspecified: Secondary | ICD-10-CM | POA: Diagnosis not present

## 2021-03-30 DIAGNOSIS — I493 Ventricular premature depolarization: Secondary | ICD-10-CM | POA: Diagnosis not present

## 2021-03-30 DIAGNOSIS — I10 Essential (primary) hypertension: Secondary | ICD-10-CM | POA: Diagnosis not present

## 2021-03-30 DIAGNOSIS — I340 Nonrheumatic mitral (valve) insufficiency: Secondary | ICD-10-CM | POA: Diagnosis not present

## 2021-04-28 DIAGNOSIS — I10 Essential (primary) hypertension: Secondary | ICD-10-CM | POA: Diagnosis not present

## 2021-04-28 DIAGNOSIS — G894 Chronic pain syndrome: Secondary | ICD-10-CM | POA: Diagnosis not present

## 2021-04-28 DIAGNOSIS — F32A Depression, unspecified: Secondary | ICD-10-CM | POA: Diagnosis not present

## 2021-06-25 DIAGNOSIS — I340 Nonrheumatic mitral (valve) insufficiency: Secondary | ICD-10-CM | POA: Diagnosis not present

## 2021-06-25 DIAGNOSIS — J984 Other disorders of lung: Secondary | ICD-10-CM | POA: Diagnosis not present

## 2021-06-25 DIAGNOSIS — Z86711 Personal history of pulmonary embolism: Secondary | ICD-10-CM | POA: Diagnosis not present

## 2021-06-25 DIAGNOSIS — I10 Essential (primary) hypertension: Secondary | ICD-10-CM | POA: Diagnosis not present

## 2021-06-25 DIAGNOSIS — I493 Ventricular premature depolarization: Secondary | ICD-10-CM | POA: Diagnosis not present

## 2021-06-25 DIAGNOSIS — R011 Cardiac murmur, unspecified: Secondary | ICD-10-CM | POA: Diagnosis not present

## 2021-09-24 DIAGNOSIS — G894 Chronic pain syndrome: Secondary | ICD-10-CM | POA: Diagnosis not present

## 2021-09-24 DIAGNOSIS — Z79899 Other long term (current) drug therapy: Secondary | ICD-10-CM | POA: Diagnosis not present

## 2021-09-24 DIAGNOSIS — F32A Depression, unspecified: Secondary | ICD-10-CM | POA: Diagnosis not present

## 2021-09-24 DIAGNOSIS — Z0000 Encounter for general adult medical examination without abnormal findings: Secondary | ICD-10-CM | POA: Diagnosis not present

## 2021-09-24 DIAGNOSIS — I10 Essential (primary) hypertension: Secondary | ICD-10-CM | POA: Diagnosis not present

## 2021-09-24 DIAGNOSIS — R7309 Other abnormal glucose: Secondary | ICD-10-CM | POA: Diagnosis not present

## 2021-11-09 ENCOUNTER — Ambulatory Visit
Admission: RE | Admit: 2021-11-09 | Discharge: 2021-11-09 | Disposition: A | Discharge status: Home / Self Care | Payer: PPO | Source: Ambulatory Visit | Attending: Family Medicine | Admitting: Family Medicine

## 2021-11-09 DIAGNOSIS — N6315 Unspecified lump in the right breast, overlapping quadrants: Secondary | ICD-10-CM | POA: Diagnosis not present

## 2021-11-09 DIAGNOSIS — R928 Other abnormal and inconclusive findings on diagnostic imaging of breast: Secondary | ICD-10-CM | POA: Diagnosis not present

## 2021-11-09 DIAGNOSIS — Z1231 Encounter for screening mammogram for malignant neoplasm of breast: Secondary | ICD-10-CM | POA: Diagnosis not present

## 2021-11-09 DIAGNOSIS — N6310 Unspecified lump in the right breast, unspecified quadrant: Secondary | ICD-10-CM | POA: Insufficient documentation

## 2021-11-09 DIAGNOSIS — N6312 Unspecified lump in the right breast, upper inner quadrant: Secondary | ICD-10-CM | POA: Diagnosis not present

## 2021-11-09 DIAGNOSIS — N6314 Unspecified lump in the right breast, lower inner quadrant: Secondary | ICD-10-CM | POA: Diagnosis not present

## 2021-11-11 DIAGNOSIS — H2513 Age-related nuclear cataract, bilateral: Secondary | ICD-10-CM | POA: Diagnosis not present

## 2021-11-11 DIAGNOSIS — H524 Presbyopia: Secondary | ICD-10-CM | POA: Diagnosis not present

## 2021-11-11 DIAGNOSIS — H25013 Cortical age-related cataract, bilateral: Secondary | ICD-10-CM | POA: Diagnosis not present

## 2021-11-11 DIAGNOSIS — I10 Essential (primary) hypertension: Secondary | ICD-10-CM | POA: Diagnosis not present

## 2021-12-29 DIAGNOSIS — Z86711 Personal history of pulmonary embolism: Secondary | ICD-10-CM | POA: Diagnosis not present

## 2021-12-29 DIAGNOSIS — R0602 Shortness of breath: Secondary | ICD-10-CM | POA: Diagnosis not present

## 2021-12-29 DIAGNOSIS — I340 Nonrheumatic mitral (valve) insufficiency: Secondary | ICD-10-CM | POA: Diagnosis not present

## 2021-12-29 DIAGNOSIS — I10 Essential (primary) hypertension: Secondary | ICD-10-CM | POA: Diagnosis not present

## 2021-12-29 DIAGNOSIS — I493 Ventricular premature depolarization: Secondary | ICD-10-CM | POA: Diagnosis not present

## 2021-12-29 DIAGNOSIS — J984 Other disorders of lung: Secondary | ICD-10-CM | POA: Diagnosis not present

## 2021-12-29 DIAGNOSIS — R011 Cardiac murmur, unspecified: Secondary | ICD-10-CM | POA: Diagnosis not present

## 2022-03-24 DIAGNOSIS — R7302 Impaired glucose tolerance (oral): Secondary | ICD-10-CM | POA: Diagnosis not present

## 2022-03-24 DIAGNOSIS — E785 Hyperlipidemia, unspecified: Secondary | ICD-10-CM | POA: Diagnosis not present

## 2022-03-26 DIAGNOSIS — F32A Depression, unspecified: Secondary | ICD-10-CM | POA: Diagnosis not present

## 2022-03-26 DIAGNOSIS — R7309 Other abnormal glucose: Secondary | ICD-10-CM | POA: Diagnosis not present

## 2022-03-26 DIAGNOSIS — G894 Chronic pain syndrome: Secondary | ICD-10-CM | POA: Diagnosis not present

## 2022-03-26 DIAGNOSIS — E785 Hyperlipidemia, unspecified: Secondary | ICD-10-CM | POA: Diagnosis not present

## 2022-03-26 DIAGNOSIS — I10 Essential (primary) hypertension: Secondary | ICD-10-CM | POA: Diagnosis not present

## 2022-06-17 ENCOUNTER — Other Ambulatory Visit (HOSPITAL_COMMUNITY): Payer: Self-pay | Admitting: Physician Assistant

## 2022-06-17 DIAGNOSIS — M25552 Pain in left hip: Secondary | ICD-10-CM

## 2022-06-18 ENCOUNTER — Ambulatory Visit
Admission: RE | Admit: 2022-06-18 | Discharge: 2022-06-18 | Disposition: A | Discharge status: Home / Self Care | Payer: PPO | Source: Ambulatory Visit | Attending: Physician Assistant | Admitting: Physician Assistant

## 2022-06-18 DIAGNOSIS — M25552 Pain in left hip: Secondary | ICD-10-CM

## 2022-06-18 DIAGNOSIS — M1612 Unilateral primary osteoarthritis, left hip: Secondary | ICD-10-CM | POA: Diagnosis not present

## 2022-06-28 DIAGNOSIS — M25552 Pain in left hip: Secondary | ICD-10-CM | POA: Diagnosis not present

## 2022-07-05 DIAGNOSIS — Z86711 Personal history of pulmonary embolism: Secondary | ICD-10-CM | POA: Diagnosis not present

## 2022-07-05 DIAGNOSIS — R011 Cardiac murmur, unspecified: Secondary | ICD-10-CM | POA: Diagnosis not present

## 2022-07-05 DIAGNOSIS — J984 Other disorders of lung: Secondary | ICD-10-CM | POA: Diagnosis not present

## 2022-07-05 DIAGNOSIS — I493 Ventricular premature depolarization: Secondary | ICD-10-CM | POA: Diagnosis not present

## 2022-07-05 DIAGNOSIS — I10 Essential (primary) hypertension: Secondary | ICD-10-CM | POA: Diagnosis not present

## 2022-07-05 DIAGNOSIS — I340 Nonrheumatic mitral (valve) insufficiency: Secondary | ICD-10-CM | POA: Diagnosis not present

## 2022-07-28 ENCOUNTER — Emergency Department: Payer: PPO

## 2022-07-28 ENCOUNTER — Other Ambulatory Visit: Payer: Self-pay

## 2022-07-28 ENCOUNTER — Emergency Department
Admission: EM | Admit: 2022-07-28 | Discharge: 2022-07-28 | Disposition: A | Discharge status: Home / Self Care | Payer: PPO | Attending: Emergency Medicine | Admitting: Emergency Medicine

## 2022-07-28 DIAGNOSIS — R011 Cardiac murmur, unspecified: Secondary | ICD-10-CM | POA: Diagnosis not present

## 2022-07-28 DIAGNOSIS — R0602 Shortness of breath: Secondary | ICD-10-CM | POA: Diagnosis not present

## 2022-07-28 DIAGNOSIS — R569 Unspecified convulsions: Secondary | ICD-10-CM

## 2022-07-28 DIAGNOSIS — I493 Ventricular premature depolarization: Secondary | ICD-10-CM | POA: Diagnosis not present

## 2022-07-28 DIAGNOSIS — Z86711 Personal history of pulmonary embolism: Secondary | ICD-10-CM | POA: Diagnosis not present

## 2022-07-28 DIAGNOSIS — I340 Nonrheumatic mitral (valve) insufficiency: Secondary | ICD-10-CM | POA: Diagnosis not present

## 2022-07-28 DIAGNOSIS — I10 Essential (primary) hypertension: Secondary | ICD-10-CM | POA: Diagnosis not present

## 2022-07-28 DIAGNOSIS — J984 Other disorders of lung: Secondary | ICD-10-CM | POA: Diagnosis not present

## 2022-07-28 DIAGNOSIS — R55 Syncope and collapse: Secondary | ICD-10-CM

## 2022-07-28 DIAGNOSIS — R001 Bradycardia, unspecified: Secondary | ICD-10-CM | POA: Diagnosis not present

## 2022-07-28 LAB — CBC
HCT: 37.2 % (ref 36.0–46.0)
Hemoglobin: 12.3 g/dL (ref 12.0–15.0)
MCH: 31.7 pg (ref 26.0–34.0)
MCHC: 33.1 g/dL (ref 30.0–36.0)
MCV: 95.9 fL (ref 80.0–100.0)
Platelets: 603 10*3/uL — ABNORMAL HIGH (ref 150–400)
RBC: 3.88 MIL/uL (ref 3.87–5.11)
RDW: 13.5 % (ref 11.5–15.5)
WBC: 9.8 10*3/uL (ref 4.0–10.5)
nRBC: 0 % (ref 0.0–0.2)

## 2022-07-28 LAB — BASIC METABOLIC PANEL
Anion gap: 12 (ref 5–15)
BUN: 9 mg/dL (ref 8–23)
CO2: 30 mmol/L (ref 22–32)
Calcium: 9.7 mg/dL (ref 8.9–10.3)
Chloride: 94 mmol/L — ABNORMAL LOW (ref 98–111)
Creatinine, Ser: 0.6 mg/dL (ref 0.44–1.00)
GFR, Estimated: >60 mL/min (ref >60)
Glucose, Bld: 129 mg/dL — ABNORMAL HIGH (ref 70–99)
Potassium: 3.1 mmol/L — ABNORMAL LOW (ref 3.5–5.1)
Sodium: 136 mmol/L (ref 135–145)

## 2022-07-28 LAB — TROPONIN I (HIGH SENSITIVITY): Troponin I (High Sensitivity): 11 ng/L (ref <18)

## 2022-07-28 LAB — MAGNESIUM: Magnesium: 1.9 mg/dL (ref 1.7–2.4)

## 2022-07-28 MED ORDER — POTASSIUM CHLORIDE ER 10 MEQ PO TBCR: 10.0000 meq | EXTENDED_RELEASE_TABLET | Freq: Two times a day (BID) | ORAL | 0 refills | Status: DC

## 2022-07-28 NOTE — Discharge Instructions (Addendum)
Your CT scan and blood work do not show any abnormal findings except for a slightly low potassium level.  Your EKG shows something called a long QT which can sometimes cause arrhythmias although there is no sign of this right now.  Take the potassium tablet as prescribed.  Follow-up with Dr. Clayborn Bigness.  Return to the ER for new, worsening, or persistent weakness, lightheadedness, episodes of passing out, any new or recurrent seizures, palpitations, feeling of skipped beats in your heart, or any other new or worsening symptoms that concern you.

## 2022-07-28 NOTE — ED Triage Notes (Signed)
Pt at dentist office getting implants. Pt received 2.5 mg of Xiodacaine for numbing when staff report she had 2x 10 sec long seizures that were full body. No history of seizures. Pt does not remember anything from numbing shot until EMS got there. Negative for stroke by EMS except for facial numbness from the numbing shot. BP 220/110 and she has history of HTN.

## 2022-07-28 NOTE — ED Notes (Signed)
Pt walked to bathroom with assistance from this RN. Tolerated well. Pt given water and visitor at bedside. Call light within reach

## 2022-07-28 NOTE — ED Provider Notes (Signed)
[facility] Provider Note    Event Date/Time   First MD Initiated Contact with Patient 07/28/22 1100     (approximate)   History   Seizures (2x 10 sec full body seizures and BP 220/110)   HPI  Holly Bowen is a 67 y.o. female with a history of IBS, splenectomy, pulmonary embolism on Xarelto, hypertension, and chronic restrictive lung disease who presents with an episode of loss of consciousness and possible seizure.  Per EMS, the patient had been getting a dental procedure.  She had lidocaine injected locally for anesthesia.  She states that she then started to feel very shaky and tremulous in her arms.  Subsequently she lost consciousness and awoke with people standing around her.  EMS states that the patient apparently had 2 very brief seizure-like episode lasting about 10 seconds each.  She did not have any urinary incontinence and did not bite her tongue.  The patient is asymptomatic at this time.  She denies any prior episodes like this.  She states that she is compliant with her medications.  She denies any known allergy or adverse reaction to local anesthetics.  I reviewed the past medical records.  The patient was seen by her cardiologist Dr. Clayborn Bigness on 2/5 for evaluation of exertional shortness of breath.  She has no other recent ED visits or admissions.  Physical Exam   Triage Vital Signs: ED Triage Vitals  Enc Vitals Group     BP 07/28/22 1100 (!) 201/87     Pulse Rate 07/28/22 1100 70     Resp 07/28/22 1100 19     Temp 07/28/22 1056 98.6 F (37 C)     Temp Source 07/28/22 1056 Oral     SpO2 07/28/22 1100 93 %     Weight 07/28/22 1056 180 lb (81.6 kg)     Height 07/28/22 1056 '5\' 5"'$  (1.651 m)     Head Circumference --      Peak Flow --      Pain Score 07/28/22 1055 0     Pain Loc --      Pain Edu? --      Excl. in [place]? --     Most recent vital signs: Vitals:   07/28/22 1300 07/28/22 1335  BP: (!) 180/86 (!) 193/92  Pulse: 70 69  Resp:  18 16  Temp:    SpO2: 92% 93%     General: Alert and oriented, comfortable appearing. CV:  Good peripheral perfusion.  Normal heart sounds Resp:  Normal effort.  Lungs CTAB. Abd:  No distention.  Other:  EOMI.  PERRLA.  Cranial nerves III through XII intact.  Motor and sensory intact in all extremities.  No ataxia on finger-to-nose.  No pronator drift.   ED Results / Procedures / Treatments   Labs (all labs ordered are listed, but only abnormal results are displayed) Labs Reviewed  CBC - Abnormal; Notable for the following components:      Result Value   Platelets 603 (*)    All other components within normal limits  BASIC METABOLIC PANEL - Abnormal; Notable for the following components:   Potassium 3.1 (*)    Chloride 94 (*)    Glucose, Bld 129 (*)    All other components within normal limits  MAGNESIUM  TROPONIN I (HIGH SENSITIVITY)     EKG  ED ECG REPORT I, Arta Silence, the attending physician, personally viewed and interpreted this ECG.  Date: 07/28/2022 EKG Time: 1217 Rate:  17 Rhythm: normal sinus rhythm QRS Axis: normal Intervals: Prolonged QTc ST/T Wave abnormalities: Nonspecific wave abnormalities Narrative Interpretation: Prolonged QTc with no evidence of acute ischemia; no recent prior EKG available for comparison    RADIOLOGY  CT head: I independently viewed and interpreted the images; there is no ICH.  Radiology report indicates no acute abnormality.  PROCEDURES:  Critical Care performed: No  Procedures   MEDICATIONS ORDERED IN ED: Medications - No data to display   IMPRESSION / MDM / [place] / ED COURSE  I reviewed the triage vital signs and the nursing notes.  67 year old female with PMH as noted above presents with an acute episode of loss of consciousness and brief seizure-like episodes while receiving a dental procedure, with a prodrome of feeling shaky and weak after getting local anesthetic.  Differential  diagnosis includes, but is not limited to, vasovagal syncope, other benign syncopal episode, cardiac dysrhythmia, less likely seizure.  I have a lower suspicion for true seizures given the prodrome, the brief nature of the episodes, and the lack of associated symptoms such as tongue biting.  I suspect that the patient syncopized and then had brief seizure-like activity.  There is no evidence that she received any of the lidocaine intravenously or had an acute reaction to the medication itself.  We will obtain lab workup, CT head, and reassess.  Patient's presentation is most consistent with acute presentation with potential threat to life or bodily function.  The patient is on the cardiac monitor to evaluate for evidence of arrhythmia and/or significant heart rate changes.  ----------------------------------------- 1:37 PM on 07/28/2022 -----------------------------------------  CT head is negative acute findings.  Labs are unremarkable except for borderline low potassium.  Troponin is negative.  Magnesium is normal.  EKG shows prolonged QTc.  The patient's most recent EKG that I am able to see was from 2012 and at that time her QTc was 640.  A twelve-lead EKG from 2/5 at the cardiology office showed a QTc of 453 although I am not able to see this EKG directly.  I consulted Dr. Saralyn Pilar from cardiology and discussed the case with him.  He advised that given that the patient was consistent with a vasovagal episode or other benign etiology and the patient was otherwise stable with negative workup, she was appropriate for discharge with close outpatient follow-up.  The patient herself feels well and would like to go home.  I counseled her on the results of the workup including the broad QTc, return precautions, and the plan of care including cardiology follow-up; she expressed understanding and agreement.  She is stable for discharge at this time.  I have ordered oral potassium repletion for  home.  FINAL CLINICAL IMPRESSION(S) / ED DIAGNOSES   Final diagnoses:  Syncope, unspecified syncope type  Seizure-like activity ([place])     Rx / DC Orders   ED Discharge Orders          Ordered    potassium chloride (KLOR-CON) 10 MEQ tablet  2 times daily,   Status:  Discontinued        07/28/22 1336    potassium chloride (KLOR-CON) 10 MEQ tablet  2 times daily        07/28/22 1343             Note:  This document was prepared using Dragon voice recognition software and may include unintentional dictation errors.    Arta Silence, MD 07/28/22 1355

## 2022-08-26 DIAGNOSIS — I10 Essential (primary) hypertension: Secondary | ICD-10-CM | POA: Diagnosis not present

## 2022-08-26 DIAGNOSIS — R011 Cardiac murmur, unspecified: Secondary | ICD-10-CM | POA: Diagnosis not present

## 2022-08-26 DIAGNOSIS — Z86711 Personal history of pulmonary embolism: Secondary | ICD-10-CM | POA: Diagnosis not present

## 2022-08-26 DIAGNOSIS — R001 Bradycardia, unspecified: Secondary | ICD-10-CM | POA: Diagnosis not present

## 2022-08-26 DIAGNOSIS — J984 Other disorders of lung: Secondary | ICD-10-CM | POA: Diagnosis not present

## 2022-08-26 DIAGNOSIS — I493 Ventricular premature depolarization: Secondary | ICD-10-CM | POA: Diagnosis not present

## 2022-08-26 DIAGNOSIS — I340 Nonrheumatic mitral (valve) insufficiency: Secondary | ICD-10-CM | POA: Diagnosis not present

## 2022-09-17 DIAGNOSIS — I10 Essential (primary) hypertension: Secondary | ICD-10-CM | POA: Diagnosis not present

## 2022-09-17 DIAGNOSIS — E785 Hyperlipidemia, unspecified: Secondary | ICD-10-CM | POA: Diagnosis not present

## 2022-09-17 DIAGNOSIS — R7309 Other abnormal glucose: Secondary | ICD-10-CM | POA: Diagnosis not present

## 2022-09-24 DIAGNOSIS — Z0000 Encounter for general adult medical examination without abnormal findings: Secondary | ICD-10-CM | POA: Diagnosis not present

## 2022-09-24 DIAGNOSIS — F419 Anxiety disorder, unspecified: Secondary | ICD-10-CM | POA: Diagnosis not present

## 2022-09-24 DIAGNOSIS — M542 Cervicalgia: Secondary | ICD-10-CM | POA: Diagnosis not present

## 2022-09-24 DIAGNOSIS — M8580 Other specified disorders of bone density and structure, unspecified site: Secondary | ICD-10-CM | POA: Diagnosis not present

## 2022-09-24 DIAGNOSIS — M549 Dorsalgia, unspecified: Secondary | ICD-10-CM | POA: Diagnosis not present

## 2022-09-24 DIAGNOSIS — I10 Essential (primary) hypertension: Secondary | ICD-10-CM | POA: Diagnosis not present

## 2022-09-24 DIAGNOSIS — R7309 Other abnormal glucose: Secondary | ICD-10-CM | POA: Diagnosis not present

## 2022-10-05 ENCOUNTER — Other Ambulatory Visit: Payer: Self-pay | Admitting: Family Medicine

## 2022-10-05 DIAGNOSIS — N6310 Unspecified lump in the right breast, unspecified quadrant: Secondary | ICD-10-CM

## 2023-01-10 DIAGNOSIS — J449 Chronic obstructive pulmonary disease, unspecified: Secondary | ICD-10-CM | POA: Diagnosis not present

## 2023-01-10 DIAGNOSIS — Z86711 Personal history of pulmonary embolism: Secondary | ICD-10-CM | POA: Diagnosis not present

## 2023-01-10 DIAGNOSIS — I493 Ventricular premature depolarization: Secondary | ICD-10-CM | POA: Diagnosis not present

## 2023-01-10 DIAGNOSIS — R001 Bradycardia, unspecified: Secondary | ICD-10-CM | POA: Diagnosis not present

## 2023-01-10 DIAGNOSIS — R002 Palpitations: Secondary | ICD-10-CM | POA: Diagnosis not present

## 2023-01-10 DIAGNOSIS — I10 Essential (primary) hypertension: Secondary | ICD-10-CM | POA: Diagnosis not present

## 2023-01-10 DIAGNOSIS — R011 Cardiac murmur, unspecified: Secondary | ICD-10-CM | POA: Diagnosis not present

## 2023-01-10 DIAGNOSIS — J984 Other disorders of lung: Secondary | ICD-10-CM | POA: Diagnosis not present

## 2023-01-10 DIAGNOSIS — I340 Nonrheumatic mitral (valve) insufficiency: Secondary | ICD-10-CM | POA: Diagnosis not present

## 2023-01-10 DIAGNOSIS — F419 Anxiety disorder, unspecified: Secondary | ICD-10-CM | POA: Diagnosis not present

## 2023-01-25 IMAGING — MG DIGITAL DIAGNOSTIC BILAT W/ TOMO W/ CAD
8 series · 8 of 24 positions shown · non-contrast
Comparison: Previous exam(s).

CLINICAL DATA: 66-year-old female presenting for follow-up of a
likely benign right breast mass.

EXAM:
DIGITAL DIAGNOSTIC BILATERAL MAMMOGRAM WITH TOMOSYNTHESIS AND CAD;
ULTRASOUND RIGHT BREAST LIMITED
TECHNIQUE: Bilateral digital diagnostic mammography and breast tomosynthesis
was performed. The images were evaluated with computer-aided
detection.; Targeted ultrasound examination of the right breast was
performed

[L MLO synth-2D]
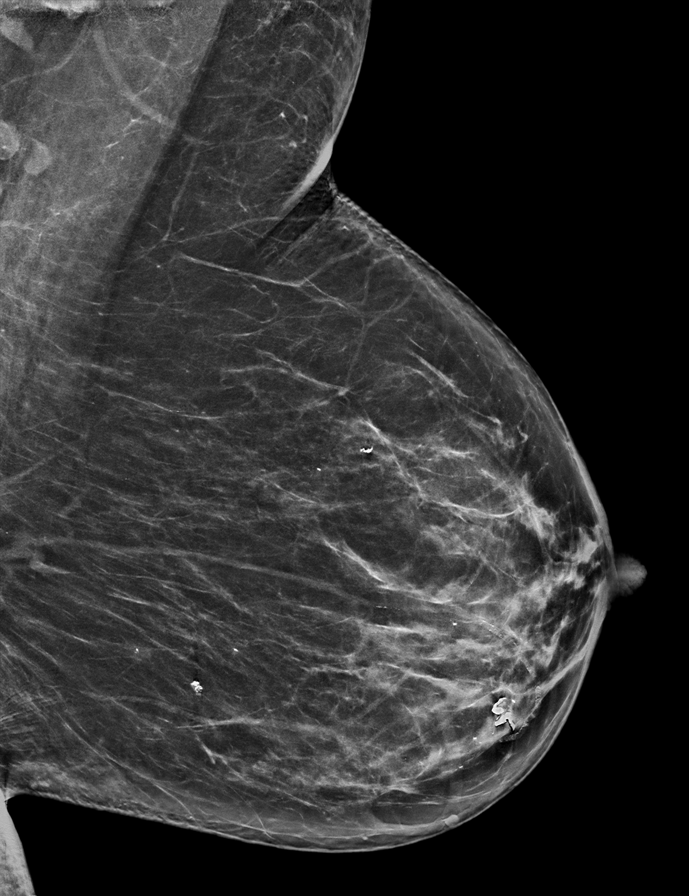

[R CC synth-2D]
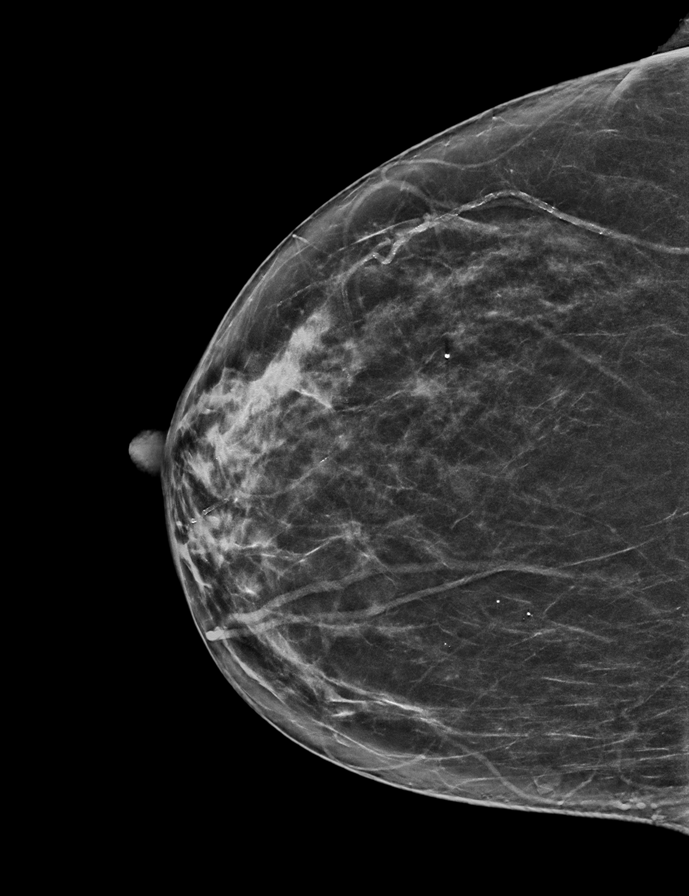

[R MLO synth-2D]
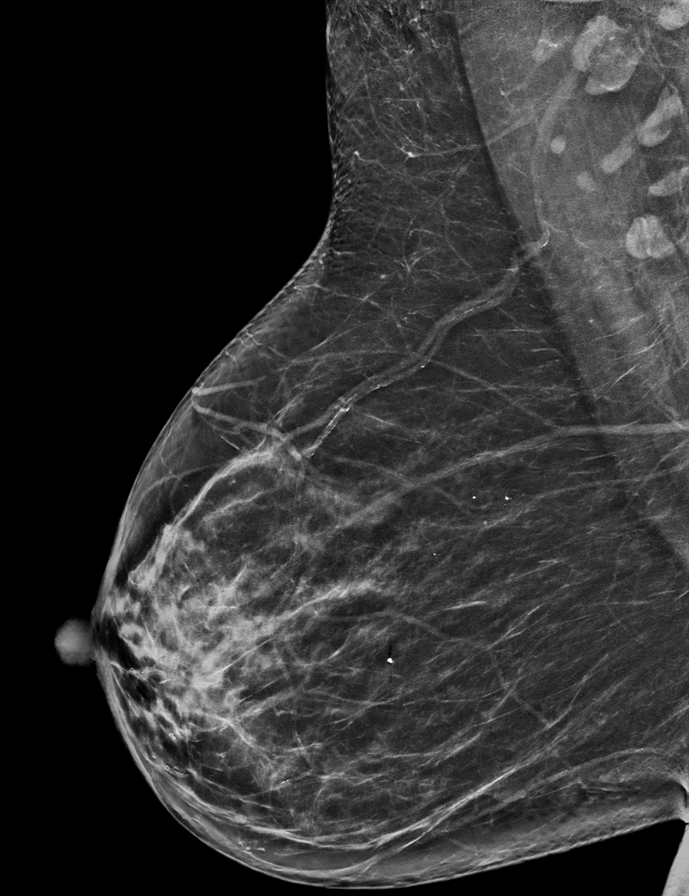

[L CC synth-2D]
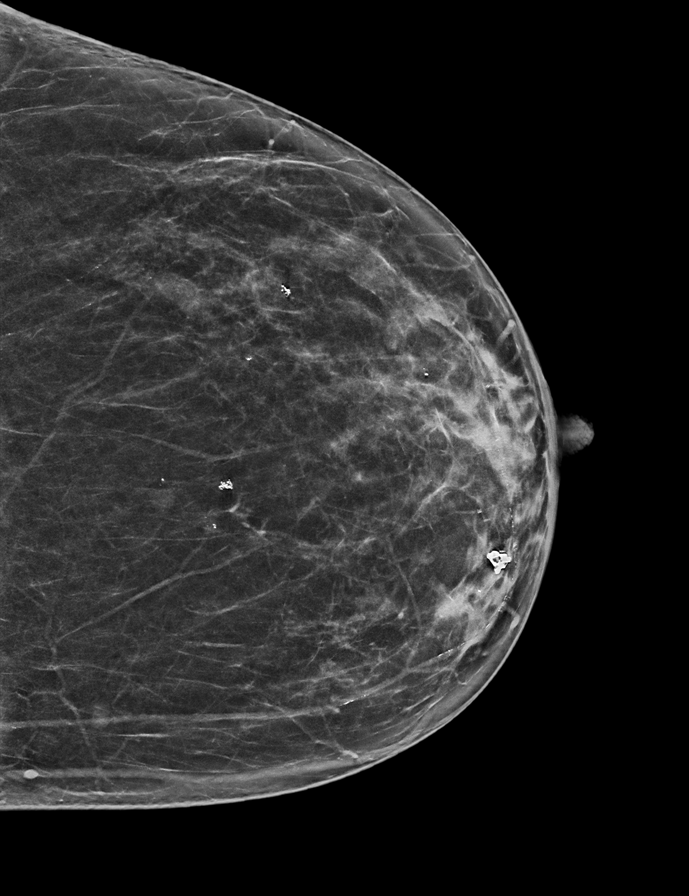

[L MLO tomo · tomo slice 40/79.0]
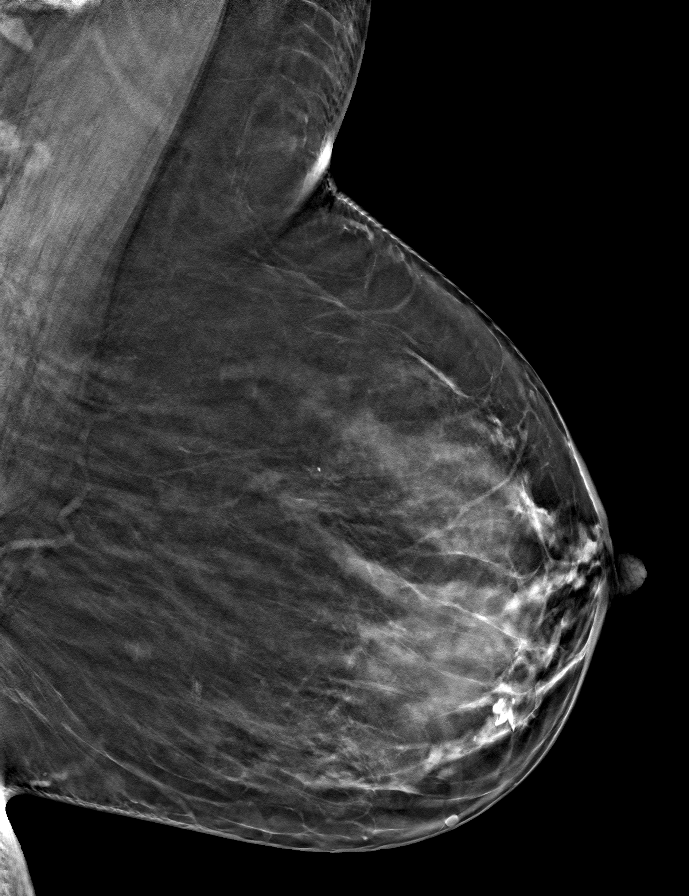

[R CC tomo · tomo slice 33/66.0]
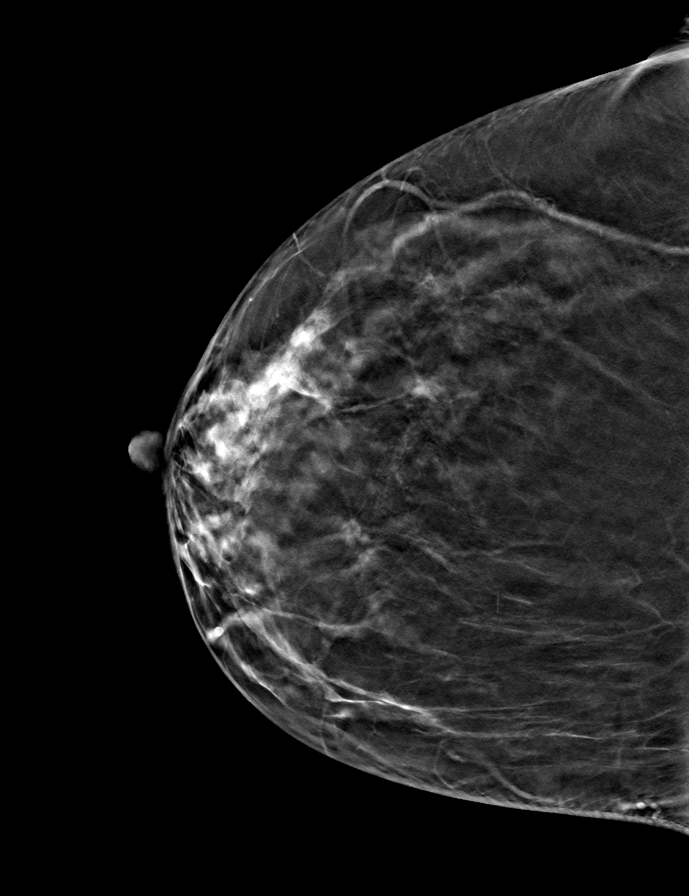

[R MLO tomo · tomo slice 37/74.0]
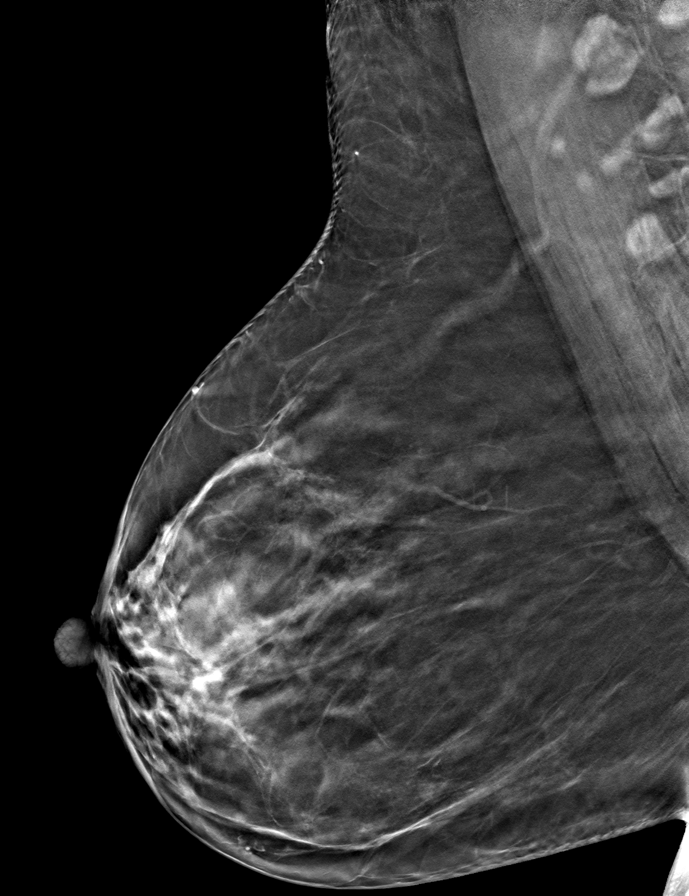

[L CC tomo · tomo slice 33/65.0]
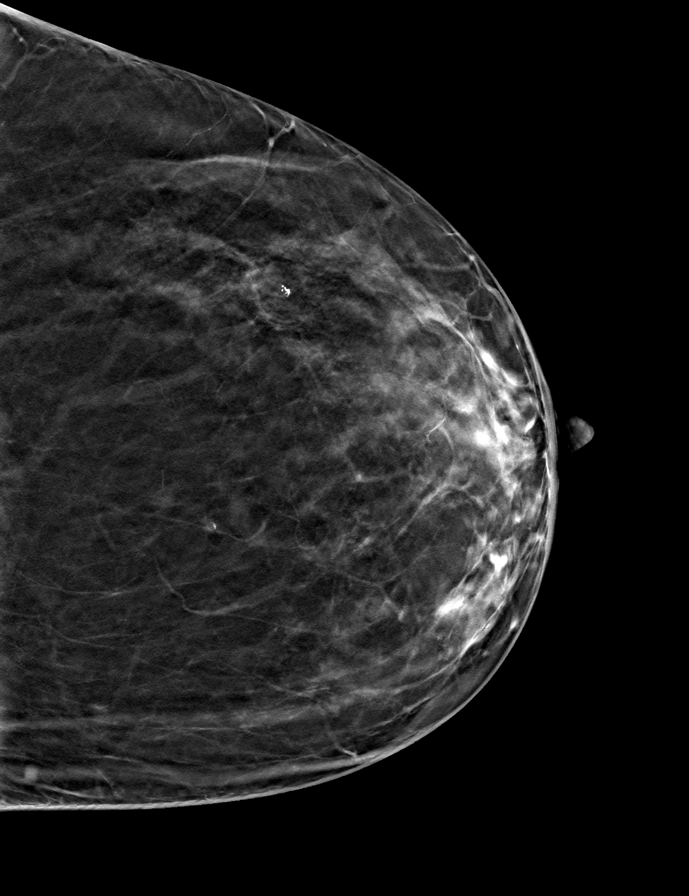

[8 of 24 positions shown; findings below may reference images not displayed]

ACR Breast Density Category b: There are scattered areas of
fibroglandular density.
FINDINGS: The asymmetry in the medial aspect of the right breast is stable. No
suspicious calcifications, masses or areas of distortion are seen in
the bilateral breasts.

Ultrasound targeted to the right breast at 3 o'clock, 3 cm from the
nipple demonstrates a stable oval hypoechoic mass measuring 6 x 2 x
5 mm, previously measuring 6 x 2 x 5 mm.
IMPRESSION: 1. The likely benign right breast mass at 3 o'clock is stable.

2. No suspicious calcifications, masses or areas of distortion are
seen in the bilateral breasts.

RECOMMENDATION:
Bilateral diagnostic mammogram and right breast ultrasound in 1
year.

I have discussed the findings and recommendations with the patient.
If applicable, a reminder letter will be sent to the patient
regarding the next appointment.

BI-RADS CATEGORY  3: Probably benign.

## 2023-01-25 IMAGING — US US BREAST*R* LIMITED INC AXILLA
1 series · 5 of 5 positions shown · non-contrast
Comparison: Previous exam(s).

CLINICAL DATA: 66-year-old female presenting for follow-up of a
likely benign right breast mass.

EXAM:
DIGITAL DIAGNOSTIC BILATERAL MAMMOGRAM WITH TOMOSYNTHESIS AND CAD;
ULTRASOUND RIGHT BREAST LIMITED
TECHNIQUE: Bilateral digital diagnostic mammography and breast tomosynthesis
was performed. The images were evaluated with computer-aided
detection.; Targeted ultrasound examination of the right breast was
performed

[Series 1: us breast*right* limited inc axilla · 0.04mm/px · 5 of 5 slices shown]
[im 1/5]
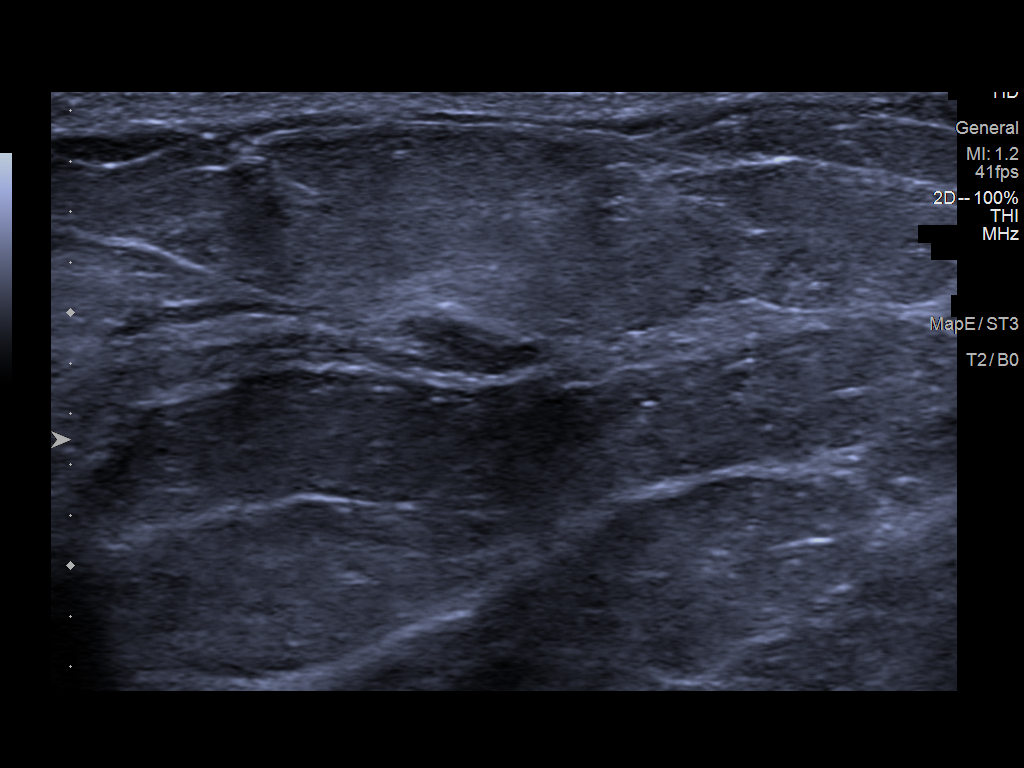
[im 2/5]
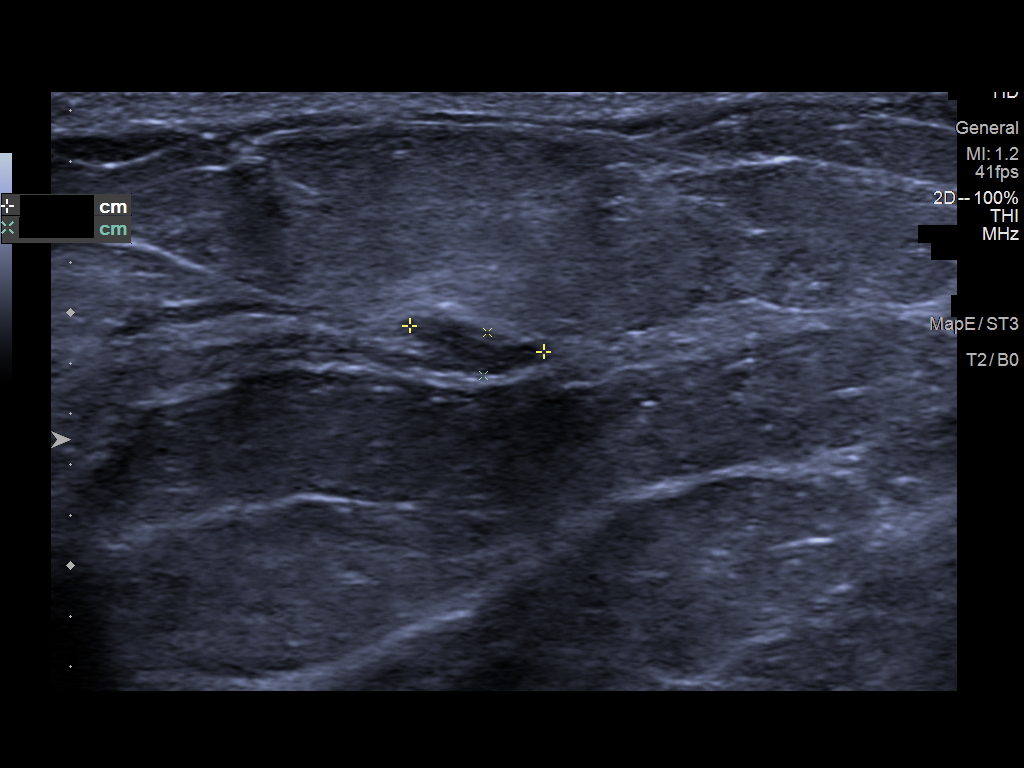
[im 3/5]
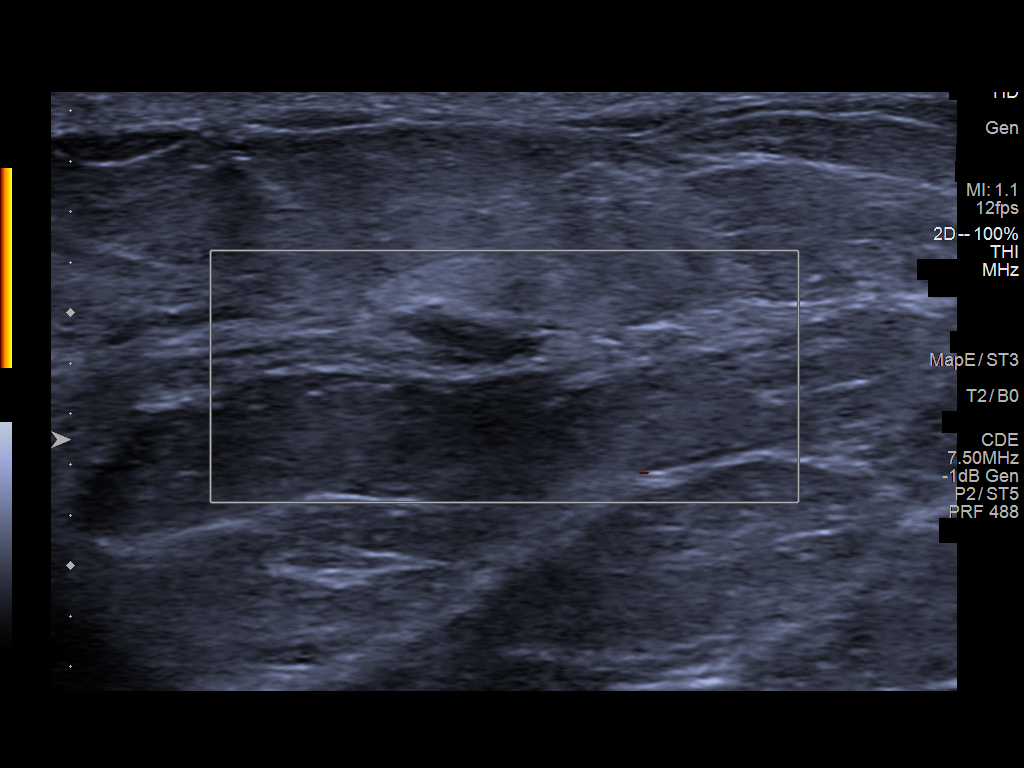
[im 4/5]
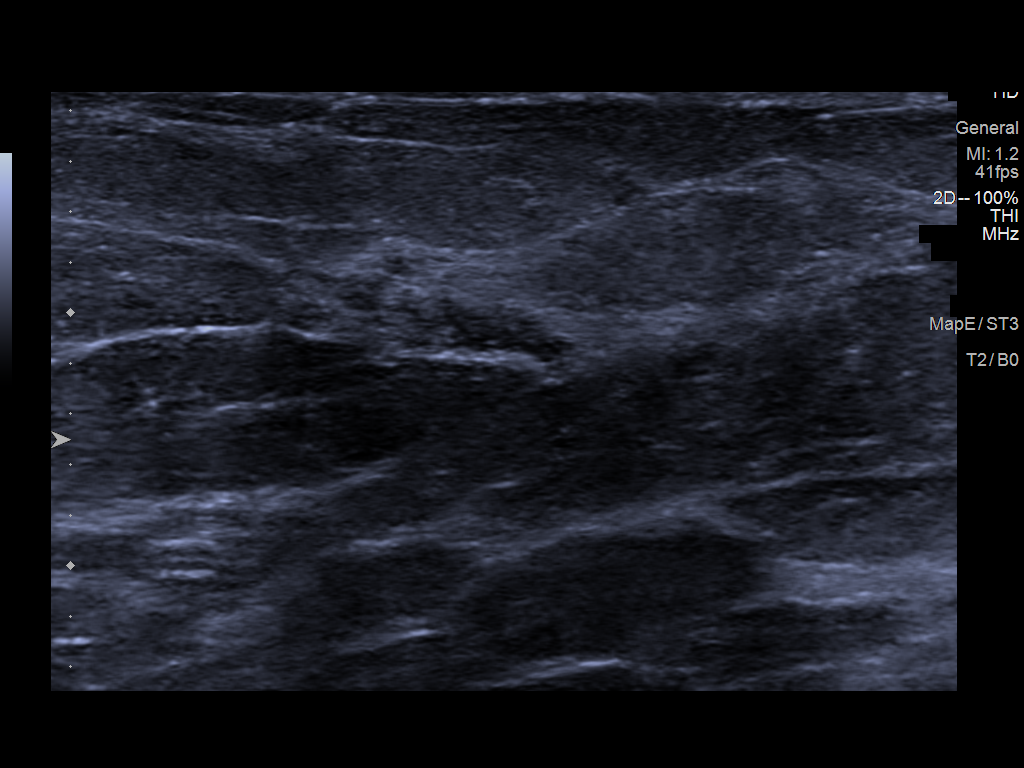
[im 5/5]
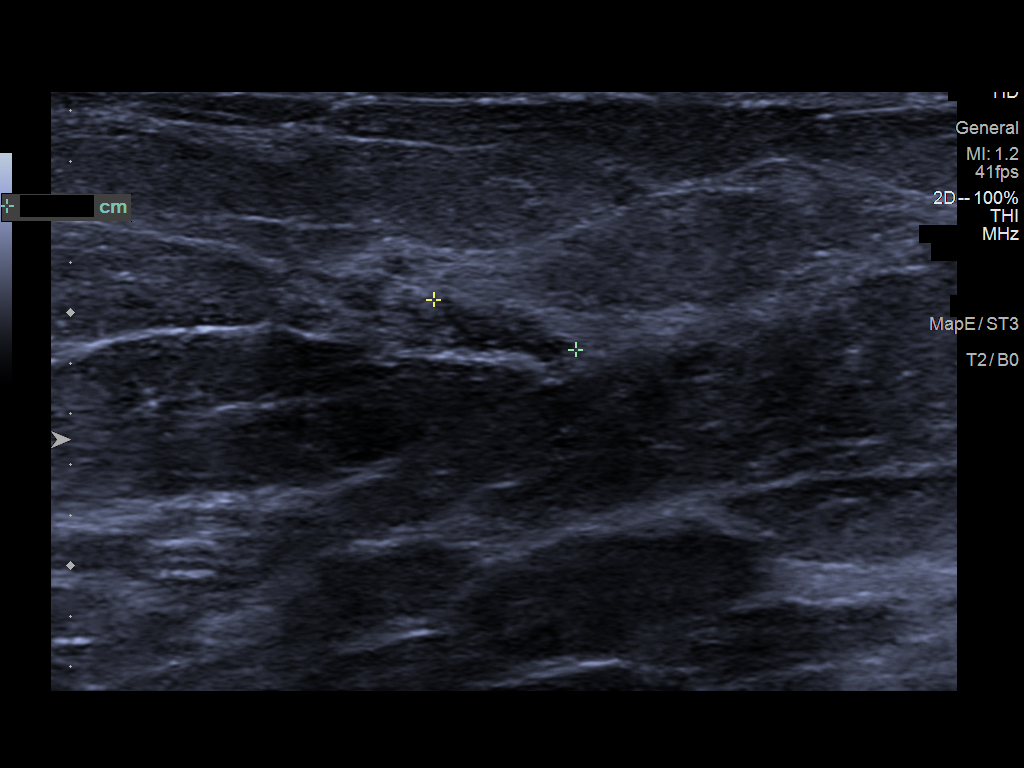

[5 of 5 positions shown; findings below may reference images not displayed]

ACR Breast Density Category b: There are scattered areas of
fibroglandular density.
FINDINGS: The asymmetry in the medial aspect of the right breast is stable. No
suspicious calcifications, masses or areas of distortion are seen in
the bilateral breasts.

Ultrasound targeted to the right breast at 3 o'clock, 3 cm from the
nipple demonstrates a stable oval hypoechoic mass measuring 6 x 2 x
5 mm, previously measuring 6 x 2 x 5 mm.
IMPRESSION: 1. The likely benign right breast mass at 3 o'clock is stable.

2. No suspicious calcifications, masses or areas of distortion are
seen in the bilateral breasts.

RECOMMENDATION:
Bilateral diagnostic mammogram and right breast ultrasound in 1
year.

I have discussed the findings and recommendations with the patient.
If applicable, a reminder letter will be sent to the patient
regarding the next appointment.

BI-RADS CATEGORY  3: Probably benign.

## 2023-03-28 DIAGNOSIS — I10 Essential (primary) hypertension: Secondary | ICD-10-CM | POA: Diagnosis not present

## 2023-03-28 DIAGNOSIS — F32A Depression, unspecified: Secondary | ICD-10-CM | POA: Diagnosis not present

## 2023-03-28 DIAGNOSIS — G894 Chronic pain syndrome: Secondary | ICD-10-CM | POA: Diagnosis not present

## 2023-03-28 DIAGNOSIS — Z23 Encounter for immunization: Secondary | ICD-10-CM | POA: Diagnosis not present

## 2023-03-28 DIAGNOSIS — R7309 Other abnormal glucose: Secondary | ICD-10-CM | POA: Diagnosis not present

## 2023-03-28 DIAGNOSIS — E785 Hyperlipidemia, unspecified: Secondary | ICD-10-CM | POA: Diagnosis not present

## 2023-04-10 ENCOUNTER — Emergency Department
Admission: EM | Admit: 2023-04-10 | Discharge: 2023-04-10 | Disposition: A | Discharge status: Home / Self Care | Payer: PPO | Attending: Emergency Medicine | Admitting: Emergency Medicine

## 2023-04-10 ENCOUNTER — Other Ambulatory Visit: Payer: Self-pay

## 2023-04-10 DIAGNOSIS — I10 Essential (primary) hypertension: Secondary | ICD-10-CM | POA: Diagnosis not present

## 2023-04-10 DIAGNOSIS — T783XXA Angioneurotic edema, initial encounter: Secondary | ICD-10-CM | POA: Insufficient documentation

## 2023-04-10 DIAGNOSIS — R220 Localized swelling, mass and lump, head: Secondary | ICD-10-CM | POA: Diagnosis present

## 2023-04-10 MED ORDER — DIPHENHYDRAMINE HCL 50 MG/ML IJ SOLN
25.0000 mg | Freq: Once | INTRAMUSCULAR | Status: AC
  Administered 2023-04-10: 25 mg via INTRAVENOUS
  Filled 2023-04-10: qty 1

## 2023-04-10 MED ORDER — SODIUM CHLORIDE 0.9 % IV BOLUS
1000.0000 mL | Freq: Once | INTRAVENOUS | Status: AC
  Administered 2023-04-10: 1000 mL via INTRAVENOUS

## 2023-04-10 MED ORDER — METHYLPREDNISOLONE 4 MG PO TBPK: ORAL_TABLET | ORAL | 0 refills | Status: DC

## 2023-04-10 MED ORDER — FAMOTIDINE IN NACL 20-0.9 MG/50ML-% IV SOLN
20.0000 mg | Freq: Once | INTRAVENOUS | Status: AC
  Administered 2023-04-10: 20 mg via INTRAVENOUS
  Filled 2023-04-10: qty 50

## 2023-04-10 MED ORDER — METHYLPREDNISOLONE SODIUM SUCC 125 MG IJ SOLR
125.0000 mg | Freq: Once | INTRAMUSCULAR | Status: AC
  Administered 2023-04-10: 125 mg via INTRAVENOUS
  Filled 2023-04-10: qty 2

## 2023-04-10 NOTE — ED Triage Notes (Signed)
Pt comes with c/o allergic reaction. Pt states about 430 something stung her and her face and lips have continued to swell. Pt states she just started itching and then noticed the swelling. Pt is speaking in clear sentences and no respiratory distress.

## 2023-04-10 NOTE — Discharge Instructions (Signed)
Stop taking the lisinopril, follow up with dr Juliann Pares, check your blood pressure daily, if high take a second metoprolol until you can be seen by your doctor Return if worsening

## 2023-04-10 NOTE — ED Provider Notes (Signed)
[facility] Provider Note    Event Date/Time   First MD Initiated Contact with Patient 04/10/23 1927     (approximate)   History   Allergic Reaction   HPI  Holly Bowen is a 67 y.o. female with history of hypertension, arrhythmia, IBS and dyspnea presents emergency department complaining of swelling of the face.  Was in the garden and noticed side of her face started getting itchy.  Did not feel a bug bite.  No difficulty talking, sore throat, or difficulty breathing.      Physical Exam   Triage Vital Signs: ED Triage Vitals [04/10/23 1848]  Encounter Vitals Group     BP (!) 227/99     Systolic BP Percentile      Diastolic BP Percentile      Pulse Rate 89     Resp 19     Temp 98.8 F (37.1 C)     Temp src      SpO2 93 %     Weight      Height      Head Circumference      Peak Flow      Pain Score 3     Pain Loc      Pain Education      Exclude from Growth Chart     Most recent vital signs: Vitals:   04/10/23 1848 04/10/23 1956  BP: (!) 227/99 (!) 217/97  Pulse: 89 81  Resp: 19 16  Temp: 98.8 F (37.1 C)   SpO2: 93% 96%     General: Awake, no distress.   CV:  Good peripheral perfusion. regular rate and  rhythm Resp:  Normal effort. Lungs CTA Abd:  No distention.   Other:  Facial swelling noted to the right upper lip, typical of angioedema, no hives noted in the throat, no swelling inside the mouth, airway is patent   ED Results / Procedures / Treatments   Labs (all labs ordered are listed, but only abnormal results are displayed) Labs Reviewed - No data to display   EKG     RADIOLOGY     PROCEDURES:   Procedures   MEDICATIONS ORDERED IN ED: Medications  sodium chloride 0.9 % bolus 1,000 mL (0 mLs Intravenous Stopped 04/10/23 2107)  methylPREDNISolone sodium succinate (SOLU-MEDROL) 125 mg/2 mL injection 125 mg (125 mg Intravenous Given 04/10/23 1949)  famotidine (PEPCID) IVPB 20 mg premix (0 mg  Intravenous Stopped 04/10/23 2026)  diphenhydrAMINE (BENADRYL) injection 25 mg (25 mg Intravenous Given 04/10/23 1949)     IMPRESSION / MDM / ASSESSMENT AND PLAN / ED COURSE  I reviewed the triage vital signs and the nursing notes.                              Differential diagnosis includes, but is not limited to, angioedema, allergic reaction, cellulitis  Patient's presentation is most consistent with acute illness / injury with system symptoms.   Since patient is on lisinopril and the swelling is only on the right side of upper lip feel this is most likely angioedema secondary to lisinopril.  Will do allergy protocol with 1 L normal saline, Solu-Medrol, Pepcid, and Benadryl.  Patient is to stop taking the lisinopril.  Follow-up with her regular doctor to see if they want to add another agent.  She can continue the metoprolol and HCTZ.  Patient is in agreement with this treatment plan.  She  appears to be very stable do not think she needs further treatment or workup at this time.  Discharged in stable condition.      FINAL CLINICAL IMPRESSION(S) / ED DIAGNOSES   Final diagnoses:  Angioedema, initial encounter     Rx / DC Orders   ED Discharge Orders          Ordered    methylPREDNISolone (MEDROL DOSEPAK) 4 MG TBPK tablet        04/10/23 2108             Note:  This document was prepared using Dragon voice recognition software and may include unintentional dictation errors.    Faythe Ghee, PA-C 04/10/23 2113    Minna Antis, MD 04/10/23 2253

## 2023-04-10 NOTE — ED Notes (Signed)
ED Provider at bedside. 

## 2023-04-12 DIAGNOSIS — I10 Essential (primary) hypertension: Secondary | ICD-10-CM | POA: Diagnosis not present

## 2023-04-12 DIAGNOSIS — Z86711 Personal history of pulmonary embolism: Secondary | ICD-10-CM | POA: Diagnosis not present

## 2023-04-12 DIAGNOSIS — R001 Bradycardia, unspecified: Secondary | ICD-10-CM | POA: Diagnosis not present

## 2023-04-12 DIAGNOSIS — R011 Cardiac murmur, unspecified: Secondary | ICD-10-CM | POA: Diagnosis not present

## 2023-04-12 DIAGNOSIS — R002 Palpitations: Secondary | ICD-10-CM | POA: Diagnosis not present

## 2023-04-12 DIAGNOSIS — I340 Nonrheumatic mitral (valve) insufficiency: Secondary | ICD-10-CM | POA: Diagnosis not present

## 2023-04-12 DIAGNOSIS — J984 Other disorders of lung: Secondary | ICD-10-CM | POA: Diagnosis not present

## 2023-04-12 DIAGNOSIS — I493 Ventricular premature depolarization: Secondary | ICD-10-CM | POA: Diagnosis not present

## 2023-04-12 DIAGNOSIS — J449 Chronic obstructive pulmonary disease, unspecified: Secondary | ICD-10-CM | POA: Diagnosis not present

## 2023-04-12 DIAGNOSIS — I871 Compression of vein: Secondary | ICD-10-CM | POA: Diagnosis not present

## 2023-04-12 DIAGNOSIS — F419 Anxiety disorder, unspecified: Secondary | ICD-10-CM | POA: Diagnosis not present

## 2023-04-15 ENCOUNTER — Other Ambulatory Visit: Payer: Self-pay | Admitting: Internal Medicine

## 2023-04-15 DIAGNOSIS — I871 Compression of vein: Secondary | ICD-10-CM

## 2023-04-21 ENCOUNTER — Observation Stay: Admission: RE | Admit: 2023-04-21 | Payer: PPO | Source: Ambulatory Visit

## 2023-05-02 ENCOUNTER — Encounter: Payer: Self-pay | Admitting: Emergency Medicine

## 2023-05-02 ENCOUNTER — Other Ambulatory Visit: Payer: Self-pay

## 2023-05-02 ENCOUNTER — Emergency Department: Payer: PPO

## 2023-05-02 ENCOUNTER — Inpatient Hospital Stay
Admission: EM | Admit: 2023-05-02 | Discharge: 2023-05-06 | DRG: 872 | Disposition: A | Discharge status: Skilled Nursing Facility | Payer: PPO | Attending: Internal Medicine | Admitting: Internal Medicine

## 2023-05-02 DIAGNOSIS — J984 Other disorders of lung: Secondary | ICD-10-CM | POA: Diagnosis not present

## 2023-05-02 DIAGNOSIS — Z86711 Personal history of pulmonary embolism: Secondary | ICD-10-CM | POA: Diagnosis present

## 2023-05-02 DIAGNOSIS — E876 Hypokalemia: Secondary | ICD-10-CM | POA: Diagnosis not present

## 2023-05-02 DIAGNOSIS — Z981 Arthrodesis status: Secondary | ICD-10-CM

## 2023-05-02 DIAGNOSIS — R630 Anorexia: Secondary | ICD-10-CM | POA: Diagnosis present

## 2023-05-02 DIAGNOSIS — G8929 Other chronic pain: Secondary | ICD-10-CM | POA: Diagnosis present

## 2023-05-02 DIAGNOSIS — E861 Hypovolemia: Secondary | ICD-10-CM | POA: Diagnosis not present

## 2023-05-02 DIAGNOSIS — I10 Essential (primary) hypertension: Secondary | ICD-10-CM | POA: Diagnosis not present

## 2023-05-02 DIAGNOSIS — A419 Sepsis, unspecified organism: Principal | ICD-10-CM | POA: Diagnosis present

## 2023-05-02 DIAGNOSIS — Z7901 Long term (current) use of anticoagulants: Secondary | ICD-10-CM

## 2023-05-02 DIAGNOSIS — R739 Hyperglycemia, unspecified: Secondary | ICD-10-CM | POA: Diagnosis present

## 2023-05-02 DIAGNOSIS — N179 Acute kidney failure, unspecified: Secondary | ICD-10-CM | POA: Diagnosis not present

## 2023-05-02 DIAGNOSIS — I480 Paroxysmal atrial fibrillation: Secondary | ICD-10-CM

## 2023-05-02 DIAGNOSIS — R41841 Cognitive communication deficit: Secondary | ICD-10-CM | POA: Diagnosis not present

## 2023-05-02 DIAGNOSIS — E785 Hyperlipidemia, unspecified: Secondary | ICD-10-CM | POA: Diagnosis not present

## 2023-05-02 DIAGNOSIS — R6881 Early satiety: Secondary | ICD-10-CM | POA: Diagnosis present

## 2023-05-02 DIAGNOSIS — R262 Difficulty in walking, not elsewhere classified: Secondary | ICD-10-CM | POA: Diagnosis not present

## 2023-05-02 DIAGNOSIS — R269 Unspecified abnormalities of gait and mobility: Secondary | ICD-10-CM | POA: Diagnosis not present

## 2023-05-02 DIAGNOSIS — D75839 Thrombocytosis, unspecified: Secondary | ICD-10-CM | POA: Diagnosis present

## 2023-05-02 DIAGNOSIS — E871 Hypo-osmolality and hyponatremia: Secondary | ICD-10-CM | POA: Diagnosis present

## 2023-05-02 DIAGNOSIS — L89152 Pressure ulcer of sacral region, stage 2: Secondary | ICD-10-CM | POA: Diagnosis present

## 2023-05-02 DIAGNOSIS — R52 Pain, unspecified: Secondary | ICD-10-CM | POA: Diagnosis not present

## 2023-05-02 DIAGNOSIS — Z79899 Other long term (current) drug therapy: Secondary | ICD-10-CM

## 2023-05-02 DIAGNOSIS — Z1611 Resistance to penicillins: Secondary | ICD-10-CM | POA: Diagnosis present

## 2023-05-02 DIAGNOSIS — B9620 Unspecified Escherichia coli [E. coli] as the cause of diseases classified elsewhere: Secondary | ICD-10-CM | POA: Diagnosis not present

## 2023-05-02 DIAGNOSIS — L8990 Pressure ulcer of unspecified site, unspecified stage: Secondary | ICD-10-CM | POA: Diagnosis present

## 2023-05-02 DIAGNOSIS — R9389 Abnormal findings on diagnostic imaging of other specified body structures: Secondary | ICD-10-CM | POA: Diagnosis not present

## 2023-05-02 DIAGNOSIS — M6281 Muscle weakness (generalized): Secondary | ICD-10-CM | POA: Diagnosis not present

## 2023-05-02 DIAGNOSIS — R918 Other nonspecific abnormal finding of lung field: Secondary | ICD-10-CM | POA: Diagnosis not present

## 2023-05-02 DIAGNOSIS — I2510 Atherosclerotic heart disease of native coronary artery without angina pectoris: Secondary | ICD-10-CM | POA: Diagnosis present

## 2023-05-02 DIAGNOSIS — R112 Nausea with vomiting, unspecified: Secondary | ICD-10-CM | POA: Diagnosis present

## 2023-05-02 DIAGNOSIS — F32A Depression, unspecified: Secondary | ICD-10-CM | POA: Diagnosis present

## 2023-05-02 DIAGNOSIS — Z880 Allergy status to penicillin: Secondary | ICD-10-CM

## 2023-05-02 DIAGNOSIS — K589 Irritable bowel syndrome without diarrhea: Secondary | ICD-10-CM | POA: Diagnosis present

## 2023-05-02 DIAGNOSIS — I4891 Unspecified atrial fibrillation: Secondary | ICD-10-CM | POA: Diagnosis not present

## 2023-05-02 DIAGNOSIS — R197 Diarrhea, unspecified: Secondary | ICD-10-CM | POA: Diagnosis not present

## 2023-05-02 DIAGNOSIS — R002 Palpitations: Secondary | ICD-10-CM | POA: Diagnosis present

## 2023-05-02 DIAGNOSIS — R2681 Unsteadiness on feet: Secondary | ICD-10-CM | POA: Diagnosis not present

## 2023-05-02 DIAGNOSIS — N390 Urinary tract infection, site not specified: Secondary | ICD-10-CM | POA: Diagnosis not present

## 2023-05-02 DIAGNOSIS — R531 Weakness: Secondary | ICD-10-CM | POA: Diagnosis not present

## 2023-05-02 DIAGNOSIS — Z6824 Body mass index (BMI) 24.0-24.9, adult: Secondary | ICD-10-CM

## 2023-05-02 DIAGNOSIS — I493 Ventricular premature depolarization: Secondary | ICD-10-CM | POA: Diagnosis not present

## 2023-05-02 DIAGNOSIS — J449 Chronic obstructive pulmonary disease, unspecified: Secondary | ICD-10-CM | POA: Diagnosis not present

## 2023-05-02 DIAGNOSIS — Z9181 History of falling: Secondary | ICD-10-CM | POA: Diagnosis not present

## 2023-05-02 DIAGNOSIS — E860 Dehydration: Secondary | ICD-10-CM | POA: Diagnosis present

## 2023-05-02 DIAGNOSIS — Z90710 Acquired absence of both cervix and uterus: Secondary | ICD-10-CM

## 2023-05-02 DIAGNOSIS — R1311 Dysphagia, oral phase: Secondary | ICD-10-CM | POA: Diagnosis not present

## 2023-05-02 DIAGNOSIS — R0989 Other specified symptoms and signs involving the circulatory and respiratory systems: Secondary | ICD-10-CM | POA: Diagnosis not present

## 2023-05-02 LAB — CBC
HCT: 36.4 % (ref 36.0–46.0)
Hemoglobin: 13 g/dL (ref 12.0–15.0)
MCH: 31.7 pg (ref 26.0–34.0)
MCHC: 35.7 g/dL (ref 30.0–36.0)
MCV: 88.8 fL (ref 80.0–100.0)
Platelets: 510 10*3/uL — ABNORMAL HIGH (ref 150–400)
RBC: 4.1 MIL/uL (ref 3.87–5.11)
RDW: 13.1 % (ref 11.5–15.5)
WBC: 19.9 10*3/uL — ABNORMAL HIGH (ref 4.0–10.5)
nRBC: 0 % (ref 0.0–0.2)

## 2023-05-02 LAB — BASIC METABOLIC PANEL WITH GFR
Anion gap: 9 (ref 5–15)
BUN: 26 mg/dL — ABNORMAL HIGH (ref 8–23)
CO2: 21 mmol/L — ABNORMAL LOW (ref 22–32)
Calcium: 7.8 mg/dL — ABNORMAL LOW (ref 8.9–10.3)
Chloride: 100 mmol/L (ref 98–111)
Creatinine, Ser: 0.75 mg/dL (ref 0.44–1.00)
GFR, Estimated: >60 mL/min
Glucose, Bld: 97 mg/dL (ref 70–99)
Potassium: 3.7 mmol/L (ref 3.5–5.1)
Sodium: 130 mmol/L — ABNORMAL LOW (ref 135–145)

## 2023-05-02 LAB — URINALYSIS, ROUTINE W REFLEX MICROSCOPIC
Bilirubin Urine: NEGATIVE
Glucose, UA: NEGATIVE mg/dL
Ketones, ur: NEGATIVE mg/dL
Nitrite: NEGATIVE
Protein, ur: 30 mg/dL — AB
Specific Gravity, Urine: 1.014 (ref 1.005–1.030)
WBC, UA: >50 WBC/hpf (ref 0–5)
pH: 6 (ref 5.0–8.0)

## 2023-05-02 LAB — BLOOD GAS, VENOUS
Acid-Base Excess: 1.3 mmol/L (ref 0.0–2.0)
Bicarbonate: 25 mmol/L (ref 20.0–28.0)
O2 Saturation: 79.4 %
Patient temperature: 37
pCO2, Ven: 36 mm[Hg] — ABNORMAL LOW (ref 44–60)
pH, Ven: 7.45 — ABNORMAL HIGH (ref 7.25–7.43)
pO2, Ven: 48 mm[Hg] — ABNORMAL HIGH (ref 32–45)

## 2023-05-02 LAB — COMPREHENSIVE METABOLIC PANEL
ALT: 33 U/L (ref 0–44)
AST: 46 U/L — ABNORMAL HIGH (ref 15–41)
Albumin: 3.2 g/dL — ABNORMAL LOW (ref 3.5–5.0)
Alkaline Phosphatase: 102 U/L (ref 38–126)
Anion gap: 16 — ABNORMAL HIGH (ref 5–15)
BUN: 37 mg/dL — ABNORMAL HIGH (ref 8–23)
CO2: 21 mmol/L — ABNORMAL LOW (ref 22–32)
Calcium: 9.1 mg/dL (ref 8.9–10.3)
Chloride: 89 mmol/L — ABNORMAL LOW (ref 98–111)
Creatinine, Ser: 1.27 mg/dL — ABNORMAL HIGH (ref 0.44–1.00)
GFR, Estimated: 46 mL/min — ABNORMAL LOW (ref >60)
Glucose, Bld: 215 mg/dL — ABNORMAL HIGH (ref 70–99)
Potassium: 2.7 mmol/L — CL (ref 3.5–5.1)
Sodium: 126 mmol/L — ABNORMAL LOW (ref 135–145)
Total Bilirubin: 1.1 mg/dL (ref <1.2)
Total Protein: 6.7 g/dL (ref 6.5–8.1)

## 2023-05-02 LAB — LIPASE, BLOOD: Lipase: 59 U/L — ABNORMAL HIGH (ref 11–51)

## 2023-05-02 LAB — TSH: TSH: 2.753 u[IU]/mL (ref 0.350–4.500)

## 2023-05-02 LAB — MAGNESIUM: Magnesium: 1.9 mg/dL (ref 1.7–2.4)

## 2023-05-02 LAB — LACTIC ACID, PLASMA
Lactic Acid, Venous: 3.5 mmol/L (ref 0.5–1.9)
Lactic Acid, Venous: 1.8 mmol/L (ref 0.5–1.9)

## 2023-05-02 LAB — PHOSPHORUS: Phosphorus: 2.4 mg/dL — ABNORMAL LOW (ref 2.5–4.6)

## 2023-05-02 MED ORDER — ACETAMINOPHEN 325 MG PO TABS
650.0000 mg | ORAL_TABLET | Freq: Four times a day (QID) | ORAL | Status: DC | PRN
  Administered 2023-05-05: 650 mg via ORAL
  Filled 2023-05-02: qty 2

## 2023-05-02 MED ORDER — HEPARIN SODIUM (PORCINE) 5000 UNIT/ML IJ SOLN: 5000.0000 [IU] | Freq: Two times a day (BID) | INTRAMUSCULAR | Status: DC

## 2023-05-02 MED ORDER — ONDANSETRON HCL 4 MG/2ML IJ SOLN: 4.0000 mg | Freq: Four times a day (QID) | INTRAMUSCULAR | Status: DC | PRN

## 2023-05-02 MED ORDER — METOPROLOL TARTRATE 5 MG/5ML IV SOLN: 5.0000 mg | INTRAVENOUS | Status: DC | PRN

## 2023-05-02 MED ORDER — RISPERIDONE 0.5 MG PO TABS
0.5000 mg | ORAL_TABLET | Freq: Every day | ORAL | Status: DC
Start: 2023-05-02 — End: 2023-05-06
  Administered 2023-05-02 – 2023-05-05 (×4): 0.5 mg via ORAL
  Filled 2023-05-02 (×4): qty 1

## 2023-05-02 MED ORDER — FLECAINIDE ACETATE 50 MG PO TABS
50.0000 mg | ORAL_TABLET | Freq: Two times a day (BID) | ORAL | Status: DC
  Administered 2023-05-02 – 2023-05-06 (×8): 50 mg via ORAL
  Filled 2023-05-02 (×8): qty 1

## 2023-05-02 MED ORDER — MAGNESIUM SULFATE IN D5W 1-5 GM/100ML-% IV SOLN
1.0000 g | Freq: Once | INTRAVENOUS | Status: AC
  Administered 2023-05-02: 1 g via INTRAVENOUS
  Filled 2023-05-02 (×2): qty 100

## 2023-05-02 MED ORDER — RIVAROXABAN 15 MG PO TABS
15.0000 mg | ORAL_TABLET | Freq: Every day | ORAL | Status: DC
  Administered 2023-05-02: 15 mg via ORAL
  Filled 2023-05-02 (×2): qty 1

## 2023-05-02 MED ORDER — SODIUM CHLORIDE 0.9 % IV SOLN: Freq: Once | INTRAVENOUS | Status: AC

## 2023-05-02 MED ORDER — METOPROLOL TARTRATE 50 MG PO TABS
50.0000 mg | ORAL_TABLET | Freq: Two times a day (BID) | ORAL | Status: DC
  Administered 2023-05-02 – 2023-05-06 (×8): 50 mg via ORAL
  Filled 2023-05-02 (×2): qty 2
  Filled 2023-05-02 (×6): qty 1

## 2023-05-02 MED ORDER — DILTIAZEM HCL-DEXTROSE 125-5 MG/125ML-% IV SOLN (PREMIX)
5.0000 mg/h | INTRAVENOUS | Status: AC
  Administered 2023-05-02: 5 mg/h via INTRAVENOUS
  Filled 2023-05-02: qty 125

## 2023-05-02 MED ORDER — POTASSIUM CHLORIDE CRYS ER 20 MEQ PO TBCR
40.0000 meq | EXTENDED_RELEASE_TABLET | Freq: Once | ORAL | Status: AC
  Administered 2023-05-02: 40 meq via ORAL
  Filled 2023-05-02: qty 2

## 2023-05-02 MED ORDER — SODIUM CHLORIDE 0.9 % IV BOLUS
1000.0000 mL | Freq: Once | INTRAVENOUS | Status: AC
  Administered 2023-05-02: 1000 mL via INTRAVENOUS

## 2023-05-02 MED ORDER — DILTIAZEM HCL 25 MG/5ML IV SOLN
15.0000 mg | Freq: Once | INTRAVENOUS | Status: AC
  Administered 2023-05-02: 15 mg via INTRAVENOUS
  Filled 2023-05-02: qty 5

## 2023-05-02 MED ORDER — POTASSIUM CHLORIDE 10 MEQ/100ML IV SOLN
10.0000 meq | INTRAVENOUS | Status: DC
  Administered 2023-05-02 (×2): 10 meq via INTRAVENOUS
  Filled 2023-05-02 (×2): qty 100

## 2023-05-02 MED ORDER — DULOXETINE HCL 30 MG PO CPEP
60.0000 mg | ORAL_CAPSULE | Freq: Every day | ORAL | Status: DC
  Administered 2023-05-02 – 2023-05-06 (×5): 60 mg via ORAL
  Filled 2023-05-02: qty 1
  Filled 2023-05-02 (×3): qty 2
  Filled 2023-05-02: qty 1

## 2023-05-02 MED ORDER — ONDANSETRON HCL 4 MG PO TABS
4.0000 mg | ORAL_TABLET | Freq: Four times a day (QID) | ORAL | Status: DC | PRN
  Administered 2023-05-03: 4 mg via ORAL
  Filled 2023-05-02: qty 1

## 2023-05-02 MED ORDER — POTASSIUM CHLORIDE IN NACL 40-0.9 MEQ/L-% IV SOLN
INTRAVENOUS | Status: AC
  Administered 2023-05-02: 100 mL/h via INTRAVENOUS
  Filled 2023-05-02: qty 1000

## 2023-05-02 MED ORDER — MAGNESIUM SULFATE 50 % IJ SOLN: 1.0000 g | Freq: Once | INTRAMUSCULAR | Status: DC

## 2023-05-02 NOTE — H&P (Addendum)
History and Physical    [name] WGN:[id number] DOB: 1956-03-10 DOA: 05/02/2023  PCP: Dorothey Baseman, MD (Confirm with patient/family/NH records and if not entered, this has to be entered at [facility] point of entry) Patient coming from: Home  I have personally briefly reviewed patient's old medical records in [facility]  Chief Complaint: Feeling weak, palpitations  HPI: Holly Bowen is a 67 y.o. 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  Symptoms started 2 weeks ago with sudden onset of nauseous vomiting and loose diarrhea.  Nauseous vomiting appears to be improving however the loose diarrhea continues for last 2 weeks and last BM was yesterday.  Denied any abdominal pain.  She does admitted that she had occasional subjective fever and chills.  And since last week she has had significant decrease of appetite and oral intake.  Gradually she has been feeling very weak and started to have palpitations for the last 3 to 4 days.  Unable to get out of bed for she has been feeling so weak and eventually she was found by her friend this morning and who sent her to ED.  ED Course: Was found to be in rapid A-fib heart rate in the 130-150s, blood pressure 94/68.  Not hypoxic.  Blood work showed sodium 126, K2.7, chloride 89, bicarb 21, creatinine 1.2 compared to baseline 0.6-3.8, WBC 19.9, hemoglobin 13.  Was given IV bolus x 1 and started on Cardizem drip  Review of Systems: As per HPI otherwise 14 point review of systems negative.    Past Medical History:  Diagnosis Date   Anxiety    Depression    Dyspnea    hx clot in lung, SOB with exertion   Hypertension    IBS (irritable bowel syndrome)    PONV (postoperative nausea and vomiting)    Postsplenectomy thrombocytosis    Restrictive lung  disease     Past Surgical History:  Procedure Laterality Date   ABDOMINAL HYSTERECTOMY     BREAST BIOPSY Right    neg   CERVICAL FUSION  2017   COLONOSCOPY WITH PROPOFOL N/A 02/22/2018   Procedure: COLONOSCOPY WITH PROPOFOL;  Surgeon: Toledo, Boykin Nearing, MD;  Location: [facility] ENDOSCOPY;  Service: Gastroenterology;  Laterality: N/A;   ESOPHAGOGASTRODUODENOSCOPY     ESOPHAGOGASTRODUODENOSCOPY N/A 02/22/2018   Procedure: ESOPHAGOGASTRODUODENOSCOPY (EGD);  Surgeon: Toledo, Boykin Nearing, MD;  Location: [facility] ENDOSCOPY;  Service: Gastroenterology;  Laterality: N/A;   POSTERIOR CERVICAL FUSION/FORAMINOTOMY N/A 11/14/2019   Procedure: CERVICAL 4 - CERVICAL 7 POSTERIOR SPINAL FUSION WITH INSTRUMENTATION AND ALLOGRAFT;  Surgeon: Estill Bamberg, MD;  Location: [facility] OR;  Service: Orthopedics;  Laterality: N/A;   spleen removed  2015     reports that she has never smoked. She has never used smokeless tobacco. She reports that she does not drink alcohol and does not use drugs.  Allergies  Allergen Reactions   Penicillins Anaphylaxis    Tongue and throat swelling    Family History  Problem Relation Age of Onset   Breast cancer Neg Hx     Prior to Admission medications   Medication Sig Start Date End Date Taking? Authorizing Provider  acetaminophen (TYLENOL) 325 MG tablet Take 650 mg by mouth every 6 (six) hours as needed for moderate pain.  04/12/16   [provider]  busPIRone (BUSPAR) 30 MG tablet Take 30 mg by mouth  daily.     [provider]  Cyanocobalamin 1000 MCG/ML LIQD Take 1 drop by mouth daily.  02/15/14   [provider]  cyclobenzaprine (FLEXERIL) 5 MG tablet Take 1 tablet (5 mg total) by mouth 3 (three) times daily. 11/15/19   Estill Bamberg, MD  DULoxetine (CYMBALTA) 60 MG capsule Take 60 mg by mouth daily. 04/03/19 04/13/20  [provider]  lisinopril-hydrochlorothiazide (ZESTORETIC) 20-25 MG tablet Take 1 tablet by mouth daily.    [provider]  methylPREDNISolone (MEDROL DOSEPAK) 4 MG TBPK tablet Take 6 pills on day one then decrease by 1 pill each day 04/10/23   Faythe Ghee, PA-C  metoprolol tartrate (LOPRESSOR) 100 MG tablet Take 100 mg by mouth 2 (two) times daily.  02/14/14   [provider]  ondansetron (ZOFRAN-ODT) 4 MG disintegrating tablet Take 4 mg by mouth every 8 (eight) hours as needed for nausea/vomiting. 10/30/19   [provider]  potassium chloride (KLOR-CON) 10 MEQ tablet Take 1 tablet (10 mEq total) by mouth 2 (two) times daily for 5 days. 07/28/22 08/02/22  Dionne Bucy, MD  risperiDONE (RISPERDAL) 0.25 MG tablet Take 0.5 mg by mouth at bedtime.  02/26/14 10/31/19  [provider]    Physical Exam: Vitals:   05/02/23 1232 05/02/23 1235  BP: 94/68   Pulse: 91   Resp: 17   Temp: 97.9 F (36.6 C)   TempSrc: Oral   SpO2: 96%   Weight:  68 kg  Height:  5\' 5"  (1.651 m)    Constitutional: NAD, calm, comfortable Vitals:   05/02/23 1232 05/02/23 1235  BP: 94/68   Pulse: 91   Resp: 17   Temp: 97.9 F (36.6 C)   TempSrc: Oral   SpO2: 96%   Weight:  68 kg  Height:  5\' 5"  (1.651 m)   Eyes: PERRL, lids and conjunctivae normal ENMT: Mucous membranes are dry. Posterior pharynx clear of any exudate or lesions.Normal dentition.  Neck: normal, supple, no masses, no thyromegaly Respiratory: clear to auscultation bilaterally, no wheezing, no crackles. Normal respiratory effort. No accessory muscle use.  Cardiovascular: Irregular heart rate, no murmurs / rubs / gallops. No extremity edema. 2+ pedal pulses. No carotid bruits.  Abdomen: no tenderness, no masses palpated. No hepatosplenomegaly. Bowel sounds positive.  Musculoskeletal: no clubbing / cyanosis. No joint deformity upper and lower extremities. Good ROM, no contractures. Normal muscle tone.  Skin: no rashes, lesions, ulcers. No induration Neurologic: CN 2-12 grossly intact. Sensation intact, DTR normal. Strength 5/5 in all 4.   Psychiatric: Normal judgment and insight. Alert and oriented x 3. Normal mood.     Labs on Admission: I have personally reviewed following labs and imaging studies  CBC: Recent Labs  Lab 05/02/23 1236  WBC 19.9*  HGB 13.0  HCT 36.4  MCV 88.8  PLT 510*   Basic Metabolic Panel: Recent Labs  Lab 05/02/23 1236  NA 126*  K 2.7*  CL 89*  CO2 21*  GLUCOSE 215*  BUN 37*  CREATININE 1.27*  CALCIUM 9.1   GFR: Estimated Creatinine Clearance: 37.6 mL/min (A) (by C-G formula based on SCr of 1.27 mg/dL (H)). Liver Function Tests: Recent Labs  Lab 05/02/23 1236  AST 46*  ALT 33  ALKPHOS 102  BILITOT 1.1  PROT 6.7  ALBUMIN 3.2*   Recent Labs  Lab 05/02/23 1236  LIPASE 59*   No results for input(s): "AMMONIA" in the last 168 hours. Coagulation Profile: No results for input(s): "INR", "  PROTIME" in the last 168 hours. Cardiac Enzymes: No results for input(s): "CKTOTAL", "CKMB", "CKMBINDEX", "TROPONINI" in the last 168 hours. BNP (last 3 results) No results for input(s): "PROBNP" in the last 8760 hours. HbA1C: No results for input(s): "HGBA1C" in the last 72 hours. CBG: No results for input(s): "GLUCAP" in the last 168 hours. Lipid Profile: No results for input(s): "CHOL", "HDL", "LDLCALC", "TRIG", "CHOLHDL", "LDLDIRECT" in the last 72 hours. Thyroid Function Tests: No results for input(s): "TSH", "T4TOTAL", "FREET4", "T3FREE", "THYROIDAB" in the last 72 hours. Anemia Panel: No results for input(s): "VITAMINB12", "FOLATE", "FERRITIN", "TIBC", "IRON", "RETICCTPCT" in the last 72 hours. Urine analysis:    Component Value Date/Time   COLORURINE YELLOW 11/07/2019 1122   APPEARANCEUR CLEAR 11/07/2019 1122   LABSPEC 1.005 11/07/2019 1122   PHURINE 6.0 11/07/2019 1122   GLUCOSEU NEGATIVE 11/07/2019 1122   HGBUR NEGATIVE 11/07/2019 1122   BILIRUBINUR NEGATIVE 11/07/2019 1122   KETONESUR NEGATIVE 11/07/2019 1122   PROTEINUR NEGATIVE 11/07/2019 1122   UROBILINOGEN 4.0  (H) 10/17/2009 0844   NITRITE NEGATIVE 11/07/2019 1122   LEUKOCYTESUR NEGATIVE 11/07/2019 1122    Radiological Exams on Admission: No results found.  EKG: Independently reviewed.  A-fib with RVR, no acute ST changes.  Assessment/Plan Principal Problem:   Afib (HCC) Active Problems:   Hypokalemia   A-fib (HCC)  (please populate well all problems here in Problem List. (For example, if patient is on BP meds at home and you resume or decide to hold them, it is a problem that needs to be her. Same for CAD, COPD, HLD and so on)  A-fib with RVR -Likely secondary to profound electrolyte abnormalities -Clinically patient appears to to be volume contracted, will give her another 1000 mL of IV bolus -On Cardizem drip.  Monitor BP response. -Replenish K and 1 dose of magnesium given. -Check TSH -Text [facility] cardiology about when to re-start flecainide -CHADS2=1, patient on Xarelto for PE  Severe hypokalemia -Secondary to GI loss from nausea vomiting diarrhea -IV and p.o. replacement, recheck level tonight -1 dose of magnesium 1 g IV given  AKI -Volume contracted secondary to GI loss from repeated nauseous vomiting diarrhea for last 2 weeks -IV bolus x 2 -Recheck kidney function tomorrow  Hyponatremia -Hypovolemic, replenish volume then reevaluate  Hyperglycemia -No history of diabetes -Recheck glucose level in a.m.  Intractable nauseous vomiting diarrhea -Improved. -Agreed with GI pathogen study  SIRS -Evidenced by tachycardia leukocytosis, clinically the infectious source appear to be diminished.  GI pathogen pending.  Hold off antibiotics  Hx of PE -Renal adjusted Xarelto  Generalized weakness -Likely secondary to profound electrolyte abnormalities and severe dehydration and AKI.  Corresponding management as above -PT OT evaluation starting tomorrow  Restrictive lung disease -No acute concern  DVT prophylaxis: Xarelto Code Status: Full code Family Communication:  Close friend at bedside Disposition Plan: Patient is sick with profound electrolyte abnormalities and AKI requiring IV electrolyte replenishment and IV fluid, expect more than 2 midnight hospital stay Consults called: Cardiology [facility] Admission status: PCU   Emeline General MD [facility] Pager [phone]  05/02/2023, 3:04 PM

## 2023-05-02 NOTE — ED Notes (Signed)
Mepilex applied to sacral wound.

## 2023-05-02 NOTE — ED Notes (Signed)
See triage note  Presents with some n/v/d  States this started 2 weeks ago Afebrile on arrival

## 2023-05-02 NOTE — ED Triage Notes (Signed)
Pt arrives via EMS from home. Pt sts that she has not ate or drank for two weeks. 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 Pt sts that she has had N/V/D.

## 2023-05-02 NOTE — ED Provider Notes (Signed)
[facility] Provider Note    Event Date/Time   First MD Initiated Contact with Patient 05/02/23 1247     (approximate)   History   Abdominal Pain   HPI  Holly Bowen is a 67 y.o. female with a history of anxiety, depression, hypertension, irritable bowel syndrome who presents with complaints of diarrhea, weakness.  Patient reports she was having diarrhea last week, she became so weak that she was unable to get out of bed on her own.  She does report mild dysuria does not know if she has had fevers.  She reports diarrhea has improved.  She denies abdominal pain.  No vomiting     Physical Exam   Triage Vital Signs: ED Triage Vitals  Encounter Vitals Group     BP 05/02/23 1232 94/68     Systolic BP Percentile --      Diastolic BP Percentile --      Pulse Rate 05/02/23 1232 91     Resp 05/02/23 1232 17     Temp 05/02/23 1232 97.9 F (36.6 C)     Temp Source 05/02/23 1232 Oral     SpO2 05/02/23 1232 96 %     Weight 05/02/23 1235 68 kg (150 lb)     Height 05/02/23 1235 1.651 m (5\' 5" )     Head Circumference --      Peak Flow --      Pain Score 05/02/23 1235 0     Pain Loc --      Pain Education --      Exclude from Growth Chart --     Most recent vital signs: Vitals:   05/02/23 1232  BP: 94/68  Pulse: 91  Resp: 17  Temp: 97.9 F (36.6 C)  SpO2: 96%     General: Awake, no distress.  CV:  Good peripheral perfusion.  Resp:  Normal effort.  Clear to auscultation bilaterally, no cough Abd:  No distention.  Abdomen soft, nontender, no CVA tenderness Other:  No evidence of cellulitis to the backside, multiple abrasion like areas   ED Results / Procedures / Treatments   Labs (all labs ordered are listed, but only abnormal results are displayed) Labs Reviewed  LIPASE, BLOOD - Abnormal; Notable for the following components:      Result Value   Lipase 59 (*)    All other components within normal limits  COMPREHENSIVE METABOLIC PANEL -  Abnormal; Notable for the following components:   Sodium 126 (*)    Potassium 2.7 (*)    Chloride 89 (*)    CO2 21 (*)    Glucose, Bld 215 (*)    BUN 37 (*)    Creatinine, Ser 1.27 (*)    Albumin 3.2 (*)    AST 46 (*)    GFR, Estimated 46 (*)    Anion gap 16 (*)    All other components within normal limits  CBC - Abnormal; Notable for the following components:   WBC 19.9 (*)    Platelets 510 (*)    All other components within normal limits  C DIFFICILE QUICK SCREEN W PCR REFLEX    CULTURE, BLOOD (ROUTINE X 2)  CULTURE, BLOOD (ROUTINE X 2)  URINE CULTURE  URINALYSIS, ROUTINE W REFLEX MICROSCOPIC  LACTIC ACID, PLASMA  LACTIC ACID, PLASMA     EKG  ED ECG REPORT I, Jene Every, the attending physician, personally viewed and interpreted this ECG.  Date: 05/02/2023  Rhythm: Atrial fibrillation  with RVR QRS Axis: normal Intervals: normal ST/T Wave abnormalities: normal Narrative Interpretation: Atrial relation    RADIOLOGY X-ray viewed interpret by me, low lung volumes, no clear pneumonia    PROCEDURES:  Critical Care performed: yes  CRITICAL CARE Performed by: Jene Every   Total critical care time: 30 minutes  Critical care time was exclusive of separately billable procedures and treating other patients.  Critical care was necessary to treat or prevent imminent or life-threatening deterioration.  Critical care was time spent personally by me on the following activities: development of treatment plan with patient and/or surrogate as well as nursing, discussions with consultants, evaluation of patient's response to treatment, examination of patient, obtaining history from patient or surrogate, ordering and performing treatments and interventions, ordering and review of laboratory studies, ordering and review of radiographic studies, pulse oximetry and re-evaluation of patient's condition.   Procedures   MEDICATIONS ORDERED IN ED: Medications   potassium chloride SA (KLOR-CON M) CR tablet 40 mEq (has no administration in time range)  diltiazem (CARDIZEM) injection 15 mg (has no administration in time range)  diltiazem (CARDIZEM) 125 mg in dextrose 5% 125 mL (1 mg/mL) infusion (has no administration in time range)  0.9 %  sodium chloride infusion ( Intravenous New Bag/Given 05/02/23 1355)     IMPRESSION / MDM / ASSESSMENT AND PLAN / ED COURSE  I reviewed the triage vital signs and the nursing notes. Patient's presentation is most consistent with acute presentation with potential threat to life or bodily function.  Patient presents with weakness as above, she has apparently been so weak she has not been able to get out of bed to clean herself from her diarrhea.  Although she reports her diarrhea has generally resolved, last episode several days ago.  Differential includes C. difficile diarrhea, sepsis, UTI, pneumonia  She is afebrile here, blood pressure is somewhat low for her but not hypotensive.  Notably her white blood cell count is elevated although this is relatively nonspecific, will add on lactic, blood cultures, chest x-ray, urinalysis  She is hypokalemic likely related to her copious diarrhea, will give IV fluids at this time while awaiting  ----------------------------------------- 2:50 PM on 05/02/2023 ----------------------------------------- EKG just obtained demonstrates atrial fibrillation with RVR, this could be because of her weakness as well, will give a dose of IV Cardizem, Cardizem infusion have discussed with the hospitalist for admission      FINAL CLINICAL IMPRESSION(S) / ED DIAGNOSES   Final diagnoses:  Diarrhea, unspecified type  Acute hyponatremia  Hypokalemia  Atrial fibrillation with rapid ventricular response (HCC)     Rx / DC Orders   ED Discharge Orders     None        Note:  This document was prepared using Dragon voice recognition software and may include unintentional  dictation errors.   Jene Every, MD 05/02/23 1450

## 2023-05-03 DIAGNOSIS — R531 Weakness: Secondary | ICD-10-CM

## 2023-05-03 DIAGNOSIS — Z86711 Personal history of pulmonary embolism: Secondary | ICD-10-CM | POA: Diagnosis present

## 2023-05-03 DIAGNOSIS — R112 Nausea with vomiting, unspecified: Secondary | ICD-10-CM | POA: Diagnosis present

## 2023-05-03 DIAGNOSIS — E871 Hypo-osmolality and hyponatremia: Secondary | ICD-10-CM | POA: Diagnosis present

## 2023-05-03 DIAGNOSIS — I4891 Unspecified atrial fibrillation: Secondary | ICD-10-CM | POA: Diagnosis not present

## 2023-05-03 DIAGNOSIS — E860 Dehydration: Secondary | ICD-10-CM | POA: Diagnosis present

## 2023-05-03 DIAGNOSIS — A419 Sepsis, unspecified organism: Secondary | ICD-10-CM | POA: Diagnosis present

## 2023-05-03 DIAGNOSIS — L8990 Pressure ulcer of unspecified site, unspecified stage: Secondary | ICD-10-CM | POA: Diagnosis present

## 2023-05-03 LAB — BLOOD CULTURE ID PANEL (REFLEXED) - BCID2

## 2023-05-03 LAB — BASIC METABOLIC PANEL
Anion gap: 8 (ref 5–15)
BUN: 23 mg/dL (ref 8–23)
CO2: 19 mmol/L — ABNORMAL LOW (ref 22–32)
Calcium: 7.9 mg/dL — ABNORMAL LOW (ref 8.9–10.3)
Chloride: 107 mmol/L (ref 98–111)
Creatinine, Ser: 0.78 mg/dL (ref 0.44–1.00)
GFR, Estimated: >60 mL/min (ref >60)
Glucose, Bld: 104 mg/dL — ABNORMAL HIGH (ref 70–99)
Potassium: 3.8 mmol/L (ref 3.5–5.1)
Sodium: 132 mmol/L — ABNORMAL LOW (ref 135–145)

## 2023-05-03 LAB — CBC
HCT: 29.4 % — ABNORMAL LOW (ref 36.0–46.0)
Hemoglobin: 10.1 g/dL — ABNORMAL LOW (ref 12.0–15.0)
MCH: 32.1 pg (ref 26.0–34.0)
MCHC: 34.4 g/dL (ref 30.0–36.0)
MCV: 93.3 fL (ref 80.0–100.0)
Platelets: 422 10*3/uL — ABNORMAL HIGH (ref 150–400)
RBC: 3.15 MIL/uL — ABNORMAL LOW (ref 3.87–5.11)
RDW: 14.1 % (ref 11.5–15.5)
WBC: 21.7 10*3/uL — ABNORMAL HIGH (ref 4.0–10.5)
nRBC: 0 % (ref 0.0–0.2)

## 2023-05-03 MED ORDER — SODIUM CHLORIDE 0.9 % IV SOLN
2.0000 g | INTRAVENOUS | Status: AC
  Administered 2023-05-03 – 2023-05-05 (×3): 2 g via INTRAVENOUS
  Filled 2023-05-03 (×3): qty 20

## 2023-05-03 MED ORDER — MAGNESIUM SULFATE 2 GM/50ML IV SOLN
2.0000 g | Freq: Once | INTRAVENOUS | Status: AC
  Administered 2023-05-03: 2 g via INTRAVENOUS
  Filled 2023-05-03: qty 50

## 2023-05-03 MED ORDER — DILTIAZEM HCL 30 MG PO TABS
60.0000 mg | ORAL_TABLET | Freq: Three times a day (TID) | ORAL | Status: DC
  Administered 2023-05-03 – 2023-05-04 (×2): 60 mg via ORAL
  Filled 2023-05-03 (×2): qty 2

## 2023-05-03 MED ORDER — SULFAMETHOXAZOLE-TRIMETHOPRIM 400-80 MG/5ML IV SOLN
15.0000 mg/kg/d | Freq: Three times a day (TID) | INTRAVENOUS | Status: DC
  Filled 2023-05-03 (×2): qty 21.25

## 2023-05-03 MED ORDER — DILTIAZEM HCL 60 MG PO TABS
60.0000 mg | ORAL_TABLET | Freq: Three times a day (TID) | ORAL | Status: DC
  Administered 2023-05-03: 60 mg via ORAL
  Filled 2023-05-03: qty 1

## 2023-05-03 MED ORDER — RIVAROXABAN 20 MG PO TABS
20.0000 mg | ORAL_TABLET | Freq: Every day | ORAL | Status: DC
  Administered 2023-05-03 – 2023-05-05 (×3): 20 mg via ORAL
  Filled 2023-05-03 (×3): qty 1

## 2023-05-03 NOTE — Evaluation (Signed)
Physical Therapy Evaluation Patient Details Name: Holly Bowen MRN: [id number] DOB: 1955-10-17 Today's Date: 05/03/2023  History of Present Illness  Holly Bowen is a 67 y.o. 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  Clinical Impression  Pt is a pleasant 67 year old female who was admitted for Afib with RVR. HR ranging from mid 80s to 106bpm at highest. Pt performs bed mobility, transfers, and ambulation with min assist and HHA. Anticipate pt will need to use RW for longer distance ambulation. Pt demonstrates deficits with strength/confidence/mobility. Would benefit from skilled PT to address above deficits and promote optimal return to PLOF. Pt will continue to receive skilled PT services while admitted and will defer to TOC/care team for updates regarding disposition planning.       If plan is discharge home, recommend the following: A little help with walking and/or transfers;A little help with bathing/dressing/bathroom   Can travel by private vehicle   Yes    Equipment Recommendations Rolling walker (2 wheels)  Recommendations for Other Services       Functional Status Assessment Patient has had a recent decline in their functional status and demonstrates the ability to make significant improvements in function in a reasonable and predictable amount of time.     Precautions / Restrictions Precautions Precautions: Fall Restrictions Weight Bearing Restrictions: No      Mobility  Bed Mobility Overal bed mobility: Needs Assistance Bed Mobility: Supine to Sit, Sit to Supine     Supine to sit: Min assist, Used rails, HOB elevated Sit to supine: Min assist, Used rails, HOB elevated   General bed mobility comments: needed physical assist and step by step instruction for bed  mobility. Once seated at EOB, pt reports light headedness. BP taken (109/54). Symptoms disapate with increased time.    Transfers Overall transfer level: Needs assistance Equipment used: 1 person hand held assist Transfers: Sit to/from Stand Sit to Stand: Min assist           General transfer comment: needs cues for initation. Once standing, upright posture and reaches out for B UE hand support.    Ambulation/Gait Ambulation/Gait assistance: Min assist Gait Distance (Feet): 4 Feet Assistive device: 1 person hand held assist Gait Pattern/deviations: Step-to pattern       General Gait Details: small side steps up towards HOB. Pt limited by strength/dizziness/fatigue. Unable to tolerate further ambulation at this time  Stairs            Wheelchair Mobility     Tilt Bed    Modified Rankin (Stroke Patients Only)       Balance Overall balance assessment: Needs assistance Sitting-balance support: No upper extremity supported, Feet supported Sitting balance-Leahy Scale: Fair     Standing balance support: During functional activity, Single extremity supported, Reliant on assistive device for balance Standing balance-Leahy Scale: Fair                               Pertinent Vitals/Pain Pain Assessment Pain Assessment: Faces Faces Pain Scale: Hurts little more Pain Location: Baseline back pain Pain Descriptors / Indicators: Aching, Constant Pain Intervention(s): Limited activity within patient's tolerance, Repositioned    Home Living Family/patient expects to be discharged to:: Private residence Living Arrangements: Alone Available Help at Discharge: Friend(s);Available PRN/intermittently;Family Type of Home: Mobile home  Home Access: Stairs to enter Entrance Stairs-Rails: Can reach both;Left;Right Entrance Stairs-Number of Steps: 5   Home Layout: One level Home Equipment: Rollator (4 wheels)      Prior Function Prior Level of Function :  Independent/Modified Independent;Driving             Mobility Comments: Independent, 1 fall 2 weeks PTA when feeling very weak from this illness. Reports she has required physical assistance in home from friends recently ADLs Comments: Independent. likes to go shopping and " do whatever I feel like doing".     Extremity/Trunk Assessment   Upper Extremity Assessment Upper Extremity Assessment: Defer to OT evaluation    Lower Extremity Assessment Lower Extremity Assessment: Generalized weakness (B LE grossly 3+/5 with giveaway weakness noted.)       Communication   Communication Communication: No apparent difficulties  Cognition Arousal: Alert Behavior During Therapy: WFL for tasks assessed/performed Overall Cognitive Status: Within Functional Limits for tasks assessed                                 General Comments: pt reluctant for evaluation reporting she just got comfortable. However with coaxing was agreeable to limited evaluation.        General Comments      Exercises Other Exercises Other Exercises: Pt educated on functional movement and safe exercise to assist in strength return. Ie. sitting up for meals and LE strengthening.   Assessment/Plan    PT Assessment Patient needs continued PT services  PT Problem List Decreased strength;Decreased activity tolerance;Decreased balance;Decreased mobility       PT Treatment Interventions DME instruction;Gait training;Therapeutic exercise;Balance training    PT Goals (Current goals can be found in the Care Plan section)  Acute Rehab PT Goals Patient Stated Goal: to go to rehab PT Goal Formulation: With patient Time For Goal Achievement: 05/17/23 Potential to Achieve Goals: Good    Frequency Min 1X/week     Co-evaluation               AM-PAC PT "6 Clicks" Mobility  Outcome Measure Help needed turning from your back to your side while in a flat bed without using bedrails?: A Little Help  needed moving from lying on your back to sitting on the side of a flat bed without using bedrails?: A Little Help needed moving to and from a bed to a chair (including a wheelchair)?: A Little Help needed standing up from a chair using your arms (e.g., wheelchair or bedside chair)?: A Little Help needed to walk in hospital room?: A Lot Help needed climbing 3-5 steps with a railing? : A Lot 6 Click Score: 16    End of Session Equipment Utilized During Treatment: Gait belt Activity Tolerance: Patient limited by fatigue Patient left: in bed;with bed alarm set (no recliner in room at time) Nurse Communication: Mobility status PT Visit Diagnosis: Unsteadiness on feet (R26.81);Muscle weakness (generalized) (M62.81);Difficulty in walking, not elsewhere classified (R26.2)    Time: 1601-0932 PT Time Calculation (min) (ACUTE ONLY): 23 min   Charges:   PT Evaluation $PT Eval Low Complexity: 1 Low PT Treatments $Therapeutic Activity: 8-22 mins PT General Charges $$ ACUTE PT VISIT: 1 Visit         Elizabeth Palau, PT, DPT, GCS [phone]   Ibrohim Simmers 05/03/2023, 2:25 PM

## 2023-05-03 NOTE — ED Notes (Signed)
Patient c/o mild nausea

## 2023-05-03 NOTE — ED Notes (Signed)
PT at bedside.

## 2023-05-03 NOTE — NC FL2 (Signed)
[place] MEDICAID FL2 LEVEL OF CARE FORM     IDENTIFICATION  Patient Name: Holly Bowen Birthdate: 02-Dec-1955 Sex: female Admission Date (Current Location): 05/02/2023  [facility] and [place] Number:  Chiropodist and Address:  [facility], [address], [place], [place] [address]      Provider Number: [id number]  Attending Physician Name and Address:  Pennie Banter, DO  Relative Name and Phone Number:  [place],Courtney Clearence Cheek)  [phone] (Mobile)    Current Level of Care: Hospital Recommended Level of Care: Skilled Nursing Facility Prior Approval Number:    Date Approved/Denied:   PASRR Number:    Discharge Plan:      Current Diagnoses: Patient Active Problem List   Diagnosis Date Noted   Hyponatremia 05/03/2023   Pressure injury of skin 05/03/2023   History of pulmonary embolism 05/03/2023   Sepsis secondary to UTI (HCC) 05/03/2023   Generalized weakness 05/03/2023   Dehydration 05/03/2023   Nausea vomiting and diarrhea 05/03/2023   Hypokalemia 05/02/2023   A-fib (HCC) 05/02/2023   Atrial fibrillation with RVR (HCC) 05/02/2023   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/14/2019   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   Breath shortness 11/27/2013   Chronic restrictive lung disease 11/27/2013   Laceration of spleen 11/27/2013   Injury of spleen 01/31/2013   Abdominal pain, left upper quadrant 09/08/2012   Esophagitis, reflux 08/26/2011   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/10/2010   Benign hypertension 12/10/2010   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    Orientation RESPIRATION BLADDER Height & Weight     Self, Time, Situation, Place  Normal   Weight: 150 lb (68 kg) Height:  5\' 5"  (165.1 cm)  BEHAVIORAL SYMPTOMS/MOOD NEUROLOGICAL BOWEL NUTRITION STATUS        Diet (heart)  AMBULATORY STATUS COMMUNICATION OF NEEDS Skin   Limited Assist Verbally Other (Comment) (stage 2 coccyx; scratch marks)                        Personal Care Assistance Level of Assistance  Bathing, Feeding, Dressing Bathing Assistance: Limited assistance Feeding assistance: Limited assistance Dressing Assistance: Limited assistance     Functional Limitations Info             SPECIAL CARE FACTORS FREQUENCY  PT (By licensed PT), OT (By licensed OT)     PT Frequency: 5 times per week OT Frequency: 5 times per week            Contractures      Additional Factors Info  Code Status, Allergies Code Status Info: full Allergies Info: Penicillins, Lisinopril           Current Medications (05/03/2023):  This is the current hospital active medication list Current Facility-Administered Medications  Medication Dose Route Frequency Provider Last Rate Last Admin   acetaminophen (TYLENOL) tablet 650 mg  650 mg Oral Q6H PRN Mikey College T, MD       cefTRIAXone (ROCEPHIN) 2 g in sodium chloride 0.9 % 100 mL IVPB  2 g Intravenous Q24H Esaw Grandchild A, DO   Stopped at 05/03/23 1231   diltiazem (CARDIZEM) tablet 60 mg  60 mg Oral Q8H Hudson, Caralyn, PA-C       DULoxetine (CYMBALTA) DR capsule 60 mg  60 mg Oral Daily Mikey College T, MD   60 mg at 05/03/23 0939   flecainide (TAMBOCOR) tablet 50 mg  50 mg Oral Q12H Emeline General, MD   50 mg at 05/03/23 [phone]  metoprolol tartrate (LOPRESSOR) injection 5 mg  5 mg Intravenous Q4H PRN Mikey College T, MD       metoprolol tartrate (LOPRESSOR) tablet 50 mg  50 mg Oral BID Mikey College T, MD   50 mg at 05/03/23 0939   ondansetron (ZOFRAN) tablet 4 mg  4 mg Oral Q6H PRN Mikey College T, MD   4 mg at 05/03/23 1610   Or   ondansetron (ZOFRAN) injection 4 mg  4 mg Intravenous Q6H PRN Mikey College T, MD       risperiDONE (RISPERDAL) tablet 0.5 mg  0.5 mg Oral QHS Mikey College T, MD   0.5 mg at 05/02/23 2200   rivaroxaban (XARELTO) tablet 20 mg  20 mg Oral Q supper Pennie Banter, DO         Discharge Medications: Please see discharge summary for a list of discharge  medications.  Relevant Imaging Results:  Relevant Lab Results:   Additional Information SS #:[id number]  Kora Groom E Zayvion Stailey, LCSW

## 2023-05-03 NOTE — Progress Notes (Signed)
Progress Note   Patient: Holly Bowen [id number]:[id number] DOB: 01/30/1956 DOA: 05/02/2023     1 DOS: the patient was seen and examined on 05/03/2023   Brief hospital course: HPI on admission 05/02/23: "Holly Bowen is a 67 y.o. 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 ..." See H&P for full HPI on admission & ED course.  Pt was admitted for further evaluation and management of A-fib with RVR.   Pt was started on Cardizem infusion for heart rate control.  Pt was subsequently found to have a UTI and started on empiric antibiotics pending culture results.   Assessment and Plan:   A-fib with RVR Suspect from dehydration and electrolyte derangements. Given IV fluids for volume resuscitation on admission --[facility] consulted  --Weaned off Cardizem drip --Continue home PO metoprolol 50 mg BID --Continue home flecainide --Started on PO Cardizem 60 mg TID --Continue Xarelto (pt on for Hx of PE) --Telemetry monitoring   Severe hypokalemia - K 2.7 on admission was replaced. Secondary to GI loss from nausea vomiting diarrhea K 3.8 this AM --Monitor BMP, Mg --Replace K and Mg as needed  AKI - Cr 1.27 on admission >> 0.75 after IV fluids Pre-renal etiology in setting of dehydration. Resolved Monitor BMP  Intractable nauseous vomiting diarrhea - Improved. --Follow pending C diff and GI panel --Anti-emetics PRN --resume maintenance IV fluids if recurrent vomiting, if unable to tolerate PO  Hyponatremia - Na 126 on admission >> 130 >> 132 after IV fluids --Stop fluids --Monitor BMP  Hyperglycemia - non-fasting glucose 215 on admission.  No known history of diabetes. Check A1c Monitor fasting glucose for now    SIRS - POA with tachycardia leukocytosis No source of infection, likely  from dehydration. --Hold off antibiotics --Following pending C diff and GI panel   Hx of PE --Continue Xarelto   Generalized weakness Due to profound electrolyte abnormalities and severe dehydration and AKI --PT OT evaluations --Fall precautions   Restrictive lung disease - chronic. No acute issues. --Monitor respiratory status       Subjective: Pt seen in ED holding for a bed this AM.  She reports feeling somewhat better today, still very weak.  Nausea improved at this time, had some breakfast but not very much.     Physical Exam: Vitals:   05/03/23 1145 05/03/23 1200 05/03/23 1300 05/03/23 1438  BP: (!) 104/59 90/65 (!) 106/53 (!) 95/55  Pulse:   83 83  Resp: 17 (!) 23 17 18   Temp:    98.3 F (36.8 C)  TempSrc:      SpO2:   94% 96%  Weight:      Height:    5\' 5"  (1.651 m)   General exam: awake, alert, no acute distress HEENT: moist mucus membranes, hearing grossly normal  Respiratory system: CTAB, no wheezes, rales or rhonchi, normal respiratory effort. Cardiovascular system: normal S1/S2, RRR, no JVD, murmurs, rubs, gallops, no pedal edema.   Gastrointestinal system: soft, NT, ND, no HSM felt, +bowel sounds. Central nervous system: A&O x 3. no gross focal neurologic deficits, normal speech Extremities: moves all, no edema, normal tone Skin: dry, intact, normal temperature Psychiatry: normal mood, congruent affect, judgement and insight appear normal   Data Reviewed:  Notable labs --  Na improved 132 K normalized 3.8 Bicarb 19 Glucose 104 Ca 7.9  WBC 21.7 Hbg 10.1  Platelets 422k   Family Communication: None present on rounds. Pt able to update.  Disposition: Status is: Inpatient Remains inpatient appropriate because: on IV therapies as above, ongoing evaluation   Planned Discharge Destination: Home    Time spent: 42 minutes  Author: Pennie Banter, DO 05/03/2023 4:24 PM  For on call review [email or web address].

## 2023-05-03 NOTE — ED Notes (Signed)
Pt repositioned in bed and placed in fowler's position so that she can eat meal tray.

## 2023-05-03 NOTE — TOC Initial Note (Addendum)
Transition of Care [facility]) - Initial/Assessment Note    Patient Details  Name: Holly Bowen MRN: [id number] Date of Birth: Jun 08, 1955  [facility]) CM/SW Contact:    Liliana Cline, LCSW Phone Number: 05/03/2023, 3:23 PM  Clinical Narrative:                 Spoke with patient regarding SNF rec.  Patient is from home alone. Patient has a rollator. PCP is Dr. Terance Hart. Patient is agreeable to SNF, she has been to [facility] in the past. CSW started SNF work up. Patient would not pull up in [facility] Must - called the Help Desk- patient is listed as Adelfa Koh in [facility] Must. PASRR PENDING. Info has been uploaded to [facility] Must.  Expected Discharge Plan: Skilled Nursing Facility Barriers to Discharge: Continued Medical Work up   Patient Goals and CMS Choice Patient states their goals for this hospitalization and ongoing recovery are:: STR CMS Medicare.gov Compare Post Acute Care list provided to:: Patient Choice offered to / list presented to : Patient      Expected Discharge Plan and Services       Living arrangements for the past 2 months: Single Family Home                                      Prior Living Arrangements/Services Living arrangements for the past 2 months: Single Family Home Lives with:: Self Patient language and need for interpreter reviewed:: Yes Do you feel safe going back to the place where you live?: Yes        Care giver support system in place?: No (comment) Current home services: DME Criminal Activity/Legal Involvement Pertinent to Current Situation/Hospitalization: No - Comment as needed  Activities of Daily Living      Permission Sought/Granted Permission sought to share information with : Facility Industrial/product designer granted to share information with : Yes, Verbal Permission Granted  Share Information with NAME: SNFs           Emotional Assessment       Orientation: : Oriented to Self, Oriented to Place,  Oriented to  Time, Oriented to Situation Alcohol / Substance Use: Not Applicable Psych Involvement: No (comment)  Admission diagnosis:  Acute hyponatremia [E87.1] Hypokalemia [E87.6] Afib (HCC) [I48.91] Atrial fibrillation with rapid ventricular response (HCC) [I48.91] Atrial fibrillation with RVR (HCC) [I48.91] Diarrhea, unspecified type [R19.7] Patient Active Problem List   Diagnosis Date Noted   Hyponatremia 05/03/2023   Pressure injury of skin 05/03/2023   History of pulmonary embolism 05/03/2023   Sepsis secondary to UTI (HCC) 05/03/2023   Generalized weakness 05/03/2023   Dehydration 05/03/2023   Nausea vomiting and diarrhea 05/03/2023   Hypokalemia 05/02/2023   A-fib (HCC) 05/02/2023   Atrial fibrillation with RVR (HCC) 05/02/2023   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/14/2019   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), [place] - [address] Jer[place]nn M[place]) [place] [address] Phone: [phone] Fax: [phone]     Social Determinants of  Health (SDOH) Social History: SDOH Screenings   Food Insecurity: No Food Insecurity (05/03/2023)  Housing: Low Risk  (05/03/2023)  Transportation Needs: No Transportation Needs (05/03/2023)  Utilities: Not At Risk (05/03/2023)  Financial Resource Strain: Patient Declined (09/24/2022)   Received from [facility], [facility]  Physical Activity: Insufficiently Active (01/25/2022)   Received from [facility], [facility]  Social Connections: Unknown (10/01/2021)    Received from [facility], [facility]  Stress: Patient Declined (01/25/2022)   Received from [facility], [facility]  Tobacco Use: Low Risk  (05/02/2023)  Recent Concern: Tobacco Use - High Risk (03/18/2023)   Received [facility]   SDOH Interventions:     Readmission Risk Interventions     No data to display

## 2023-05-03 NOTE — Progress Notes (Signed)
Pharmacy Antibiotic Note  Holly Bowen is a 67 y.o. female admitted on 05/02/2023 with UTI. Patient presented to ED with persistent nausea, vomiting, diarrhea, generalized weakness, and palpitations. Symptoms started 2 weeks ago. A UA was collected showing large leukocytes, many bacteria, and >50 WBC. Pharmacy has been consulted for Bactrim dosing.Patient has a history of anaphylaxis to penicillins and no prior use of cephalosporins. Flouroquinolones are also not an option at the moment given patient's prolonged QT. Patient has not received any antibiotic so far this hospitalization.  Lactic acid 3.5>1.8 WBC 19.9>21.7 Tmax 99 Bcx 1/4 gram positive cocci Ucx pending   Plan: - Start patient on Bactrim 5 mg/kg of trimethoprim component IV q8h - Continue to follow renal function and cultures daily - Transition to PO when appropriate   Height: 5\' 5"  (165.1 cm) Weight: 68 kg (150 lb) IBW/kg (Calculated) : 57  Temp (24hrs), Avg:98.3 F (36.8 C), Min:97.7 F (36.5 C), Max:99 F (37.2 C)  Recent Labs  Lab 05/02/23 1236 05/02/23 1337 05/02/23 1656 05/02/23 2203 05/03/23 0633  WBC 19.9*  --   --   --  21.7*  CREATININE 1.27*  --   --  0.75 0.78  LATICACIDVEN  --  3.5* 1.8  --   --     Estimated Creatinine Clearance: 59.7 mL/min (by C-G formula based on SCr of 0.78 mg/dL).    Allergies  Allergen Reactions   Penicillins Anaphylaxis    Tongue and throat swelling   Lisinopril Swelling    Antimicrobials this admission: 12/3 Bactrim >>    Dose adjustments this admission: None  Microbiology results: 12/2 BCx: 1/4 gram positive cocci 12/2 UCx: IP    Thank you for allowing pharmacy to be a part of this patient's care.  Merryl Hacker, PharmD Clinical Pharmacist 05/03/2023 9:47 AM

## 2023-05-03 NOTE — ED Notes (Signed)
Patient assisted to use bedside commode.  Loose stool noted.  Patient reports stool is mixed with urine.  Unable to obtain a sample at this time.

## 2023-05-03 NOTE — Consult Note (Signed)
[facility] CLINIC CARDIOLOGY CONSULT NOTE       Patient ID: Holly Bowen MRN: [id number] DOB/AGE: April 14, 1956 67 y.o.  Admit date: 05/02/2023 Referring Physician Dr. Esaw Grandchild Primary Physician Dorothey Baseman, MD  Primary Cardiologist Dr. Dorothyann Peng Reason for Consultation AF RVR  HPI: Holly Bowen is a 67 y.o. 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 Found to be in AF RVR. Cardiology was consulted for further evaluation.   Patient reports that roughly 2 weeks ago she began feeling extremely weak and fatigued.  States that she had been having issues with diarrhea and vomiting.  Ultimately she became so weak that she was unable to get out of bed.  She reports that then the last week she was going to go see her primary care but was unable to get an appointment.  Over the weekend she had more episodes of vomiting and diarrhea.  States that she was unable to walk through her house without becoming extremely weak and fatigued.  States that she fell and laid on her floor overnight over the weekend.  A friend checked on her yesterday and brought her to the ED for further evaluation.  Workup in the ED notable for creatinine 1.27, potassium 2.7, sodium 126, chloride 89, magnesium 1.9, hemoglobin 13.0, WBC 19.9.  Lactic acid elevated at 3.5.  UA with large leukocytes and many bacteria.  CXR with stable basilar atelectasis.  EKG demonstrated atrial fibrillation RVR with a rate of 154 bpm she was started on IV fluids, IV antibiotics, IV diltiazem in the ED.  At time of my evaluation this morning patient is resting comfortably in hospital bed.  States that overall she feels better today, continues to have issues with diarrhea.  States that she has noticed intermittent palpitations recently.  Also had dizziness prior to admission.  Endorses some  chest tightness last night which is now improved this a.m., also was experiencing shortness of breath when walking around which she attributes to her weakness.  States that when she would notice palpitations she would try to breathe through the symptoms.  Heart rate overall is controlled now and she feels her palpitation symptoms have improved.  Review of systems complete and found to be negative unless listed above    Past Medical History:  Diagnosis Date   Anxiety    Depression    Dyspnea    hx clot in lung, SOB with exertion   Hypertension    IBS (irritable bowel syndrome)    PONV (postoperative nausea and vomiting)    Postsplenectomy thrombocytosis    Restrictive lung disease     Past Surgical History:  Procedure Laterality Date   ABDOMINAL HYSTERECTOMY     BREAST BIOPSY Right    neg   CERVICAL FUSION  2017   COLONOSCOPY WITH PROPOFOL N/A 02/22/2018   Procedure: COLONOSCOPY WITH PROPOFOL;  Surgeon: Toledo, Boykin Nearing, MD;  Location: [facility] ENDOSCOPY;  Service: Gastroenterology;  Laterality: N/A;   ESOPHAGOGASTRODUODENOSCOPY     ESOPHAGOGASTRODUODENOSCOPY N/A 02/22/2018   Procedure: ESOPHAGOGASTRODUODENOSCOPY (EGD);  Surgeon: Toledo, Boykin Nearing, MD;  Location: [facility] ENDOSCOPY;  Service: Gastroenterology;  Laterality: N/A;   POSTERIOR CERVICAL FUSION/FORAMINOTOMY N/A 11/14/2019   Procedure: CERVICAL 4 - CERVICAL 7 POSTERIOR SPINAL FUSION WITH INSTRUMENTATION AND ALLOGRAFT;  Surgeon: Estill Bamberg, MD;  Location: [facility] OR;  Service: Orthopedics;  Laterality: N/A;   spleen  removed  2015    (Not in a hospital admission)  Social History   Socioeconomic History   Marital status: Single    Spouse name: Not on file   Number of children: Not on file   Years of education: Not on file   Highest education level: Not on file  Occupational History   Not on file  Tobacco Use   Smoking status: Never   Smokeless tobacco: Never  Vaping Use   Vaping status: Never Used  Substance and Sexual  Activity   Alcohol use: No   Drug use: No   Sexual activity: Yes    Birth control/protection: Surgical    Comment: Hysterectomy  Other Topics Concern   Not on file  Social History Narrative   ** Merged History Encounter **       Social Determinants of Health   Financial Resource Strain: Patient Declined (09/24/2022)   Received from [facility], [facility]   Overall Financial Resource Strain (CARDIA)    Difficulty of Paying Living Expenses: Patient declined  Food Insecurity: Patient Declined (09/24/2022)   Received from [facility], [facility]   Hunger Vital Sign    Worried About Running Out of Food in the Last Year: Patient declined    Ran Out of Food in the Last Year: Patient declined  Transportation Needs: Patient Declined (09/24/2022)   Received from [facility], [facility]   [facility] - Transportation    In the past 12 months, has lack of transportation kept you from medical appointments or from getting medications?: Patient declined    Lack of Transportation (Non-Medical): Patient declined  Physical Activity: Insufficiently Active (01/25/2022)   Received from [facility], [facility]   Exercise Vital Sign    Days of Exercise per Week: 4 days    Minutes of Exercise per Session: 30 min  Stress: Patient Declined (01/25/2022)   Received from [facility], [facility] - Occupational Stress Questionnaire    Feeling of Stress : Patient declined  Social Connections: Unknown (10/01/2021)   Received from [facility], [facility]   Social Network    Social Network: Not on file  Intimate Partner Violence: Unknown (02/08/2023)   Received from [facility]   HITS    Physically Hurt: Not on file    Insult or Talk Down To: Not on file    Threaten Physical Harm: Not on file    Scream or Curse: Not on file    Family History   Problem Relation Age of Onset   Breast cancer Neg Hx      Vitals:   05/03/23 0900 05/03/23 0939 05/03/23 0945 05/03/23 1114  BP:  102/63 102/63   Pulse: 60 (!) 108    Resp: 15  (!) 22   Temp:    98.1 F (36.7 C)  TempSrc:    Oral  SpO2: 96%     Weight:      Height:        PHYSICAL EXAM General: Ill appearing female, well nourished, in no acute distress sitting upright in hospital bed. HEENT: Normocephalic and atraumatic. Neck: No JVD.  Lungs: Normal respiratory effort on room air. Clear bilaterally to auscultation. No wheezes, crackles, rhonchi.  Heart: Irregularly irregular. Normal S1 and S2 without gallops or murmurs.  Abdomen: Non-distended appearing.  Msk: Normal strength and tone for age. Extremities: Warm and well perfused. No clubbing,  cyanosis. No edema.  Neuro: Alert and oriented X 3. Psych: Answers questions appropriately.   Labs: Basic Metabolic Panel: Recent Labs    05/02/23 1236 05/02/23 2203 05/03/23 0633  NA 126* 130* 132*  K 2.7* 3.7 3.8  CL 89* 100 107  CO2 21* 21* 19*  GLUCOSE 215* 97 104*  BUN 37* 26* 23  CREATININE 1.27* 0.75 0.78  CALCIUM 9.1 7.8* 7.9*  MG 1.9  --   --   PHOS 2.4*  --   --    Liver Function Tests: Recent Labs    05/02/23 1236  AST 46*  ALT 33  ALKPHOS 102  BILITOT 1.1  PROT 6.7  ALBUMIN 3.2*   Recent Labs    05/02/23 1236  LIPASE 59*   CBC: Recent Labs    05/02/23 1236 05/03/23 0633  WBC 19.9* 21.7*  HGB 13.0 10.1*  HCT 36.4 29.4*  MCV 88.8 93.3  PLT 510* 422*   Cardiac Enzymes: No results for input(s): "CKTOTAL", "CKMB", "CKMBINDEX", "TROPONINIHS" in the last 72 hours. BNP: No results for input(s): "BNP" in the last 72 hours. D-Dimer: No results for input(s): "DDIMER" in the last 72 hours. Hemoglobin A1C: No results for input(s): "HGBA1C" in the last 72 hours. Fasting Lipid Panel: No results for input(s): "CHOL", "HDL", "LDLCALC", "TRIG", "CHOLHDL", "LDLDIRECT" in the last 72 hours. Thyroid  Function Tests: Recent Labs    05/02/23 1236  TSH 2.753   Anemia Panel: No results for input(s): "VITAMINB12", "FOLATE", "FERRITIN", "TIBC", "IRON", "RETICCTPCT" in the last 72 hours.   Radiology: [facility] Chest Port 1 View  Result Date: 05/02/2023 CLINICAL DATA:  Weakness. EXAM: PORTABLE CHEST 1 VIEW COMPARISON:  11/07/2019. FINDINGS: Low lung volume. Redemonstration of elevated right hemidiaphragm. There is atelectasis/scarring at lung bases, similar to the prior study. There is nonspecific heterogeneous opacity in the relatively linear distribution overlying the left upper lung zone, which appears more pronounced than the prior radiograph. These may represent fibrosis/atelectasis however, correlate clinically to determine the need for further characterization with chest CT scan. Bilateral lung fields are otherwise clear. Bilateral costophrenic angles are clear. Stable cardio-mediastinal silhouette. Lower cervical spinal fixation hardware noted. No acute osseous abnormalities. The soft tissues are within normal limits. There are embolization coils overlying the left upper abdomen. IMPRESSION: *Redemonstration of atelectasis/scarring at the lung bases, similar to the prior study. *However, there is heterogeneous opacity overlying the left upper lung zone, which appears more pronounced than the prior radiograph. These may represent fibrosis/atelectasis; however, correlate clinically to determine the need for further characterization with chest CT scan. Electronically Signed   By: Jules Schick M.D.   On: 05/02/2023 15:41     TELEMETRY reviewed by me 05/03/2023: atrial fibrillation rate 90s  EKG reviewed by me: AF RVR rate 154 bpm (11/2), repeat today with AF rate 90  Data reviewed by me 05/03/2023: last 24h vitals tele labs imaging I/O ED provider note, admission H&P  Principal Problem:   Atrial fibrillation with RVR (HCC) Active Problems:   Clinical depression   Benign hypertension   Chronic  restrictive lung disease   Hypokalemia   Hyponatremia   Pressure injury of skin   History of pulmonary embolism   Sepsis secondary to UTI (HCC)   Generalized weakness   Dehydration   Nausea vomiting and diarrhea    ASSESSMENT AND PLAN:  Holly Bowen is a 67 y.o. 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 Found to be in AF RVR. Cardiology was consulted for further evaluation.   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 Started on IV diltiazem. Rates improved in the 90s this AM.  -Transition to po diltiazem 60 mg q8 hours.  -Continue metoprolol tartrate 50 mg bid.  -Continue xarelto 15 mg daily for stroke risk reduction.   # AKI # Hypokalemia Patient presenting with Cr of 1.27 (baseline 0.5-0.6) and K of 2.7, likely 2/2 to hypovolemia. Received electrolyte supplementation and IVF in the ED. Cr improved to 0.78 on AM labs. -Monitor and replenish electrolytes for a goal K >4, Mag >2   -Continue to monitor renal function closely.   # Intractable nausea/vomiting/diarrhea # Generalized weakness Patient with N/V/D over the last 2 weeks with associated weakness.  -Management per primary.  # Hx PE -Continue xarelto as above.  This patient's plan of care was discussed and created with Dr. Melton Alar and she is in agreement.  Signed: Gale Journey, PA-C  05/03/2023, 11:25 AM [facility]

## 2023-05-03 NOTE — Evaluation (Signed)
Occupational Therapy Evaluation Patient Details Name: Holly Bowen MRN: [id number] DOB: 12-06-1955 Today's Date: 05/03/2023   History of Present Illness Holly Bowen is a 67 y.o. 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   Clinical Impression   Holly Bowen was seen for OT evaluation this date. Prior to hospital admission, pt was generally independent with ADL/IADL management. 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 Pt lives alone in a mobile home with 5 STE at the front and 3 at the back. Pt presents to acute OT demonstrating impaired ADL performance and functional mobility 2/2 generalized weakness, decreased activity tolerance, limited balance, and pain with mobility (See OT problem list for additional functional deficits). Pt currently requires MIN A for bed mobility and STS t/fs. Anticipate Min-MOD A for more exertional ADL management including LB bathing and dressing.  Pt would benefit from skilled OT services to address noted impairments and functional limitations (see below for any additional details) in order to maximize safety and independence while minimizing falls risk and caregiver burden. Anticipate the need for follow up OT services upon acute hospital DC.        If plan is discharge home, recommend the following: A lot of help with walking and/or transfers;A lot of help with bathing/dressing/bathroom;Assistance with cooking/housework;Assist for transportation;Help with stairs or ramp for entrance    Functional Status Assessment  Patient has had a recent decline in their functional status and demonstrates the ability to make significant improvements in function in a reasonable and  predictable amount of time.  Equipment Recommendations  BSC/3in1    Recommendations for Other Services       Precautions / Restrictions Precautions Precautions: Fall Restrictions Weight Bearing Restrictions: No      Mobility Bed Mobility Overal bed mobility: Needs Assistance Bed Mobility: Supine to Sit, Sit to Supine     Supine to sit: Min assist, Used rails, HOB elevated Sit to supine: Min assist, Used rails, HOB elevated        Transfers Overall transfer level: Needs assistance Equipment used: 1 person hand held assist Transfers: Sit to/from Stand Sit to Stand: Min assist           General transfer comment: MIN A to lift off from bed in lowest position. MIN A to contact guard for side steps toward HOB. Pt expresses strong fear of falling.      Balance Overall balance assessment: Needs assistance Sitting-balance support: No upper extremity supported, Feet supported Sitting balance-Leahy Scale: Fair Sitting balance - Comments: Limited sitting tolerance.   Standing balance support: During functional activity, Single extremity supported, Reliant on assistive device for balance Standing balance-Leahy Scale: Fair                             ADL either performed or assessed with clinical judgement   ADL Overall ADL's : Needs assistance/impaired                                       General ADL Comments: MIN A to come to sit at EOB with increased time/effort. MIN A for STS from EOB  and to take small side steps toward [facility]. Pt endorses ligh theadedness with minimal activity this date that does not resolve as session progresses. Activity limited to maximize pt comfort/safety. Anticipate MIN-MOD A for LB ADL management from STS, toilet transfers, & peri-care. SET UP assist for UB ADL management including self feeding while in upright position in bed.     Vision Baseline Vision/History: 1 Wears glasses Ability to See in Adequate Light: 1  Impaired Patient Visual Report: No change from baseline       Perception         Praxis         Pertinent Vitals/Pain Pain Assessment Pain Assessment: Faces Faces Pain Scale: Hurts a little bit Pain Location: Baseline back pain Pain Descriptors / Indicators: Aching, Constant Pain Intervention(s): Limited activity within patient's tolerance, Monitored during session, Repositioned     Extremity/Trunk Assessment Upper Extremity Assessment Upper Extremity Assessment: Generalized weakness   Lower Extremity Assessment Lower Extremity Assessment: Generalized weakness   Cervical / Trunk Assessment Cervical / Trunk Assessment: Other exceptions (Hx of back pain from prior injury)   Communication Communication Communication: No apparent difficulties Cueing Techniques: Verbal cues   Cognition Arousal: Alert Behavior During Therapy: WFL for tasks assessed/performed Overall Cognitive Status: Within Functional Limits for tasks assessed                                 General Comments: Pleasant, conversational. Oriented to self, place, situation. Did not assess for date     General Comments  VSS t/o session.    Exercises Other Exercises Other Exercises: Pt educated on role of OT in acute setting, safety, falls prevention strategies, and DC recs.   Shoulder Instructions      Home Living Family/patient expects to be discharged to:: Private residence Living Arrangements: Alone Available Help at Discharge: Friend(s);Available PRN/intermittently;Family (Niece coming up for Christmas) Type of Home: Mobile home Home Access: Stairs to enter (3 at back) Secretary/administrator of Steps: 5 Entrance Stairs-Rails: Can reach both;Left;Right Home Layout: One level     Bathroom Shower/Tub: Chief Strategy Officer: Standard     Home Equipment: Rollator (4 wheels)          Prior Functioning/Environment Prior Level of Function : Independent/Modified  Independent;Driving             Mobility Comments: Independent, 1 fall 2 weeks PTA when feeling very weak from this illness. ADLs Comments: Independent. likes to go shopping and " do whatever I feel like doing".        OT Problem List: Decreased strength;Decreased activity tolerance;Decreased safety awareness;Impaired balance (sitting and/or standing);Decreased knowledge of use of DME or AE;Decreased coordination      OT Treatment/Interventions: Self-care/ADL training;Therapeutic exercise;Therapeutic activities;DME and/or AE instruction;Patient/family education;Balance training;Energy conservation    OT Goals(Current goals can be found in the care plan section) Acute Rehab OT Goals Patient Stated Goal: To go home OT Goal Formulation: With patient Time For Goal Achievement: 05/17/23 Potential to Achieve Goals: Good ADL Goals Pt Will Perform Grooming: sitting;with modified independence;standing Pt Will Perform Lower Body Dressing: sit to/from stand;with set-up;with supervision Pt Will Transfer to Toilet: bedside commode;with set-up;with supervision;regular height toilet;ambulating Pt Will Perform Toileting - Clothing Manipulation and hygiene: sit to/from stand;with supervision;with set-up  OT Frequency: Min 1X/week    Co-evaluation              AM-PAC OT "6 Clicks" Daily  Activity     Outcome Measure Help from another person eating meals?: A Little Help from another person taking care of personal grooming?: A Little Help from another person toileting, which includes using toliet, bedpan, or urinal?: A Little Help from another person bathing (including washing, rinsing, drying)?: A Little Help from another person to put on and taking off regular upper body clothing?: A Little Help from another person to put on and taking off regular lower body clothing?: A Little 6 Click Score: 18   End of Session Equipment Utilized During Treatment: Gait belt Nurse Communication: Mobility  status  Activity Tolerance: Patient tolerated treatment well Patient left: in bed;with call bell/phone within reach;with bed alarm set  OT Visit Diagnosis: Other abnormalities of gait and mobility (R26.89);Muscle weakness (generalized) (M62.81)                Time: 8295-6213 OT Time Calculation (min): 26 min Charges:  OT General Charges $OT Visit: 1 Visit OT Evaluation $OT Eval Moderate Complexity: 1 Mod OT Treatments $Self Care/Home Management : 8-22 mins  Rockney Ghee, M.S., OTR/L 05/03/23, 10:13 AM

## 2023-05-03 NOTE — ED Notes (Signed)
MD at bedside. 

## 2023-05-03 NOTE — Progress Notes (Signed)
PHARMACY - PHYSICIAN COMMUNICATION CRITICAL VALUE ALERT - BLOOD CULTURE IDENTIFICATION (BCID)  Holly Bowen is an 67 y.o. female who presented to [facility] on 05/02/2023 with a chief complaint of not eaten or drank for 2 week and diarrhea.   Assessment:  blood culture from 12/2 with GPC in 1 of 4 bottles, BCID detected Staphylococcus species, NOT S. Aureus.    Name of physician (or Provider) Contacted: Dr Denton Lank  Current antibiotics: TMP/SMZ  Changes to prescribed antibiotics recommended:  Patient is on recommended antibiotics - No changes needed - suspect blood culture represents contaminant  Results for orders placed or performed during the hospital encounter of 05/02/23  Blood Culture ID Panel (Reflexed) (Collected: 05/02/2023  1:14 PM)  Result Value Ref Range   Enterococcus faecalis NOT DETECTED NOT DETECTED   Enterococcus Faecium NOT DETECTED NOT DETECTED   Listeria monocytogenes NOT DETECTED NOT DETECTED   Staphylococcus species DETECTED (A) NOT DETECTED   Staphylococcus aureus (BCID) NOT DETECTED NOT DETECTED   Staphylococcus epidermidis NOT DETECTED NOT DETECTED   Staphylococcus lugdunensis NOT DETECTED NOT DETECTED   Streptococcus species NOT DETECTED NOT DETECTED   Streptococcus agalactiae NOT DETECTED NOT DETECTED   Streptococcus pneumoniae NOT DETECTED NOT DETECTED   Streptococcus pyogenes NOT DETECTED NOT DETECTED   A.calcoaceticus-baumannii NOT DETECTED NOT DETECTED   Bacteroides fragilis NOT DETECTED NOT DETECTED   Enterobacterales NOT DETECTED NOT DETECTED   Enterobacter cloacae complex NOT DETECTED NOT DETECTED   Escherichia coli NOT DETECTED NOT DETECTED   Klebsiella aerogenes NOT DETECTED NOT DETECTED   Klebsiella oxytoca NOT DETECTED NOT DETECTED   Klebsiella pneumoniae NOT DETECTED NOT DETECTED   Proteus species NOT DETECTED NOT DETECTED   Salmonella species NOT DETECTED NOT DETECTED   Serratia marcescens NOT DETECTED NOT DETECTED   Haemophilus  influenzae NOT DETECTED NOT DETECTED   Neisseria meningitidis NOT DETECTED NOT DETECTED   Pseudomonas aeruginosa NOT DETECTED NOT DETECTED   Stenotrophomonas maltophilia NOT DETECTED NOT DETECTED   Candida albicans NOT DETECTED NOT DETECTED   Candida auris NOT DETECTED NOT DETECTED   Candida glabrata NOT DETECTED NOT DETECTED   Candida krusei NOT DETECTED NOT DETECTED   Candida parapsilosis NOT DETECTED NOT DETECTED   Candida tropicalis NOT DETECTED NOT DETECTED   Cryptococcus neoformans/gattii NOT DETECTED NOT DETECTED    Juliette Alcide, PharmD, BCPS, BCIDP Work Cell: [phone] 05/03/2023 10:41 AM

## 2023-05-03 NOTE — TOC CM/SW Note (Signed)
Patient moved to 2A. SNF rec. Attempted to speak to patient about SNF rec. Patient just getting to her room. TOC will follow up later.  Alfonso Ramus, LCSW Transitions of Care Department [phone]

## 2023-05-03 NOTE — ED Notes (Signed)
Pt ambulatory with rolling walker and stand by assist. Pt has steady gait

## 2023-05-03 NOTE — ED Notes (Signed)
Patient complaining for chest pressure and concern that "IV has caused it."  BP noted to be decreasing.

## 2023-05-03 NOTE — TOC PASRR Note (Signed)
To Whom It May Concern:  Please be advised that the above-named patient will require a short-term nursing home stay - anticipated 30 days or less for rehabilitation and strengthening.  The plan is for return home.  

## 2023-05-04 DIAGNOSIS — I4891 Unspecified atrial fibrillation: Secondary | ICD-10-CM | POA: Diagnosis not present

## 2023-05-04 LAB — BASIC METABOLIC PANEL
Anion gap: 8 (ref 5–15)
BUN: 16 mg/dL (ref 8–23)
CO2: 23 mmol/L (ref 22–32)
Calcium: 8.7 mg/dL — ABNORMAL LOW (ref 8.9–10.3)
Chloride: 106 mmol/L (ref 98–111)
Creatinine, Ser: 0.74 mg/dL (ref 0.44–1.00)
GFR, Estimated: >60 mL/min (ref >60)
Glucose, Bld: 87 mg/dL (ref 70–99)
Potassium: 3.7 mmol/L (ref 3.5–5.1)
Sodium: 137 mmol/L (ref 135–145)

## 2023-05-04 LAB — CBC
HCT: 32.1 % — ABNORMAL LOW (ref 36.0–46.0)
Hemoglobin: 10.7 g/dL — ABNORMAL LOW (ref 12.0–15.0)
MCH: 32 pg (ref 26.0–34.0)
MCHC: 33.3 g/dL (ref 30.0–36.0)
MCV: 96.1 fL (ref 80.0–100.0)
Platelets: 469 10*3/uL — ABNORMAL HIGH (ref 150–400)
RBC: 3.34 MIL/uL — ABNORMAL LOW (ref 3.87–5.11)
RDW: 14.5 % (ref 11.5–15.5)
WBC: 17.1 10*3/uL — ABNORMAL HIGH (ref 4.0–10.5)
nRBC: 0 % (ref 0.0–0.2)

## 2023-05-04 LAB — URINE CULTURE: Culture: >=100000 — AB

## 2023-05-04 LAB — CULTURE, BLOOD (ROUTINE X 2)

## 2023-05-04 LAB — PHOSPHORUS: Phosphorus: 2.6 mg/dL (ref 2.5–4.6)

## 2023-05-04 LAB — MAGNESIUM: Magnesium: 2.1 mg/dL (ref 1.7–2.4)

## 2023-05-04 LAB — HIV ANTIBODY (ROUTINE TESTING W REFLEX): HIV Screen 4th Generation wRfx: NONREACTIVE

## 2023-05-04 MED ORDER — DILTIAZEM HCL ER COATED BEADS 180 MG PO CP24
180.0000 mg | ORAL_CAPSULE | Freq: Every day | ORAL | Status: DC
  Administered 2023-05-04 – 2023-05-06 (×3): 180 mg via ORAL
  Filled 2023-05-04 (×3): qty 1

## 2023-05-04 MED ORDER — POTASSIUM CHLORIDE CRYS ER 20 MEQ PO TBCR
20.0000 meq | EXTENDED_RELEASE_TABLET | Freq: Once | ORAL | Status: AC
  Administered 2023-05-04: 20 meq via ORAL
  Filled 2023-05-04: qty 1

## 2023-05-04 NOTE — TOC Progression Note (Signed)
Transition of Care [facility]) - Progression Note    Patient Details  Name: Holly Bowen MRN: [id number] Date of Birth: September 03, 1955  Transition of Care [facility]) CM/SW Contact  Truddie Hidden, RN Phone Number: 05/04/2023, 11:29 AM  Clinical Narrative:    Spoke with patient to give bed offer for [facility] and [facility]. Patient has chosen [facility] She was advised of next steps for insurance approval.  HTA contacted for insurance approval.   Expected Discharge Plan: Skilled Nursing Facility Barriers to Discharge: Continued Medical Work up  Expected Discharge Plan and Services       Living arrangements for the past 2 months: Single Family Home                                       Social Determinants of Health (SDOH) Interventions SDOH Screenings   Food Insecurity: No Food Insecurity (05/03/2023)  Housing: Low Risk  (05/03/2023)  Transportation Needs: No Transportation Needs (05/03/2023)  Utilities: Not At Risk (05/03/2023)  Financial Resource Strain: Patient Declined (09/24/2022)   Received from [facility], [facility]  Physical Activity: Insufficiently Active (01/25/2022)   Received from [facility], [facility]  Social Connections: Unknown (10/01/2021)   Received from [facility], [facility]  Stress: Patient Declined (01/25/2022)   Received from [facility], [facility]  Tobacco Use: Low Risk  (05/02/2023)  Recent Concern: Tobacco Use - High Risk (03/18/2023)   Received from [facility]    Readmission Risk Interventions     No data to display

## 2023-05-04 NOTE — Progress Notes (Signed)
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  She was started on Cardizem infusion for rate control.  She was also found to have a UTI.  Assessment and Plan:  A-fib with RVR Suspect from dehydration and electrolyte derangements Given IV fluids for volume resuscitation on admission [facility] Cardiology consulted  Weaned off Cardizem drip, titrating PO Diltiazem Continue home PO metoprolol 50 mg BID Continue home flecainide Continue Xarelto (pt on for Hx of PE)  UTI Urine culture with E coli, resistant to Ampicillin Will treat with Ceftriaxone x 3 doses Denies urinary symptoms but she was altered with n/v/d so perhaps this was a true infection   Intractable nauseous vomiting diarrhea  Improved Had a normal BM last night Cancel C diff and GI panel testing She continues to report anorexia and early satiety; if not improving consider imaging and/or GI consult   Hyponatremia  Na 126 on admission  Resolved with IVF   Hyperglycemia  Non-fasting glucose 215 on admission No known history of diabetes Suspect this was an acute phase reaction, as glucose has been well controlled since admission    Hx of PE Continue Xarelto   Generalized weakness Due to profound electrolyte abnormalities and severe dehydration and AKI PT OT evaluations, recommending SNF rehab which patient agrees with Fall precautions   Restrictive lung disease  Chronic, compensated  Pressure ulcer Pressure Injury 05/02/23 Coccyx Stage 2 -  Partial thickness loss of dermis presenting as a shallow open injury with a red, pink wound bed without slough. (Active)  05/02/23 2244  Location: Coccyx  Location Orientation:   Staging: Stage 2  -  Partial thickness loss of dermis presenting as a shallow open injury with a red, pink wound bed without slough.  Wound Description (Comments):   Present on Admission:           Consultants: Cardiology PT OT [facility] team  Procedures: None  Antibiotics: Ceftriaxone 12/3-5  30 Day Unplanned Readmission Risk Score    Flowsheet Row ED to Hosp-Admission (Current) from 05/02/2023 in [facility] PCU  30 Day Unplanned Readmission Risk Score (%) 18.77 Filed at 05/04/2023 0801       This score is the patient's risk of an unplanned readmission within 30 days of being discharged (0 -100%). The score is based on dignosis, age, lab data, medications, orders, and past utilization.   Low:  0-14.9   Medium: 15-21.9   High: 22-29.9   Extreme: 30 and above           Subjective: No complaints.  Reports early satiety and disinterest in eating but other symptoms have resolved.  Nursing reports lethargy, no apparent interest in getting up.   Objective: Vitals:   05/04/23 0915 05/04/23 1339  BP: 97/69 99/68  Pulse: 79 90  Resp: 20 20  Temp: 98.2 F (36.8 C) 98.4 F (36.9 C)  SpO2: 98% 96%    Intake/Output Summary (Last 24 hours) at 05/04/2023 1718 Last data filed at 05/03/2023 1900 Gross per 24 hour  Intake 0 ml  Output --  Net 0 ml   Filed Weights   05/02/23 1235  Weight: 68 kg    Exam:  General:  Appears calm and  comfortable and is in NAD Eyes:   EOMI, normal lids, iris ENT:  grossly normal hearing, lips & tongue, mmm Neck:  no LAD, masses or thyromegaly Cardiovascular:  Irregularly irregular, rate controlled. No LE edema.  Respiratory:   CTA bilaterally with no wheezes/rales/rhonchi.  Normal respiratory effort. Abdomen:  soft, NT, ND Skin:  no rash or induration seen on limited exam Musculoskeletal:  grossly normal tone BUE/BLE, good ROM, no bony abnormality Psychiatric:  blunted mood and affect, speech fluent and appropriate, AOx3, denies  depression Neurologic:  CN 2-12 grossly intact, moves all extremities in coordinated fashion  Data Reviewed: I have reviewed the patient's lab results since admission.  Pertinent labs for today include:   Unremarkable BMP WBC 17.1, improved Hgb 10.7, stable Urine culture >100k colonies GNR    Family Communication: None present; she asked me not to contact family today  Disposition: Status is: Inpatient Remains inpatient appropriate because: ongoing evaluation     Time spent: 50 minutes  Unresulted Labs (From admission, onward)     Start     Ordered   05/04/23 0500  Basic metabolic panel  Daily,   R      05/03/23 1128   05/04/23 0500  CBC  Daily,   R      05/03/23 1128             Author: Jonah Blue, MD 05/04/2023 5:18 PM  For on call review [email or web address].

## 2023-05-04 NOTE — Progress Notes (Signed)
[facility] CLINIC CARDIOLOGY PROGRESS NOTE       Patient ID: Holly Bowen MRN: [id number] DOB/AGE: 06/11/1955 67 y.o.  Admit date: 05/02/2023 Referring Physician Dr. Esaw Grandchild Primary Physician Dorothey Baseman, MD  Primary Cardiologist Dr. Dorothyann Peng Reason for Consultation AF RVR  HPI: Holly Bowen is a 67 y.o. 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 Found to be in AF RVR. Cardiology was consulted for further evaluation.   Interval history: -Patient resting comfortably this AM, states she feels about the same as yesterday.  -Palpitation symptoms overall improved, HR better controlled but remains in atrial fibrillation.  -BP borderline but patient without dizziness/lightheadedness.  -Cr and electrolytes improved on AM labs.  Review of systems complete and found to be negative unless listed above    Past Medical History:  Diagnosis Date   Anxiety    Depression    Dyspnea    hx clot in lung, SOB with exertion   Hypertension    IBS (irritable bowel syndrome)    PONV (postoperative nausea and vomiting)    Postsplenectomy thrombocytosis    Restrictive lung disease     Past Surgical History:  Procedure Laterality Date   ABDOMINAL HYSTERECTOMY     BREAST BIOPSY Right    neg   CERVICAL FUSION  2017   COLONOSCOPY WITH PROPOFOL N/A 02/22/2018   Procedure: COLONOSCOPY WITH PROPOFOL;  Surgeon: Toledo, Boykin Nearing, MD;  Location: [facility] ENDOSCOPY;  Service: Gastroenterology;  Laterality: N/A;   ESOPHAGOGASTRODUODENOSCOPY     ESOPHAGOGASTRODUODENOSCOPY N/A 02/22/2018   Procedure: ESOPHAGOGASTRODUODENOSCOPY (EGD);  Surgeon: Toledo, Boykin Nearing, MD;  Location: [facility] ENDOSCOPY;  Service: Gastroenterology;  Laterality: N/A;   POSTERIOR CERVICAL FUSION/FORAMINOTOMY N/A 11/14/2019   Procedure: CERVICAL 4 - CERVICAL 7 POSTERIOR SPINAL  FUSION WITH INSTRUMENTATION AND ALLOGRAFT;  Surgeon: Estill Bamberg, MD;  Location: [facility] OR;  Service: Orthopedics;  Laterality: N/A;   spleen removed  2015    Medications Prior to Admission  Medication Sig Dispense Refill Last Dose   acetaminophen (TYLENOL) 325 MG tablet Take 650 mg by mouth every 6 (six) hours as needed for moderate pain.    prn at unknown   amLODipine (NORVASC) 10 MG tablet Take 1 tablet by mouth daily.   Past Week   atorvastatin (LIPITOR) 20 MG tablet Take 1 tablet by mouth daily.   Past Week   cloNIDine (CATAPRES) 0.1 MG tablet Take 0.1 mg by mouth 2 (two) times daily as needed.   prn at unknown   flecainide (TAMBOCOR) 50 MG tablet Take 1 tablet by mouth 2 (two) times daily.   Past Week   hydrALAZINE (APRESOLINE) 25 MG tablet Take 1 tablet by mouth 2 (two) times daily.   Past Week   metoprolol tartrate (LOPRESSOR) 50 MG tablet Take 50 mg by mouth 2 (two) times daily.   Past Week   risperiDONE (RISPERDAL) 0.25 MG tablet Take 0.5 mg by mouth at bedtime.    Past Week   rivaroxaban (XARELTO) 20 MG TABS tablet Take 20 mg by mouth daily with supper.   Past Week   busPIRone (BUSPAR) 30 MG tablet Take 30 mg by mouth daily.  (Patient not taking: Reported on 05/02/2023)   Not Taking   methylPREDNISolone (MEDROL DOSEPAK) 4 MG TBPK tablet Take 6 pills on day one then decrease by 1 pill each day (Patient not taking: Reported  on 05/02/2023) 21 tablet 0 Not Taking   Social History   Socioeconomic History   Marital status: Single    Spouse name: Not on file   Number of children: Not on file   Years of education: Not on file   Highest education level: Not on file  Occupational History   Not on file  Tobacco Use   Smoking status: Never   Smokeless tobacco: Never  Vaping Use   Vaping status: Never Used  Substance and Sexual Activity   Alcohol use: No   Drug use: No   Sexual activity: Yes    Birth control/protection: Surgical    Comment: Hysterectomy  Other Topics Concern   Not  on file  Social History Narrative   ** Merged History Encounter **       Social Determinants of Health   Financial Resource Strain: Patient Declined (09/24/2022)   Received from [facility], [facility]   Overall Financial Resource Strain (CARDIA)    Difficulty of Paying Living Expenses: Patient declined  Food Insecurity: No Food Insecurity (05/03/2023)   Hunger Vital Sign    Worried About Running Out of Food in the Last Year: Never true    Ran Out of Food in the Last Year: Never true  Transportation Needs: No Transportation Needs (05/03/2023)   PRAPARE - Administrator, Civil Service (Medical): No    Lack of Transportation (Non-Medical): No  Physical Activity: Insufficiently Active (01/25/2022)   Received from [facility], [facility]   Exercise Vital Sign    Days of Exercise per Week: 4 days    Minutes of Exercise per Session: 30 min  Stress: Patient Declined (01/25/2022)   Received from [facility], [facility] Occupational [facility] - Occupational Stress Questionnaire    Feeling of Stress : Patient declined  Social Connections: Unknown (10/01/2021)   Received from [facility], [facility]   Social Network    Social Network: Not on file  Intimate Partner Violence: Not At Risk (05/03/2023)   Humiliation, Afraid, Rape, and Kick questionnaire    Fear of Current or Ex-Partner: No    Emotionally Abused: No    Physically Abused: No    Sexually Abused: No    Family History  Problem Relation Age of Onset   Breast cancer Neg Hx      Vitals:   05/03/23 2100 05/03/23 2300 05/04/23 0055 05/04/23 0314  BP:   99/60 (!) 104/58  Pulse:   75 85  Resp: 19 18 20 17   Temp: 98 F (36.7 C)  97.6 F (36.4 C) 97.6 F (36.4 C)  TempSrc: Oral  Oral   SpO2:   96% 94%  Weight:      Height:        PHYSICAL EXAM General: Chronically ill appearing female, well nourished, in no acute distress resting comfortably in  hospital bed. HEENT: Normocephalic and atraumatic. Neck: No JVD.  Lungs: Normal respiratory effort on room air. Clear bilaterally to auscultation. No wheezes, crackles, rhonchi.  Heart: Irregularly irregular, controlled rate. Normal S1 and S2 without gallops or murmurs.  Abdomen: Non-distended appearing.  Msk: Normal strength and tone for age. Extremities: Warm and well perfused. No clubbing, cyanosis. No edema.  Neuro: Alert and oriented X 3. Psych: Answers questions appropriately.   Labs: Basic Metabolic Panel: Recent Labs    05/02/23 1236 05/02/23 2203 05/03/23 0633 05/04/23 0438  NA 126*   < > 132* 137  K 2.7*   < > 3.8 3.7  CL 89*   < > 107 106  CO2 21*   < > 19* 23  GLUCOSE 215*   < > 104* 87  BUN 37*   < > 23 16  CREATININE 1.27*   < > 0.78 0.74  CALCIUM 9.1   < > 7.9* 8.7*  MG 1.9  --   --  2.1  PHOS 2.4*  --   --  2.6   < > = values in this interval not displayed.   Liver Function Tests: Recent Labs    05/02/23 1236  AST 46*  ALT 33  ALKPHOS 102  BILITOT 1.1  PROT 6.7  ALBUMIN 3.2*   Recent Labs    05/02/23 1236  LIPASE 59*   CBC: Recent Labs    05/03/23 0633 05/04/23 0438  WBC 21.7* 17.1*  HGB 10.1* 10.7*  HCT 29.4* 32.1*  MCV 93.3 96.1  PLT 422* 469*   Cardiac Enzymes: No results for input(s): "CKTOTAL", "CKMB", "CKMBINDEX", "TROPONINIHS" in the last 72 hours. BNP: No results for input(s): "BNP" in the last 72 hours. D-Dimer: No results for input(s): "DDIMER" in the last 72 hours. Hemoglobin A1C: No results for input(s): "HGBA1C" in the last 72 hours. Fasting Lipid Panel: No results for input(s): "CHOL", "HDL", "LDLCALC", "TRIG", "CHOLHDL", "LDLDIRECT" in the last 72 hours. Thyroid Function Tests: Recent Labs    05/02/23 1236  TSH 2.753   Anemia Panel: No results for input(s): "VITAMINB12", "FOLATE", "FERRITIN", "TIBC", "IRON", "RETICCTPCT" in the last 72 hours.   Radiology: [facility] Chest Port 1 View  Result Date:  05/02/2023 CLINICAL DATA:  Weakness. EXAM: PORTABLE CHEST 1 VIEW COMPARISON:  11/07/2019. FINDINGS: Low lung volume. Redemonstration of elevated right hemidiaphragm. There is atelectasis/scarring at lung bases, similar to the prior study. There is nonspecific heterogeneous opacity in the relatively linear distribution overlying the left upper lung zone, which appears more pronounced than the prior radiograph. These may represent fibrosis/atelectasis however, correlate clinically to determine the need for further characterization with chest CT scan. Bilateral lung fields are otherwise clear. Bilateral costophrenic angles are clear. Stable cardio-mediastinal silhouette. Lower cervical spinal fixation hardware noted. No acute osseous abnormalities. The soft tissues are within normal limits. There are embolization coils overlying the left upper abdomen. IMPRESSION: *Redemonstration of atelectasis/scarring at the lung bases, similar to the prior study. *However, there is heterogeneous opacity overlying the left upper lung zone, which appears more pronounced than the prior radiograph. These may represent fibrosis/atelectasis; however, correlate clinically to determine the need for further characterization with chest CT scan. Electronically Signed   By: Jules Schick M.D.   On: 05/02/2023 15:41     TELEMETRY reviewed by me 05/04/2023: atrial fibrillation rate 80s  EKG reviewed by me: AF RVR rate 154 bpm (11/2), repeat today with AF rate 90  Data reviewed by me 05/04/2023: last 24h vitals tele labs imaging I/O hospitalist progress note  Principal Problem:   Atrial fibrillation with RVR (HCC) Active Problems:   Clinical depression   Benign hypertension   Chronic restrictive lung disease   Hypokalemia   Hyponatremia   Pressure injury of skin   History of pulmonary embolism   Sepsis secondary to UTI (HCC)   Generalized weakness   Dehydration   Nausea vomiting and diarrhea    ASSESSMENT AND PLAN:   Holly Bowen is a 67 y.o. 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 Found to be in AF RVR. Cardiology was consulted for further evaluation.   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 Started on IV diltiazem. Rates improved in the 90s this AM.  -Transition to cardizem CD 180 mg daily today. -Continue metoprolol tartrate 50 mg bid.  -Continue xarelto 20 mg daily for stroke risk reduction.   # AKI # UTI # Hypokalemia Patient presenting with Cr of 1.27 (baseline 0.5-0.6) and K of 2.7, likely 2/2 to hypovolemia. Received electrolyte supplementation and IVF in the ED. Cr improved to 0.78 on AM labs. -Monitor and replenish electrolytes for a goal K >4, Mag >2   -Continue to monitor renal function closely.  -Antibiotic management per primary  # Intractable nausea/vomiting/diarrhea # Generalized weakness Patient with N/V/D over the last 2 weeks with associated weakness.  -Management per primary.  # Hx PE -Continue xarelto as above.  This patient's plan of care was discussed and created with Dr. Melton Alar and she is in agreement.  Signed: Gale Journey, PA-C  05/04/2023, 8:05 AM [facility]

## 2023-05-04 NOTE — Hospital Course (Signed)
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  She was started on Cardizem infusion for rate control.  She was also found to have a UTI.

## 2023-05-05 DIAGNOSIS — I4891 Unspecified atrial fibrillation: Secondary | ICD-10-CM | POA: Diagnosis not present

## 2023-05-05 LAB — BASIC METABOLIC PANEL
Anion gap: 9 (ref 5–15)
BUN: 10 mg/dL (ref 8–23)
CO2: 23 mmol/L (ref 22–32)
Calcium: 8.6 mg/dL — ABNORMAL LOW (ref 8.9–10.3)
Chloride: 105 mmol/L (ref 98–111)
Creatinine, Ser: 0.73 mg/dL (ref 0.44–1.00)
GFR, Estimated: >60 mL/min (ref >60)
Glucose, Bld: 92 mg/dL (ref 70–99)
Potassium: 3.6 mmol/L (ref 3.5–5.1)
Sodium: 137 mmol/L (ref 135–145)

## 2023-05-05 LAB — CBC
HCT: 29.1 % — ABNORMAL LOW (ref 36.0–46.0)
Hemoglobin: 9.8 g/dL — ABNORMAL LOW (ref 12.0–15.0)
MCH: 32 pg (ref 26.0–34.0)
MCHC: 33.7 g/dL (ref 30.0–36.0)
MCV: 95.1 fL (ref 80.0–100.0)
Platelets: 487 10*3/uL — ABNORMAL HIGH (ref 150–400)
RBC: 3.06 MIL/uL — ABNORMAL LOW (ref 3.87–5.11)
RDW: 14.6 % (ref 11.5–15.5)
WBC: 15.9 10*3/uL — ABNORMAL HIGH (ref 4.0–10.5)
nRBC: 0 % (ref 0.0–0.2)

## 2023-05-05 LAB — MAGNESIUM: Magnesium: 1.5 mg/dL — ABNORMAL LOW (ref 1.7–2.4)

## 2023-05-05 MED ORDER — VITAMIN C 500 MG PO TABS
500.0000 mg | ORAL_TABLET | Freq: Two times a day (BID) | ORAL | Status: DC
  Administered 2023-05-05 – 2023-05-06 (×3): 500 mg via ORAL
  Filled 2023-05-05 (×3): qty 1

## 2023-05-05 MED ORDER — ORAL CARE MOUTH RINSE: 15.0000 mL | OROMUCOSAL | Status: DC | PRN

## 2023-05-05 MED ORDER — ZINC SULFATE 220 (50 ZN) MG PO CAPS
220.0000 mg | ORAL_CAPSULE | Freq: Every day | ORAL | Status: DC
  Administered 2023-05-05 – 2023-05-06 (×2): 220 mg via ORAL
  Filled 2023-05-05 (×2): qty 1

## 2023-05-05 MED ORDER — POTASSIUM CHLORIDE CRYS ER 20 MEQ PO TBCR
40.0000 meq | EXTENDED_RELEASE_TABLET | Freq: Two times a day (BID) | ORAL | Status: AC
  Administered 2023-05-05 (×2): 40 meq via ORAL
  Filled 2023-05-05 (×2): qty 2

## 2023-05-05 MED ORDER — BOOST / RESOURCE BREEZE PO LIQD CUSTOM
1.0000 | Freq: Three times a day (TID) | ORAL | Status: DC
  Administered 2023-05-05 (×2): 1 via ORAL

## 2023-05-05 MED ORDER — MAGNESIUM SULFATE 2 GM/50ML IV SOLN
2.0000 g | Freq: Once | INTRAVENOUS | Status: AC
  Administered 2023-05-05: 2 g via INTRAVENOUS
  Filled 2023-05-05: qty 50

## 2023-05-05 MED ORDER — ADULT MULTIVITAMIN W/MINERALS CH
1.0000 | ORAL_TABLET | Freq: Every day | ORAL | Status: DC
  Administered 2023-05-05 – 2023-05-06 (×2): 1 via ORAL
  Filled 2023-05-05 (×2): qty 1

## 2023-05-05 MED ORDER — PROSOURCE PLUS PO LIQD
30.0000 mL | Freq: Three times a day (TID) | ORAL | Status: DC
  Administered 2023-05-05: 30 mL via ORAL
  Filled 2023-05-05: qty 30

## 2023-05-05 NOTE — TOC Progression Note (Addendum)
Transition of Care [facility]) - Progression Note    Patient Details  Name: GENELDA GIAMBRA MRN: [id number] Date of Birth: 1956/04/26  [facility]) CM/SW Contact  Truddie Hidden, RN Phone Number: 05/05/2023, 2:35 PM  Clinical Narrative:    HTA auth started. Left a VM for Tammy,in UR requesting auth be started.    Expected Discharge Plan: Skilled Nursing Facility Barriers to Discharge: Continued Medical Work up  Expected Discharge Plan and Services       Living arrangements for the past 2 months: Single Family Home                                       Social Determinants of Health (SDOH) Interventions SDOH Screenings   Food Insecurity: No Food Insecurity (05/03/2023)  Housing: Low Risk  (05/03/2023)  Transportation Needs: No Transportation Needs (05/03/2023)  Utilities: Not At Risk (05/03/2023)  Financial Resource Strain: Patient Declined (09/24/2022)   Received from [facility], [facility]  Physical Activity: Insufficiently Active (01/25/2022)   Received from [facility], [facility]  Social Connections: Unknown (10/01/2021)   Received from [facility], [facility]  Stress: Patient Declined (01/25/2022)   Received from [facility], [facility]  Tobacco Use: Low Risk  (05/02/2023)  Recent Concern: Tobacco Use - High Risk (03/18/2023)   Received from [facility]    Readmission Risk Interventions     No data to display

## 2023-05-05 NOTE — Progress Notes (Signed)
Physical Therapy Treatment Patient Details Name: Holly Bowen MRN: [id number] DOB: Nov 21, 1955 Today's Date: 05/05/2023   History of Present Illness Holly Bowen is a 67 y.o. 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    PT Comments  Pt is making gradual progress towards goals, primarily self limiting. Reports she has been OOB to [facility]. Offered toileting services while in room, pt declined. Pt only agreeable to ambulate to recliner in room and is looking forward to receiving a bath this date. RN in room at end of session to provide bathing supplies. Will continue to progress towards independence.    If plan is discharge home, recommend the following: A little help with walking and/or transfers;A little help with bathing/dressing/bathroom   Can travel by private vehicle     Yes  Equipment Recommendations  Rolling walker (2 wheels)    Recommendations for Other Services       Precautions / Restrictions Precautions Precautions: Fall Restrictions Weight Bearing Restrictions: No     Mobility  Bed Mobility Overal bed mobility: Needs Assistance Bed Mobility: Supine to Sit, Sit to Supine     Supine to sit: Min assist, Used rails, HOB elevated     General bed mobility comments: needs cues for sequencing and reaches for therapist hands    Transfers Overall transfer level: Needs assistance Equipment used: Rolling walker (2 wheels) Transfers: Sit to/from Stand Sit to Stand: Contact guard assist           General transfer comment: able to stand from low surface. Uses unsafe technique initially pulls up on RW which causes post LOB. Cues given for safe technique and pt able to stand on 2nd attempt with CGA    Ambulation/Gait Ambulation/Gait assistance: Contact guard assist Gait  Distance (Feet): 5 Feet Assistive device: Rolling walker (2 wheels) Gait Pattern/deviations: Step-to pattern       General Gait Details: Pt fatigues rapidly and feels unable to progress ambulation further than recliner at bedside. HR WNL   Stairs             Wheelchair Mobility     Tilt Bed    Modified Rankin (Stroke Patients Only)       Balance Overall balance assessment: Needs assistance Sitting-balance support: No upper extremity supported, Feet supported Sitting balance-Leahy Scale: Fair     Standing balance support: During functional activity, Single extremity supported, Reliant on assistive device for balance Standing balance-Leahy Scale: Fair                              Cognition Arousal: Alert Behavior During Therapy: WFL for tasks assessed/performed Overall Cognitive Status: Within Functional Limits for tasks assessed                                 General Comments: still remains reluctant for participation, eventually agreeable to OOB mobility        Exercises      General Comments        Pertinent Vitals/Pain Pain Assessment Pain Assessment: No/denies pain    Home Living                          Prior Function  PT Goals (current goals can now be found in the care plan section) Acute Rehab PT Goals Patient Stated Goal: to go to rehab PT Goal Formulation: With patient Time For Goal Achievement: 05/17/23 Potential to Achieve Goals: Good Progress towards PT goals: Progressing toward goals    Frequency    Min 1X/week      PT Plan      Co-evaluation              AM-PAC PT "6 Clicks" Mobility   Outcome Measure  Help needed turning from your back to your side while in a flat bed without using bedrails?: A Little Help needed moving from lying on your back to sitting on the side of a flat bed without using bedrails?: A Little Help needed moving to and from a bed to a chair  (including a wheelchair)?: A Little Help needed standing up from a chair using your arms (e.g., wheelchair or bedside chair)?: A Little Help needed to walk in hospital room?: A Little Help needed climbing 3-5 steps with a railing? : A Lot 6 Click Score: 17    End of Session Equipment Utilized During Treatment: Gait belt Activity Tolerance: Patient limited by fatigue Patient left: in chair;with chair alarm set;with nursing/sitter in room Nurse Communication: Mobility status PT Visit Diagnosis: Unsteadiness on feet (R26.81);Muscle weakness (generalized) (M62.81);Difficulty in walking, not elsewhere classified (R26.2)     Time: 1610-9604 PT Time Calculation (min) (ACUTE ONLY): 10 min  Charges:    $Gait Training: 8-22 mins PT General Charges $$ ACUTE PT VISIT: 1 Visit                     Elizabeth Palau, PT, DPT, GCS [phone]    Leeandre Nordling 05/05/2023, 12:49 PM

## 2023-05-05 NOTE — Plan of Care (Signed)

## 2023-05-05 NOTE — Progress Notes (Signed)
[facility] CLINIC CARDIOLOGY PROGRESS NOTE       Patient ID: Holly Bowen MRN: [id number] DOB/AGE: 1956-01-16 67 y.o.  Admit date: 05/02/2023 Referring Physician Dr. Esaw Grandchild Primary Physician Dorothey Baseman, MD  Primary Cardiologist Dr. Dorothyann Peng Reason for Consultation AF RVR  HPI: Holly Bowen is a 67 y.o. 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 Found to be in AF RVR. Cardiology was consulted for further evaluation.   Interval history: -Patient states she feels better overall this AM.  -HR controlled, tolerating rate control regimen. Denies CP, SOB, palpitations.   -BP controlled, she denies any dizziness.  -K and mag below goal this AM.  Review of systems complete and found to be negative unless listed above    Past Medical History:  Diagnosis Date   Anxiety    Depression    Dyspnea    hx clot in lung, SOB with exertion   Hypertension    IBS (irritable bowel syndrome)    PONV (postoperative nausea and vomiting)    Postsplenectomy thrombocytosis    Restrictive lung disease     Past Surgical History:  Procedure Laterality Date   ABDOMINAL HYSTERECTOMY     BREAST BIOPSY Right    neg   CERVICAL FUSION  2017   COLONOSCOPY WITH PROPOFOL N/A 02/22/2018   Procedure: COLONOSCOPY WITH PROPOFOL;  Surgeon: Toledo, Boykin Nearing, MD;  Location: [facility] ENDOSCOPY;  Service: Gastroenterology;  Laterality: N/A;   ESOPHAGOGASTRODUODENOSCOPY     ESOPHAGOGASTRODUODENOSCOPY N/A 02/22/2018   Procedure: ESOPHAGOGASTRODUODENOSCOPY (EGD);  Surgeon: Toledo, Boykin Nearing, MD;  Location: [facility] ENDOSCOPY;  Service: Gastroenterology;  Laterality: N/A;   POSTERIOR CERVICAL FUSION/FORAMINOTOMY N/A 11/14/2019   Procedure: CERVICAL 4 - CERVICAL 7 POSTERIOR SPINAL FUSION WITH INSTRUMENTATION AND ALLOGRAFT;  Surgeon: Estill Bamberg, MD;   Location: [facility] OR;  Service: Orthopedics;  Laterality: N/A;   spleen removed  2015    Medications Prior to Admission  Medication Sig Dispense Refill Last Dose   acetaminophen (TYLENOL) 325 MG tablet Take 650 mg by mouth every 6 (six) hours as needed for moderate pain.    prn at unknown   amLODipine (NORVASC) 10 MG tablet Take 1 tablet by mouth daily.   Past Week   atorvastatin (LIPITOR) 20 MG tablet Take 1 tablet by mouth daily.   Past Week   cloNIDine (CATAPRES) 0.1 MG tablet Take 0.1 mg by mouth 2 (two) times daily as needed.   prn at unknown   flecainide (TAMBOCOR) 50 MG tablet Take 1 tablet by mouth 2 (two) times daily.   Past Week   hydrALAZINE (APRESOLINE) 25 MG tablet Take 1 tablet by mouth 2 (two) times daily.   Past Week   metoprolol tartrate (LOPRESSOR) 50 MG tablet Take 50 mg by mouth 2 (two) times daily.   Past Week   risperiDONE (RISPERDAL) 0.25 MG tablet Take 0.5 mg by mouth at bedtime.    Past Week   rivaroxaban (XARELTO) 20 MG TABS tablet Take 20 mg by mouth daily with supper.   Past Week   busPIRone (BUSPAR) 30 MG tablet Take 30 mg by mouth daily.  (Patient not taking: Reported on 05/02/2023)   Not Taking   methylPREDNISolone (MEDROL DOSEPAK) 4 MG TBPK tablet Take 6 pills on day one then decrease by 1 pill each day (Patient not taking: Reported on 05/02/2023) 21 tablet 0 Not  Taking   Social History   Socioeconomic History   Marital status: Single    Spouse name: Not on file   Number of children: Not on file   Years of education: Not on file   Highest education level: Not on file  Occupational History   Not on file  Tobacco Use   Smoking status: Never   Smokeless tobacco: Never  Vaping Use   Vaping status: Never Used  Substance and Sexual Activity   Alcohol use: No   Drug use: No   Sexual activity: Yes    Birth control/protection: Surgical    Comment: Hysterectomy  Other Topics Concern   Not on file  Social History Narrative   ** Merged History Encounter **        Social Determinants of Health   Financial Resource Strain: Patient Declined (09/24/2022)   Received from [facility], [facility]   Overall Financial Resource Strain (CARDIA)    Difficulty of Paying Living Expenses: Patient declined  Food Insecurity: No Food Insecurity (05/03/2023)   Hunger Vital Sign    Worried About Running Out of Food in the Last Year: Never true    Ran Out of Food in the Last Year: Never true  Transportation Needs: No Transportation Needs (05/03/2023)   PRAPARE - Administrator, Civil Service (Medical): No    Lack of Transportation (Non-Medical): No  Physical Activity: Insufficiently Active (01/25/2022)   Received [facility], [facility]   Exercise Vital Sign    Days of Exercise per Week: 4 days    Minutes of Exercise per Session: 30 min  Stress: Patient Declined (01/25/2022)   Received from [facility], [facility] - Occupational Stress Questionnaire    Feeling of Stress : Patient declined  Social Connections: Unknown (10/01/2021)   Received from [facility], [facility]   Social Network    Social Network: Not on file  Intimate Partner Violence: Not At Risk (05/03/2023)   Humiliation, Afraid, Rape, and Kick questionnaire    Fear of Current or Ex-Partner: No    Emotionally Abused: No    Physically Abused: No    Sexually Abused: No    Family History  Problem Relation Age of Onset   Breast cancer Neg Hx      Vitals:   05/04/23 2103 05/05/23 0030 05/05/23 0326 05/05/23 0758  BP: 125/63 102/65 (!) 103/55 127/75  Pulse: 88 96 82 78  Resp: 20 20 20 16   Temp: 97.8 F (36.6 C) 98.8 F (37.1 C) 98.6 F (37 C) 97.9 F (36.6 C)  TempSrc: Oral Oral Oral Oral  SpO2: 95% 93% 96% 96%  Weight:      Height:        PHYSICAL EXAM General: Chronically ill appearing female, well nourished, in no acute distress sitting upright in hospital bed. HEENT:  Normocephalic and atraumatic. Neck: No JVD.  Lungs: Normal respiratory effort on room air. Clear bilaterally to auscultation. No wheezes, crackles, rhonchi.  Heart: Irregularly irregular, controlled rate. Normal S1 and S2 without gallops or murmurs.  Abdomen: Non-distended appearing.  Msk: Normal strength and tone for age. Extremities: Warm and well perfused. No clubbing, cyanosis. No edema.  Neuro: Alert and oriented X 3. Psych: Answers questions appropriately.   Labs: Basic Metabolic Panel: Recent Labs    05/02/23 1236 05/02/23 2203 05/04/23 0438 05/05/23 0405  NA 126*   < > 137 137  K 2.7*   < >  3.7 3.6  CL 89*   < > 106 105  CO2 21*   < > 23 23  GLUCOSE 215*   < > 87 92  BUN 37*   < > 16 10  CREATININE 1.27*   < > 0.74 0.73  CALCIUM 9.1   < > 8.7* 8.6*  MG 1.9  --  2.1 1.5*  PHOS 2.4*  --  2.6  --    < > = values in this interval not displayed.   Liver Function Tests: Recent Labs    05/02/23 1236  AST 46*  ALT 33  ALKPHOS 102  BILITOT 1.1  PROT 6.7  ALBUMIN 3.2*   Recent Labs    05/02/23 1236  LIPASE 59*   CBC: Recent Labs    05/04/23 0438 05/05/23 0405  WBC 17.1* 15.9*  HGB 10.7* 9.8*  HCT 32.1* 29.1*  MCV 96.1 95.1  PLT 469* 487*   Cardiac Enzymes: No results for input(s): "CKTOTAL", "CKMB", "CKMBINDEX", "TROPONINIHS" in the last 72 hours. BNP: No results for input(s): "BNP" in the last 72 hours. D-Dimer: No results for input(s): "DDIMER" in the last 72 hours. Hemoglobin A1C: No results for input(s): "HGBA1C" in the last 72 hours. Fasting Lipid Panel: No results for input(s): "CHOL", "HDL", "LDLCALC", "TRIG", "CHOLHDL", "LDLDIRECT" in the last 72 hours. Thyroid Function Tests: Recent Labs    05/02/23 1236  TSH 2.753   Anemia Panel: No results for input(s): "VITAMINB12", "FOLATE", "FERRITIN", "TIBC", "IRON", "RETICCTPCT" in the last 72 hours.   Radiology: [facility] Chest Port 1 View  Result Date: 05/02/2023 CLINICAL DATA:  Weakness. EXAM:  PORTABLE CHEST 1 VIEW COMPARISON:  11/07/2019. FINDINGS: Low lung volume. Redemonstration of elevated right hemidiaphragm. There is atelectasis/scarring at lung bases, similar to the prior study. There is nonspecific heterogeneous opacity in the relatively linear distribution overlying the left upper lung zone, which appears more pronounced than the prior radiograph. These may represent fibrosis/atelectasis however, correlate clinically to determine the need for further characterization with chest CT scan. Bilateral lung fields are otherwise clear. Bilateral costophrenic angles are clear. Stable cardio-mediastinal silhouette. Lower cervical spinal fixation hardware noted. No acute osseous abnormalities. The soft tissues are within normal limits. There are embolization coils overlying the left upper abdomen. IMPRESSION: *Redemonstration of atelectasis/scarring at the lung bases, similar to the prior study. *However, there is heterogeneous opacity overlying the left upper lung zone, which appears more pronounced than the prior radiograph. These may represent fibrosis/atelectasis; however, correlate clinically to determine the need for further characterization with chest CT scan. Electronically Signed   By: Jules Schick M.D.   On: 05/02/2023 15:41     TELEMETRY reviewed by me 05/05/2023: not on tele  EKG reviewed by me: AF RVR rate 154 bpm (11/2), repeat today with AF rate 90  Data reviewed by me 05/05/2023: last 24h vitals tele labs imaging I/O hospitalist progress note  Principal Problem:   Atrial fibrillation with RVR (HCC) Active Problems:   Clinical depression   Benign hypertension   Chronic restrictive lung disease   Hypokalemia   Hyponatremia   Pressure injury of skin   History of pulmonary embolism   Sepsis secondary to UTI (HCC)   Generalized weakness   Dehydration   Nausea vomiting and diarrhea    ASSESSMENT AND PLAN:  Holly Bowen is a 67 y.o. 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 Found to be in AF RVR. Cardiology was consulted for further evaluation.   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 Started on IV diltiazem. Rates improved in the 90s this AM.  -Transition to cardizem CD 180 mg daily today. -Continue metoprolol tartrate 50 mg bid.  -Continue xarelto 20 mg daily for stroke risk reduction.   # AKI # UTI # Hypokalemia Patient presenting with Cr of 1.27 (baseline 0.5-0.6) and K of 2.7, likely 2/2 to hypovolemia. Received electrolyte supplementation and IVF in the ED. Cr improved to 0.73 on AM labs. -Monitor and replenish electrolytes for a goal K >4, Mag >2   -Continue to monitor renal function closely.  -Antibiotic management per primary  # Intractable nausea/vomiting/diarrhea # Generalized weakness Patient with N/V/D over the last 2 weeks with associated weakness.  -Management per primary.  # Hx PE -Continue xarelto as above.  Cardiology will sign off. Please haiku with questions or re-engage if needed.  Plan for follow up in clinic in 1-2 weeks with Dr. Juliann Pares  This patient's plan of care was discussed and created with Dr. Melton Alar and she is in agreement.  Signed: Gale Journey, PA-C  05/05/2023, 9:03 AM [facility] Cardiology

## 2023-05-05 NOTE — Consult Note (Addendum)
PHARMACY CONSULT NOTE - FOLLOW UP  Pharmacy Consult for Electrolyte Monitoring and Replacement   Recent Labs: Potassium (mmol/L)  Date Value  05/05/2023 3.6   Magnesium (mg/dL)  Date Value  21/30/8657 1.5 (L)   Calcium (mg/dL)  Date Value  84/69/6295 8.6 (L)   Albumin (g/dL)  Date Value  28/41/3244 3.2 (L)   Phosphorus (mg/dL)  Date Value  06/02/7251 2.6   Sodium (mmol/L)  Date Value  05/05/2023 137    Assessment: Holly Bowen is a 67 year old f who presented with persistent nauseous vomiting, diarrhea and generalized weakness. They have a past medical history of PVCs on flecainide, HTN, PE on Xarelto, and restrictive lung disease. Diagnosed with new onset afib RVR possibly secondary to dehydration and electrolyte imbalances from nausea, vomiting and diarrhea. They had severe hypokalemia on presentation with decreased Mg. This has since resolved and now have a goal of K > 4 and Mg >2 due to new onset afib. Pharmacy was consulted to manage this patient's electrolytes. CHA?DS?-VASc 4 (age, gender, HTN, CAD)  Goal of Therapy: Electrolytes WNL  K > 4 Mg > 2  Plan:  K = 3.6; provider replaced with 2 x 40 mEq Kcl PO  Mg = 1.5; provider replaced with 1 x 2g Mag sulfate IV Recheck BMP, Mg with AM labs  Effie Shy, PharmD Pharmacy Resident  05/05/2023 5:56 PM

## 2023-05-05 NOTE — Progress Notes (Signed)
Initial Nutrition Assessment  DOCUMENTATION CODES:   Not applicable  INTERVENTION:   -Liberalize diet to regular for widest variety of meal selections -MVI with minerals daily -Boost Breeze po TID, each supplement provides 250 kcal and 9 grams of protein  -30 ml Prosource Plus TID, each supplement provides 100 kcals and 15 grams protein -500 mg vitamin C BID -220 mg zinc sulfate daily x 14 days -Feeding assistance with meals  NUTRITION DIAGNOSIS:   Inadequate oral intake related to poor appetite as evidenced by meal completion < 25%, per patient/family report.  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  Pt admitted with a-fib with RVR.   Reviewed I/O's: +1.2 L x 24 hours   Per H&P, pt's symptoms started 2 weeks ago (nausea, vomiting, and diarrhea). Pt was also found to have a UTI and was started on antibiotics; cultures pending.   Spoke with pt at bedside, who was pleasant and in good spirits today. Pt reports that she has been feeling poorly over the past 3 weeks since her symptoms started. Per pt, she no longer has nausea, vomiting, and diarrhea, however, has no appetite or desire to eat. Per her report "everything tastes like cardboard". Pt shares that the only thing that appeals to her is grape juice; RD provided grape juice per her request. Noted pt also with hand weakness and needed assistance putting straws into her cup. Will offer feeding assistance with meals for tray set up and opening containers. Noted meal completions 0-10%.   Prior to acute illness, pt was  in her usual state of health, consuming 3 meals per day. She reports she was maintaining her weight and "all of this happened when I started getting sick". Reviewed wt hx; pt has experienced a 16.6% wt loss over the past 10 months, which is significant for time frame.   Discussed importance of good meal and supplement intake to promote healing. Pt amenable to supplements, however, does not like the taste of Ensure or any milky supplement. Pt amenable to try Boost Breeze.   Medications reviewed and include cardizem and rocephin.   Per TOC notes, plan for SNF placement once medically stable for discharge.   Labs reviewed.    NUTRITION - FOCUSED PHYSICAL EXAM:  Flowsheet Row Most Recent Value  Orbital Region No depletion  Upper Arm Region No depletion  Thoracic and Lumbar Region No depletion  Buccal Region No depletion  Temple Region No depletion  Clavicle Bone Region No depletion  Clavicle and Acromion Bone Region No depletion  Scapular Bone Region No depletion  Dorsal Hand No depletion  Patellar Region No depletion  Anterior Thigh Region No depletion  Posterior Calf Region No depletion  Edema (RD Assessment) Mild  Hair Reviewed  Eyes Reviewed  Mouth Reviewed  Skin Reviewed  Nails Reviewed       Diet Order:   Diet Order             Diet regular Room service appropriate? Yes; Fluid consistency: Thin  Diet effective now                   EDUCATION  NEEDS:   Education needs have been addressed  Skin:  Skin Assessment: Skin Integrity Issues: Skin Integrity Issues:: Stage II Stage II: coccyx  Last BM:  05/03/23  Height:   Ht Readings from Last 1 Encounters:  05/03/23 5\' 5"  (1.651 m)    Weight:   Wt Readings from Last 1 Encounters:  05/02/23 68 kg    Ideal Body Weight:  56.8 kg  BMI:  Body mass index is 24.96 kg/m.  Estimated Nutritional Needs:   Kcal:  1700-1900  Protein:  85-100 grams  Fluid:  > 1.7 L    Levada Schilling, RD, LDN,  CDCES Registered Dietitian III Certified Diabetes Care and Education Specialist Please refer to [facility] for RD and/or RD on-call/weekend/after hours pager

## 2023-05-05 NOTE — Progress Notes (Addendum)
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  She was started on Cardizem infusion for rate control.  She was also found to have a UTI.  Assessment and Plan:  A-fib with RVR Suspect from dehydration and electrolyte derangements Given IV fluids for volume resuscitation on admission [facility] Cardiology consulted, has signed off  Weaned off Cardizem drip, titrating PO Diltiazem Continue home PO metoprolol 50 mg BID Continue home flecainide Continue Xarelto (pt on for Hx of PE)   UTI Urine culture with E coli, resistant to Ampicillin Will treat with Ceftriaxone x 3 doses Denies urinary symptoms but she was altered with n/v/d so perhaps this was a true infection   Intractable nauseous vomiting diarrhea  Improved Had a normal BM last night Cancel C diff and GI panel testing She continues to report anorexia and early satiety; if not improving consider imaging and/or GI consult   Hyperglycemia  Non-fasting glucose 215 on admission No known history of diabetes Suspect this was an acute phase reaction, as glucose has been well controlled since admission    Hx of PE Continue Xarelto   Generalized weakness Due to profound electrolyte abnormalities and severe dehydration and AKI PT OT evaluations, recommending SNF rehab which patient agrees with Fall precautions   Restrictive lung disease  Chronic, compensated   Pressure ulcer Pressure Injury 05/02/23 Coccyx Stage 2 -  Partial thickness loss of dermis presenting as a shallow open injury with a red, pink wound bed without slough. (Active)  05/02/23 2244  Location: Coccyx  Location Orientation:   Staging: Stage 2 -  Partial thickness loss of dermis  presenting as a shallow open injury with a red, pink wound bed without slough.  Wound Description (Comments):   Present on Admission:  Yes    Malnutrition  Nutrition Problem: Inadequate oral intake Etiology: poor appetite Signs/Symptoms: meal completion < 25%, per patient/family report Interventions: Boost Breeze, MVI, Prostat, Liberalize Diet          Consultants: Cardiology PT OT TOC team   Procedures: None   Antibiotics: Ceftriaxone 12/3-5   30 Day Unplanned Readmission Risk Score    Flowsheet Row ED to Hosp-Admission (Current) from 05/02/2023 in [facility]  30 Day Unplanned Readmission Risk Score (%) 20.32 Filed at 05/05/2023 1600       This score is the patient's risk of an unplanned readmission within 30 days of being discharged (0 -100%). The score is based on dignosis, age, lab data, medications, orders, and past utilization.   Low:  0-14.9   Medium: 15-21.9   High: 22-29.9   Extreme: 30 and above           Subjective: She reports ongoing anorexia, otherwise doing ok.  Agrees to go to rehab to work on strength and conditioning.   Objective: Vitals:   05/05/23 0758 05/05/23 1206  BP: 127/75 105/71  Pulse: 78 99  Resp: 16 16  Temp: 97.9 F (36.6 C) 98 F (36.7 C)  SpO2: 96% 98%    Intake/Output Summary (Last 24 hours) at 05/05/2023 1627 Last data filed at 05/05/2023 1543 Gross per 24 hour  Intake 26.41 ml  Output 300 ml  Net -273.59 ml  Filed Weights   05/02/23 1235  Weight: 68 kg    Exam:  General:  Appears calm and comfortable and is in NAD Eyes:   EOMI, normal lids, iris ENT:  grossly normal hearing, lips & tongue, mmm Neck:  no LAD, masses or thyromegaly Cardiovascular:  Irregularly irregular, rate controlled. No LE edema.  Respiratory:   CTA bilaterally with no wheezes/rales/rhonchi.  Normal respiratory effort. Abdomen:  soft, NT, ND Skin:  no rash or induration seen on limited exam Musculoskeletal:   grossly normal tone BUE/BLE, good ROM, no bony abnormality Psychiatric:  blunted mood and affect, speech fluent and appropriate, AOx3, denies depression Neurologic:  CN 2-12 grossly intact, moves all extremities in coordinated fashion  Data Reviewed: I have reviewed the patient's lab results since admission.  Pertinent labs for today include:  Mag++ 1.5 WBC 15.9 Hgb 9.8 Platelets 487      Family Communication: None present; she declined to have me call family  Disposition: Status is: Inpatient Remains inpatient appropriate because: unsafe disposition     Time spent: 35 minutes  Unresulted Labs (From admission, onward)     Start     Ordered   05/06/23 0500  Magnesium  Tomorrow morning,   R        05/05/23 1222   05/04/23 0500  Basic metabolic panel  Daily,   R      05/03/23 1128   05/04/23 0500  CBC  Daily,   R      05/03/23 1128             Author: Jonah Blue, MD 05/05/2023 4:27 PM  For on call review [email or web address].

## 2023-05-05 NOTE — Care Management Important Message (Signed)
Important Message  Patient Details  Name: Holly Bowen MRN: [id number] Date of Birth: 1955/08/18   Important Message Given:  Yes - Medicare IM     Verita Schneiders Adalid Beckmann 05/05/2023, 3:25 PM

## 2023-05-06 DIAGNOSIS — R41841 Cognitive communication deficit: Secondary | ICD-10-CM | POA: Diagnosis not present

## 2023-05-06 DIAGNOSIS — G8929 Other chronic pain: Secondary | ICD-10-CM | POA: Diagnosis not present

## 2023-05-06 DIAGNOSIS — I4891 Unspecified atrial fibrillation: Secondary | ICD-10-CM | POA: Diagnosis not present

## 2023-05-06 DIAGNOSIS — R2681 Unsteadiness on feet: Secondary | ICD-10-CM | POA: Diagnosis not present

## 2023-05-06 DIAGNOSIS — R269 Unspecified abnormalities of gait and mobility: Secondary | ICD-10-CM | POA: Diagnosis not present

## 2023-05-06 DIAGNOSIS — F324 Major depressive disorder, single episode, in partial remission: Secondary | ICD-10-CM | POA: Diagnosis not present

## 2023-05-06 DIAGNOSIS — J984 Other disorders of lung: Secondary | ICD-10-CM | POA: Diagnosis not present

## 2023-05-06 DIAGNOSIS — Z9181 History of falling: Secondary | ICD-10-CM | POA: Diagnosis not present

## 2023-05-06 DIAGNOSIS — M6281 Muscle weakness (generalized): Secondary | ICD-10-CM | POA: Diagnosis not present

## 2023-05-06 DIAGNOSIS — G4700 Insomnia, unspecified: Secondary | ICD-10-CM | POA: Diagnosis not present

## 2023-05-06 DIAGNOSIS — E785 Hyperlipidemia, unspecified: Secondary | ICD-10-CM | POA: Diagnosis not present

## 2023-05-06 DIAGNOSIS — R262 Difficulty in walking, not elsewhere classified: Secondary | ICD-10-CM | POA: Diagnosis not present

## 2023-05-06 DIAGNOSIS — I10 Essential (primary) hypertension: Secondary | ICD-10-CM | POA: Diagnosis not present

## 2023-05-06 DIAGNOSIS — R52 Pain, unspecified: Secondary | ICD-10-CM | POA: Diagnosis not present

## 2023-05-06 DIAGNOSIS — L89152 Pressure ulcer of sacral region, stage 2: Secondary | ICD-10-CM | POA: Diagnosis not present

## 2023-05-06 DIAGNOSIS — Z86711 Personal history of pulmonary embolism: Secondary | ICD-10-CM | POA: Diagnosis not present

## 2023-05-06 DIAGNOSIS — E46 Unspecified protein-calorie malnutrition: Secondary | ICD-10-CM | POA: Diagnosis not present

## 2023-05-06 DIAGNOSIS — E876 Hypokalemia: Secondary | ICD-10-CM | POA: Diagnosis not present

## 2023-05-06 DIAGNOSIS — I493 Ventricular premature depolarization: Secondary | ICD-10-CM | POA: Diagnosis not present

## 2023-05-06 DIAGNOSIS — F32A Depression, unspecified: Secondary | ICD-10-CM | POA: Diagnosis not present

## 2023-05-06 DIAGNOSIS — R1311 Dysphagia, oral phase: Secondary | ICD-10-CM | POA: Diagnosis not present

## 2023-05-06 LAB — CBC
HCT: 31 % — ABNORMAL LOW (ref 36.0–46.0)
Hemoglobin: 10.4 g/dL — ABNORMAL LOW (ref 12.0–15.0)
MCH: 32.4 pg (ref 26.0–34.0)
MCHC: 33.5 g/dL (ref 30.0–36.0)
MCV: 96.6 fL (ref 80.0–100.0)
Platelets: 570 10*3/uL — ABNORMAL HIGH (ref 150–400)
RBC: 3.21 MIL/uL — ABNORMAL LOW (ref 3.87–5.11)
RDW: 14.6 % (ref 11.5–15.5)
WBC: 12.9 10*3/uL — ABNORMAL HIGH (ref 4.0–10.5)
nRBC: 0 % (ref 0.0–0.2)

## 2023-05-06 LAB — BASIC METABOLIC PANEL
Anion gap: 7 (ref 5–15)
BUN: 8 mg/dL (ref 8–23)
CO2: 25 mmol/L (ref 22–32)
Calcium: 8.8 mg/dL — ABNORMAL LOW (ref 8.9–10.3)
Chloride: 107 mmol/L (ref 98–111)
Creatinine, Ser: 0.63 mg/dL (ref 0.44–1.00)
GFR, Estimated: >60 mL/min (ref >60)
Glucose, Bld: 103 mg/dL — ABNORMAL HIGH (ref 70–99)
Potassium: 3.8 mmol/L (ref 3.5–5.1)
Sodium: 139 mmol/L (ref 135–145)

## 2023-05-06 LAB — MAGNESIUM: Magnesium: 1.7 mg/dL (ref 1.7–2.4)

## 2023-05-06 MED ORDER — MAGNESIUM SULFATE 2 GM/50ML IV SOLN
2.0000 g | Freq: Once | INTRAVENOUS | Status: AC
  Administered 2023-05-06: 2 g via INTRAVENOUS
  Filled 2023-05-06: qty 50

## 2023-05-06 MED ORDER — DILTIAZEM HCL ER COATED BEADS 180 MG PO CP24: 180.0000 mg | ORAL_CAPSULE | Freq: Every day | ORAL | Status: AC

## 2023-05-06 MED ORDER — ADULT MULTIVITAMIN W/MINERALS CH: 1.0000 | ORAL_TABLET | Freq: Every day | ORAL | Status: AC

## 2023-05-06 MED ORDER — PROSOURCE PLUS PO LIQD: 30.0000 mL | Freq: Three times a day (TID) | ORAL | Status: AC

## 2023-05-06 MED ORDER — ZINC SULFATE 220 (50 ZN) MG PO CAPS: 220.0000 mg | ORAL_CAPSULE | Freq: Every day | ORAL | Status: AC

## 2023-05-06 MED ORDER — POTASSIUM CHLORIDE CRYS ER 20 MEQ PO TBCR
20.0000 meq | EXTENDED_RELEASE_TABLET | Freq: Once | ORAL | Status: AC
  Administered 2023-05-06: 20 meq via ORAL
  Filled 2023-05-06: qty 1

## 2023-05-06 MED ORDER — NUCYNTA ER 200 MG PO TB12: 200.0000 mg | ORAL_TABLET | Freq: Two times a day (BID) | ORAL | 0 refills | Status: AC

## 2023-05-06 MED ORDER — ASCORBIC ACID 500 MG PO TABS: 500.0000 mg | ORAL_TABLET | Freq: Two times a day (BID) | ORAL | Status: AC

## 2023-05-06 MED ORDER — DULOXETINE HCL 60 MG PO CPEP: 60.0000 mg | ORAL_CAPSULE | Freq: Every day | ORAL | Status: AC

## 2023-05-06 NOTE — Consult Note (Signed)
[facility] Liaison Note  05/06/2023  Holly Bowen 1955/09/30 [id number]  Location: RN Hospital Liaison screened the patient remotely at [facility].  Insurance: [employer]   Holly Bowen is a 67 y.o. female who is a Primary Care Patient of Dorothey Baseman, MD-[facility]. The patient was screened for  day readmission hospitalization with noted medium risk score for unplanned readmission risk with 1 IP/1 ED in 6 months.  The patient was assessed for potential Care Management service needs for post hospital transition for care coordination. Review of patient's electronic medical record reveals patient was admitted with Atrial Fibrillation with RVR. Pt discharging to SNF level of care for ongoing rehab. Facility will continue to address pt's needs.    [facility] Care Management/Population Health does not replace or interfere with any arrangements made by the Inpatient Transition of Care team.   For questions contact:   Elliot Cousin, RN, [facility] Liaison [place]   [facility], Population Health Office Hours MTWF  8:00 am-6:00 pm Direct Dial: [phone] mobile [phone] [Office toll free line] Office Hours are M-F 8:30 - 5 pm [email or web address]

## 2023-05-06 NOTE — Consult Note (Addendum)
PHARMACY CONSULT NOTE - FOLLOW UP  Pharmacy Consult for Electrolyte Monitoring and Replacement   Recent Labs: Potassium (mmol/L)  Date Value  05/05/2023 3.6   Magnesium (mg/dL)  Date Value  16/03/9603 1.5 (L)   Calcium (mg/dL)  Date Value  54/01/8118 8.6 (L)   Albumin (g/dL)  Date Value  14/78/2956 3.2 (L)   Phosphorus (mg/dL)  Date Value  21/30/8657 2.6   Sodium (mmol/L)  Date Value  05/05/2023 137   Diet: Regular Pertinent Medications: NA  Assessment: BB is a 67 year old f who presented with persistent nauseous vomiting, diarrhea and generalized weakness. They have a past medical history of PVCs on flecainide, HTN, PE on Xarelto, and restrictive lung disease. Diagnosed with new onset afib RVR possibly secondary to dehydration and electrolyte imbalances from nausea, vomiting and diarrhea. They had severe hypokalemia on presentation with decreased Mg. This has since resolved and now have a goal of K > 4 and Mg >2 due to new onset afib. Pharmacy was consulted to manage this patient's electrolytes. CHA?DS?-VASc 4 (age, gender, HTN, CAD)  Goal of Therapy: Electrolytes WNL  K > 4 Mg > 2  Plan:  K = 3.8 ; ordered 20 mEq of PO KCl x 1 Mg = 1.7; ordered 2g IV Mg Sulfate x 1 Recheck BMP, Mg with AM labs  Holly Bowen, PharmD Pharmacy Resident  05/06/2023 7:15 AM

## 2023-05-06 NOTE — TOC Transition Note (Signed)
Transition of Care [facility]) - CM/SW Discharge Note   Patient Details  Name: Holly Bowen MRN: [id number] Date of Birth: Jan 06, 1956  Transition of Care [facility]) CM/SW Contact:  Liliana Cline, LCSW Phone Number: 05/06/2023, 1:17 PM   Clinical Narrative:    Discharge to [facility] in [place] today. Room E2. Confirmed with Admissions Worker filling in - Viola. Updated MD, RN, and patient. Patient's friend Toni Amend to transport. Asked RN to call report.    Final next level of care: Skilled Nursing Facility Barriers to Discharge: Barriers Resolved   Patient Goals and CMS Choice CMS Medicare.gov Compare Post Acute Care list provided to:: Patient Choice offered to / list presented to : Patient  Discharge Placement                Patient chooses bed at: Other - please specify in the comment section below: ([facility] - [place], [place]) Patient to be transferred to facility by: friend - Courtney   Patient and family notified of of transfer: 05/06/23  Discharge Plan and Services Additional resources added to the After Visit Summary for                                       Social Determinants of Health (SDOH) Interventions SDOH Screenings   Food Insecurity: No Food Insecurity (05/03/2023)  Housing: Low Risk  (05/03/2023)  Transportation Needs: No Transportation Needs (05/03/2023)  Utilities: Not At Risk (05/03/2023)  Financial Resource Strain: Patient Declined (09/24/2022)   [facility], [facility]  Physical Activity: Insufficiently Active (01/25/2022)   Received from [facility], [facility]  Social Connections: Unknown (10/01/2021)   Received from [facility], [facility]  Stress: Patient Declined (01/25/2022)   Received from [facility], [facility]  Tobacco Use: Low Risk  (05/02/2023)  Recent Concern: Tobacco Use - High Risk (03/18/2023)   Received from [facility]     Readmission Risk Interventions     No  data to display

## 2023-05-06 NOTE — Progress Notes (Signed)
Nursing Discharge Note  Name: Holly Bowen MRN: [id number] DOB: [birth year]  Admit Date: 05/02/2023 Discharge Date: 05/06/2023  Holly Bowen to be discharged to a Skilled Nursing Facility per MD order.  AVS completed, placed in discharge packet for facility review. Discharge packet compiled for facility. Family friend to provide transportation to [facility]   Report called to Holly Highland LPN at [facility].   Discharge Instructions   None     Allergies as of 05/06/2023       Reactions   Penicillins Anaphylaxis   Tongue and throat swelling Per admission at [facility] in 2017 - ceftriaxone given 11/6-11/04/2016   Lisinopril Swelling        Medication List     STOP taking these medications    amLODipine 10 MG tablet Commonly known as: NORVASC   busPIRone 30 MG tablet Commonly known as: BUSPAR   cloNIDine 0.1 MG tablet Commonly known as: CATAPRES   hydrALAZINE 25 MG tablet Commonly known as: APRESOLINE   methylPREDNISolone 4 MG Tbpk tablet Commonly known as: MEDROL DOSEPAK       TAKE these medications    (feeding supplement) PROSource Plus liquid Take 30 mLs by mouth 3 (three) times daily between meals.   acetaminophen 325 MG tablet Commonly known as: TYLENOL Take 650 mg by mouth every 6 (six) hours as needed for moderate pain.   ascorbic acid 500 MG tablet Commonly known as: VITAMIN C Take 1 tablet (500 mg total) by mouth 2 (two) times daily.   atorvastatin 20 MG tablet Commonly known as: LIPITOR Take 1 tablet by mouth daily.   diltiazem 180 MG 24 hr capsule Commonly known as: CARDIZEM CD Take 1 capsule (180 mg total) by mouth daily. Start taking on: May 07, 2023   DULoxetine 60 MG capsule Commonly known as: CYMBALTA Take 1 capsule (60 mg total) by mouth daily. Start taking on: May 07, 2023   flecainide 50 MG tablet Commonly known as: TAMBOCOR Take 1 tablet by mouth 2 (two) times daily.   metoprolol tartrate 50 MG  tablet Commonly known as: LOPRESSOR Take 50 mg by mouth 2 (two) times daily.   multivitamin with minerals Tabs tablet Take 1 tablet by mouth daily. Start taking on: May 07, 2023   Nucynta ER 200 MG Tb12 Generic drug: Tapentadol HCl Take 200 mg by mouth 2 (two) times daily.   risperiDONE 0.25 MG tablet Commonly known as: RISPERDAL Take 0.5 mg by mouth at bedtime.   rivaroxaban 20 MG Tabs tablet Commonly known as: XARELTO Take 20 mg by mouth daily with supper.   zinc sulfate (50mg  elemental zinc) 220 (50 Zn) MG capsule Take 1 capsule (220 mg total) by mouth daily. Start taking on: May 07, 2023        Discharge Instructions     Call MD for:  extreme fatigue   Complete by: As directed    Call MD for:  persistant dizziness or light-headedness   Complete by: As directed    Call MD for:  persistant nausea and vomiting   Complete by: As directed    Call MD for:  severe uncontrolled pain   Complete by: As directed    Call MD for:  temperature >100.4   Complete by: As directed    Diet general   Complete by: As directed    Discharge instructions   Complete by: As directed    1. You are being discharged to [facility] for rehabilitation. 2. Repeat  CBC in 1 week 3. Follow up with Dr. Terance Bowen after release from rehab. 4. Follow up with Dr. Juliann Bowen from cardiology in 1 week   Increase activity slowly   Complete by: As directed    No wound care   Complete by: As directed        Family friend/ Holly Bowen is  to provide transportation to facility for patient.  Patient discharged from hospital unit via wheelchair to private vehicle. Stable at time of discharge.

## 2023-05-06 NOTE — TOC Progression Note (Addendum)
Transition of Care [facility]) - Progression Note    Patient Details  Name: Holly Bowen MRN: [id number] Date of Birth: Feb 25, 1956  [facility] CM/SW Contact  Liliana Cline, LCSW Phone Number: 05/06/2023, 11:16 AM  Clinical Narrative:    Auth approved for [id number]. Asked MD when patient is expected to be medically ready.  11:35- Call to [facility]. Spoke with Holly Bowen who is covering for [employer]. Holly Bowen states they can take patient if before 3. Updated RN and MD. Updated patient, asked about transport as PT eval stated she did not need EMS-  she states her friend Holly Bowen may be able to transport her - CSW left a VM for [employer].   12:05- Spoke to patient's friend Holly Bowen who feels comfortable transporting patient to [facility], she will pick patient up around 1:30-2:00 pm, RN and MD updated.   Expected Discharge Plan: Skilled Nursing Facility Barriers to Discharge: Continued Medical Work up  Expected Discharge Plan and Services       Living arrangements for the past 2 months: Single Family Home                                       Social Determinants of Health (SDOH) Interventions SDOH Screenings   Food Insecurity: No Food Insecurity (05/03/2023)  Housing: Low Risk  (05/03/2023)  Transportation Needs: No Transportation Needs (05/03/2023)  Utilities: Not At Risk (05/03/2023)  Financial Resource Strain: Patient Declined (09/24/2022)   Received [facility], [facility]  Physical Activity: Insufficiently Active (01/25/2022)   Received from [facility], [facility]  Social Connections: Unknown (10/01/2021)   Received from [facility], [facility]  Stress: Patient Declined (01/25/2022)   Received from [facility], [facility]  Tobacco Use: Low Risk  (05/02/2023)  Recent Concern: Tobacco Use - High Risk (03/18/2023)   Received from [facility]    Readmission Risk Interventions     No data to display

## 2023-05-06 NOTE — Discharge Summary (Addendum)
Physician Discharge Summary   Patient: Holly Bowen MRN: [id number] DOB: 1955/08/07  Admit date:     05/02/2023  Discharge date: 05/06/23  Discharge Physician: Jonah Blue   PCP: Dorothey Baseman, MD   Recommendations at discharge:   You are being discharged to [facility] for rehabilitation. Repeat CBC in 1 week Follow up with Dr. Terance Hart after release from rehab. Follow up with Dr. 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  She was started on Cardizem infusion for rate control.  She was also found to have a UTI.  Assessment and Plan:  A-fib with RVR Suspect from dehydration and electrolyte derangements Given IV fluids for volume resuscitation on admission [facility] Cardiology consulted, has signed off  Weaned off Cardizem drip -> PO Diltiazem Continue home PO metoprolol 50 mg BID Continue home flecainide Continue Xarelto (pt on for Hx of PE)   UTI Urine culture with E coli, resistant to Ampicillin Will treat with Ceftriaxone x 3 doses Denies urinary symptoms but she was altered with n/v/d so perhaps this was a true infection   Intractable nauseous vomiting diarrhea  Improved Had a normal BM last night Cancel C diff and GI panel testing She continues to report anorexia and early satiety, although this appears to be slowly improving   Hyperglycemia  Non-fasting glucose 215 on admission No known history of diabetes Suspect this  was an acute phase reaction, as glucose has been well controlled since admission    Hx of PE Continue Xarelto   Generalized weakness Due to profound electrolyte abnormalities and severe dehydration and AKI PT OT evaluations, recommending SNF rehab which patient agrees with Fall precautions   Restrictive lung disease  Chronic, compensated   Pressure ulcer Pressure Injury 05/02/23 Coccyx Stage 2 -  Partial thickness loss of dermis presenting as a shallow open injury with a red, pink wound bed without slough. (Active)  05/02/23 2244  Location: Coccyx  Location Orientation:   Staging: Stage 2 -  Partial thickness loss of dermis presenting as a shallow open injury with a red, pink wound bed without slough.  Wound Description (Comments):   Present on Admission:  Yes    Nutrition Status Nutrition Problem: Inadequate oral intake Etiology: poor appetite Signs/Symptoms: meal completion < 25%, per patient/family report Interventions: Boost Breeze, MVI, Prostat, Liberalize Diet  Thrombocytosis Continues to mild increase Possibly reactive Will need repeat blood work next week    Chronic pain I have reviewed this patient in the [place] Controlled Substances Reporting System.  She is receiving medications from only one provider and appears to be taking them as prescribed. She is not at particularly high risk of opioid misuse, diversion, or overdose.  Continue Nucynta - printed rx provided for use at the facility Cymbalta and risperidone were continued on admission, although it appears that she was not taking these PTA; will continue  HTN Home medications have been changed She is now taking Diltiazem 180  mg daily, Metoprolol 50 mg BID  HLD Continue Lipitor        Consultants: Cardiology PT OT TOC team   Procedures: None   Antibiotics: Ceftriaxone 12/3-5  Pain control - [facility] Controlled Substance Reporting System database was reviewed. and patient was instructed, not  to drive, operate heavy machinery, perform activities at heights, swimming or participation in water activities or provide baby-sitting services while on Pain, Sleep and Anxiety Medications; until their outpatient Physician has advised to do so again. Also recommended to not to take more than prescribed Pain, Sleep and Anxiety Medications.   Disposition: Skilled nursing facility Diet recommendation:  Regular diet DISCHARGE MEDICATION: Allergies as of 05/06/2023       Reactions   Penicillins Anaphylaxis   Tongue and throat swelling Per admission at [facility] in 2017 - ceftriaxone given 11/6-11/04/2016   Lisinopril Swelling        Medication List     STOP taking these medications    amLODipine 10 MG tablet Commonly known as: NORVASC   busPIRone 30 MG tablet Commonly known as: BUSPAR   cloNIDine 0.1 MG tablet Commonly known as: CATAPRES   hydrALAZINE 25 MG tablet Commonly known as: APRESOLINE   methylPREDNISolone 4 MG Tbpk tablet Commonly known as: MEDROL DOSEPAK       TAKE these medications    (feeding supplement) PROSource Plus liquid Take 30 mLs by mouth 3 (three) times daily between meals.   acetaminophen 325 MG tablet Commonly known as: TYLENOL Take 650 mg by mouth every 6 (six) hours as needed for moderate pain.   ascorbic acid 500 MG tablet Commonly known as: VITAMIN C Take 1 tablet (500 mg total) by mouth 2 (two) times daily.   atorvastatin 20 MG tablet Commonly known as: LIPITOR Take 1 tablet by mouth daily.   diltiazem 180 MG 24 hr capsule Commonly known as: CARDIZEM CD Take 1 capsule (180 mg total) by mouth daily. Start taking on: May 07, 2023   DULoxetine 60 MG capsule Commonly known as: CYMBALTA Take 1 capsule (60 mg total) by mouth daily. Start taking on: May 07, 2023   flecainide 50 MG tablet Commonly known as: TAMBOCOR Take 1 tablet by mouth 2 (two) times daily.   metoprolol tartrate 50 MG tablet Commonly known as: LOPRESSOR Take  50 mg by mouth 2 (two) times daily.   multivitamin with minerals Tabs tablet Take 1 tablet by mouth daily. Start taking on: May 07, 2023   Nucynta ER 200 MG Tb12 Generic drug: Tapentadol HCl Take 200 mg by mouth 2 (two) times daily.   risperiDONE 0.25 MG tablet Commonly known as: RISPERDAL Take 0.5 mg by mouth at bedtime.   rivaroxaban 20 MG Tabs tablet Commonly known as: XARELTO Take 20 mg by mouth daily with supper.   zinc sulfate (50mg  elemental zinc) 220 (50 Zn) MG capsule Take 1 capsule (220 mg total) by mouth daily. Start taking on: May 07, 2023        Follow-up Information     Dorothyann Peng D, MD. Go in 1 week(s).   Specialties: Cardiology, Internal Medicine Contact information: [address] [place] [address] [phone]                Discharge Exam:   Subjective: Ate french toast for breakfast, ordered a baked potato for lunch - starting to eat more.  No new concerns, ready to go to rehab today.   Objective: Vitals:   05/06/23 0842 05/06/23 1233  BP: 129/79 118/77  Pulse: (!) 106 (!) 51  Resp: 20 (!) 22  Temp: 98.3 F (36.8 C) (!) 97.5 F (36.4 C)  SpO2: 97% 99%    Intake/Output Summary (Last 24 hours) at 05/06/2023 1300 Last data filed at 05/05/2023 1543 Gross per 24 hour  Intake 26.41 ml  Output --  Net 26.41 ml   Filed Weights   05/02/23 1235  Weight: 68 kg    Exam:  General:  Appears calm and comfortable and is in NAD, sitting up in bedside chair Eyes:   EOMI, normal lids, iris ENT:  grossly normal hearing, lips & tongue, mmm Neck:  no LAD, masses or thyromegaly Cardiovascular:  Irregularly irregular, rate controlled. No LE edema.  Respiratory:   CTA bilaterally with no wheezes/rales/rhonchi.  Normal respiratory effort. Abdomen:  soft, NT, ND Skin:  no rash or induration seen on limited exam Musculoskeletal:  grossly normal tone BUE/BLE, good ROM, no bony abnormality Psychiatric:  blunted mood and  affect, speech fluent and appropriate, AOx3 Neurologic:  CN 2-12 grossly intact, moves all extremities in coordinated fashion  Data Reviewed: I have reviewed the patient's lab results since admission.  Pertinent labs for today include:  Unremarkable BMP WBC 12.9, improving Hgb 10.4 Platelets 570    Condition at discharge: improving  The results of significant diagnostics from this hospitalization (including imaging, microbiology, ancillary and laboratory) are listed below for reference.   Imaging Studies: DG Chest Port 1 View  Result Date: 05/02/2023 CLINICAL DATA:  Weakness. EXAM: PORTABLE CHEST 1 VIEW COMPARISON:  11/07/2019. FINDINGS: Low lung volume. Redemonstration of elevated right hemidiaphragm. There is atelectasis/scarring at lung bases, similar to the prior study. There is nonspecific heterogeneous opacity in the relatively linear distribution overlying the left upper lung zone, which appears more pronounced than the prior radiograph. These may represent fibrosis/atelectasis however, correlate clinically to determine the need for further characterization with chest CT scan. Bilateral lung fields are otherwise clear. Bilateral costophrenic angles are clear. Stable cardio-mediastinal silhouette. Lower cervical spinal fixation hardware noted. No acute osseous abnormalities. The soft tissues are within normal limits. There are embolization coils overlying the left upper abdomen. IMPRESSION: *Redemonstration of atelectasis/scarring at the lung bases, similar to the prior study. *However, there is heterogeneous opacity overlying the left upper lung zone, which appears more pronounced than the prior radiograph. These may represent fibrosis/atelectasis; however, correlate clinically to determine the need for further characterization with chest CT scan. Electronically Signed   By: Jules Schick M.D.   On: 05/02/2023 15:41    Microbiology: Results for orders placed or performed during the  hospital encounter of 05/02/23  Blood culture (routine x 2)     Status: Abnormal   Collection Time: 05/02/23  1:14 PM   Specimen: BLOOD  Result Value Ref Range Status   Specimen Description   Final    BLOOD BLOOD LEFT HAND Performed at [facility], [address] [place], [place] [address]    Special Requests   Final    BOTTLES DRAWN AEROBIC AND ANAEROBIC Blood Culture results may not be optimal due to an inadequate volume of blood received in culture bottles Performed at [facility], [address], [place], [place] [address]    Culture  Setup Time   Final    GRAM POSITIVE COCCI AEROBIC BOTTLE ONLY Organism ID to follow CRITICAL RESULT CALLED TO, READ BACK BY AND VERIFIED WITH: TREY GREENWOOD 05/03/23 1018 MW Performed at [facility], [address]  [place], [place] [address]    Culture (A)  Final    STAPHYLOCOCCUS HOMINIS THE SIGNIFICANCE OF ISOLATING THIS ORGANISM FROM A SINGLE SET OF BLOOD CULTURES WHEN MULTIPLE SETS ARE DRAWN IS UNCERTAIN. PLEASE NOTIFY THE MICROBIOLOGY DEPARTMENT WITHIN ONE WEEK IF SPECIATION AND SENSITIVITIES ARE REQUIRED. Performed at [facility], [address], [place], [place] [address]    Report Status 05/04/2023 FINAL  Final  Blood Culture ID Panel (Reflexed)     Status: Abnormal   Collection Time: 05/02/23  1:14 PM  Result Value Ref Range Status   Enterococcus faecalis NOT DETECTED NOT DETECTED Final   Enterococcus Faecium NOT DETECTED NOT DETECTED Final   Listeria monocytogenes NOT DETECTED NOT DETECTED Final   Staphylococcus species DETECTED (A) NOT DETECTED Final    Comment: CRITICAL RESULT CALLED TO, READ BACK BY AND VERIFIED WITH: TREY GREENWOOD 05/03/23 1018 MW    Staphylococcus aureus (BCID) NOT DETECTED NOT DETECTED Final   Staphylococcus epidermidis NOT DETECTED NOT DETECTED Final   Staphylococcus lugdunensis NOT DETECTED NOT DETECTED Final   Streptococcus species NOT DETECTED NOT DETECTED Final    Streptococcus agalactiae NOT DETECTED NOT DETECTED Final   Streptococcus pneumoniae NOT DETECTED NOT DETECTED Final   Streptococcus pyogenes NOT DETECTED NOT DETECTED Final   A.calcoaceticus-baumannii NOT DETECTED NOT DETECTED Final   Bacteroides fragilis NOT DETECTED NOT DETECTED Final   Enterobacterales NOT DETECTED NOT DETECTED Final   Enterobacter cloacae complex NOT DETECTED NOT DETECTED Final   Escherichia coli NOT DETECTED NOT DETECTED Final   Klebsiella aerogenes NOT DETECTED NOT DETECTED Final   Klebsiella oxytoca NOT DETECTED NOT DETECTED Final   Klebsiella pneumoniae NOT DETECTED NOT DETECTED Final   Proteus species NOT DETECTED NOT DETECTED Final   Salmonella species NOT DETECTED NOT DETECTED Final   Serratia marcescens NOT DETECTED NOT DETECTED Final   Haemophilus influenzae NOT DETECTED NOT DETECTED Final   Neisseria meningitidis NOT DETECTED NOT DETECTED Final   Pseudomonas aeruginosa NOT DETECTED NOT DETECTED Final   Stenotrophomonas maltophilia NOT DETECTED NOT DETECTED Final   Candida albicans NOT DETECTED NOT DETECTED Final   Candida auris NOT DETECTED NOT DETECTED Final   Candida glabrata NOT DETECTED NOT DETECTED Final   Candida krusei NOT DETECTED NOT DETECTED Final   Candida parapsilosis NOT DETECTED NOT DETECTED Final   Candida tropicalis NOT DETECTED NOT DETECTED Final   Cryptococcus neoformans/gattii NOT DETECTED NOT DETECTED Final    Comment: Performed at [facility], [address] [place], [place] [address]  Blood culture (routine x 2)     Status: None (Preliminary result)   Collection Time: 05/02/23  1:37 PM   Specimen: BLOOD  Result Value Ref Range Status   Specimen Description BLOOD BLOOD LEFT ARM  Final   Special Requests   Final    BOTTLES DRAWN AEROBIC AND ANAEROBIC Blood Culture results may not be optimal due to an excessive volume of blood received in culture bottles   Culture   Final    NO GROWTH 4 DAYS Performed at [facility], [address] [place], [place] [address]    Report Status PENDING  Incomplete  Urine Culture     Status: Abnormal   Collection Time: 05/02/23  1:37 PM   Specimen: Urine, Clean Catch  Result Value Ref Range Status   Specimen Description   Final    URINE, CLEAN CATCH Performed at [facility], [address] [place], [place] [address]    Special Requests  Final    NONE Performed at [facility], [address] [place], [place] [address]    Culture >=100,000 COLONIES/mL ESCHERICHIA COLI (A)  Final   Report Status 05/04/2023 FINAL  Final   Organism ID, Bacteria ESCHERICHIA COLI (A)  Final      Susceptibility   Escherichia coli - MIC*    AMPICILLIN 16 INTERMEDIATE Intermediate     CEFAZOLIN <=4 SENSITIVE Sensitive     CEFEPIME <=0.12 SENSITIVE Sensitive     CEFTRIAXONE <=0.25 SENSITIVE Sensitive     CIPROFLOXACIN <=0.25 SENSITIVE Sensitive     GENTAMICIN <=1 SENSITIVE Sensitive     IMIPENEM <=0.25 SENSITIVE Sensitive     NITROFURANTOIN <=16 SENSITIVE Sensitive     TRIMETH/SULFA <=20 SENSITIVE Sensitive     AMPICILLIN/SULBACTAM 4 SENSITIVE Sensitive     PIP/TAZO <=4 SENSITIVE Sensitive ug/mL    * >=100,000 COLONIES/mL ESCHERICHIA COLI    Labs: CBC: Recent Labs  Lab 05/02/23 1236 05/03/23 0633 05/04/23 0438 05/05/23 0405 05/06/23 0629  WBC 19.9* 21.7* 17.1* 15.9* 12.9*  HGB 13.0 10.1* 10.7* 9.8* 10.4*  HCT 36.4 29.4* 32.1* 29.1* 31.0*  MCV 88.8 93.3 96.1 95.1 96.6  PLT 510* 422* 469* 487* 570*   Basic Metabolic Panel: Recent Labs  Lab 05/02/23 1236 05/02/23 2203 05/03/23 0633 05/04/23 0438 05/05/23 0405 05/06/23 0629  NA 126* 130* 132* 137 137 139  K 2.7* 3.7 3.8 3.7 3.6 3.8  CL 89* 100 107 106 105 107  CO2 21* 21* 19* 23 23 25   GLUCOSE 215* 97 104* 87 92 103*  BUN 37* 26* 23 16 10 8   CREATININE 1.27* 0.75 0.78 0.74 0.73 0.63  CALCIUM 9.1 7.8* 7.9* 8.7* 8.6* 8.8*  MG 1.9  --   --  2.1 1.5* 1.7  PHOS 2.4*  --   --  2.6   --   --    Liver Function Tests: Recent Labs  Lab 05/02/23 1236  AST 46*  ALT 33  ALKPHOS 102  BILITOT 1.1  PROT 6.7  ALBUMIN 3.2*   CBG: No results for input(s): "GLUCAP" in the last 168 hours.  Discharge time spent: greater than 30 minutes.  Signed: Jonah Blue, MD [facility] 05/06/2023

## 2023-05-07 LAB — CULTURE, BLOOD (ROUTINE X 2): Culture: NO GROWTH

## 2023-05-09 DIAGNOSIS — I4891 Unspecified atrial fibrillation: Secondary | ICD-10-CM | POA: Diagnosis not present

## 2023-05-09 DIAGNOSIS — G4700 Insomnia, unspecified: Secondary | ICD-10-CM | POA: Diagnosis not present

## 2023-05-09 DIAGNOSIS — L89152 Pressure ulcer of sacral region, stage 2: Secondary | ICD-10-CM | POA: Diagnosis not present

## 2023-05-09 DIAGNOSIS — J984 Other disorders of lung: Secondary | ICD-10-CM | POA: Diagnosis not present

## 2023-05-10 DIAGNOSIS — Z86711 Personal history of pulmonary embolism: Secondary | ICD-10-CM | POA: Diagnosis not present

## 2023-05-10 DIAGNOSIS — E46 Unspecified protein-calorie malnutrition: Secondary | ICD-10-CM | POA: Diagnosis not present

## 2023-05-10 DIAGNOSIS — M6281 Muscle weakness (generalized): Secondary | ICD-10-CM | POA: Diagnosis not present

## 2023-05-10 DIAGNOSIS — I4891 Unspecified atrial fibrillation: Secondary | ICD-10-CM | POA: Diagnosis not present

## 2023-05-16 DIAGNOSIS — E876 Hypokalemia: Secondary | ICD-10-CM | POA: Diagnosis not present

## 2023-05-16 DIAGNOSIS — I10 Essential (primary) hypertension: Secondary | ICD-10-CM | POA: Diagnosis not present

## 2023-05-16 DIAGNOSIS — M6281 Muscle weakness (generalized): Secondary | ICD-10-CM | POA: Diagnosis not present

## 2023-05-16 DIAGNOSIS — G8929 Other chronic pain: Secondary | ICD-10-CM | POA: Diagnosis not present

## 2023-05-19 DIAGNOSIS — F324 Major depressive disorder, single episode, in partial remission: Secondary | ICD-10-CM | POA: Diagnosis not present

## 2023-05-19 DIAGNOSIS — M6281 Muscle weakness (generalized): Secondary | ICD-10-CM | POA: Diagnosis not present

## 2023-05-19 DIAGNOSIS — I4891 Unspecified atrial fibrillation: Secondary | ICD-10-CM | POA: Diagnosis not present

## 2023-05-19 DIAGNOSIS — E46 Unspecified protein-calorie malnutrition: Secondary | ICD-10-CM | POA: Diagnosis not present

## 2023-05-23 DIAGNOSIS — E860 Dehydration: Secondary | ICD-10-CM | POA: Diagnosis not present

## 2023-05-23 DIAGNOSIS — Z86711 Personal history of pulmonary embolism: Secondary | ICD-10-CM | POA: Diagnosis not present

## 2023-05-23 DIAGNOSIS — F419 Anxiety disorder, unspecified: Secondary | ICD-10-CM | POA: Diagnosis not present

## 2023-05-23 DIAGNOSIS — I493 Ventricular premature depolarization: Secondary | ICD-10-CM | POA: Diagnosis not present

## 2023-05-23 DIAGNOSIS — R002 Palpitations: Secondary | ICD-10-CM | POA: Diagnosis not present

## 2023-05-23 DIAGNOSIS — I10 Essential (primary) hypertension: Secondary | ICD-10-CM | POA: Diagnosis not present

## 2023-05-23 DIAGNOSIS — I340 Nonrheumatic mitral (valve) insufficiency: Secondary | ICD-10-CM | POA: Diagnosis not present

## 2023-05-23 DIAGNOSIS — J984 Other disorders of lung: Secondary | ICD-10-CM | POA: Diagnosis not present

## 2023-05-23 DIAGNOSIS — J449 Chronic obstructive pulmonary disease, unspecified: Secondary | ICD-10-CM | POA: Diagnosis not present

## 2023-05-23 DIAGNOSIS — R011 Cardiac murmur, unspecified: Secondary | ICD-10-CM | POA: Diagnosis not present

## 2023-05-23 DIAGNOSIS — R001 Bradycardia, unspecified: Secondary | ICD-10-CM | POA: Diagnosis not present

## 2023-05-27 DIAGNOSIS — F419 Anxiety disorder, unspecified: Secondary | ICD-10-CM | POA: Diagnosis not present

## 2023-05-27 DIAGNOSIS — E162 Hypoglycemia, unspecified: Secondary | ICD-10-CM | POA: Diagnosis not present

## 2023-05-27 DIAGNOSIS — E860 Dehydration: Secondary | ICD-10-CM | POA: Diagnosis not present

## 2023-05-27 DIAGNOSIS — E46 Unspecified protein-calorie malnutrition: Secondary | ICD-10-CM | POA: Diagnosis not present

## 2023-05-27 DIAGNOSIS — G8929 Other chronic pain: Secondary | ICD-10-CM | POA: Diagnosis not present

## 2023-05-27 DIAGNOSIS — I4891 Unspecified atrial fibrillation: Secondary | ICD-10-CM | POA: Diagnosis not present

## 2023-05-27 DIAGNOSIS — Z9081 Acquired absence of spleen: Secondary | ICD-10-CM | POA: Diagnosis not present

## 2023-05-27 DIAGNOSIS — B9620 Unspecified Escherichia coli [E. coli] as the cause of diseases classified elsewhere: Secondary | ICD-10-CM | POA: Diagnosis not present

## 2023-05-27 DIAGNOSIS — E039 Hypothyroidism, unspecified: Secondary | ICD-10-CM | POA: Diagnosis not present

## 2023-05-27 DIAGNOSIS — E871 Hypo-osmolality and hyponatremia: Secondary | ICD-10-CM | POA: Diagnosis not present

## 2023-05-27 DIAGNOSIS — I10 Essential (primary) hypertension: Secondary | ICD-10-CM | POA: Diagnosis not present

## 2023-05-27 DIAGNOSIS — N390 Urinary tract infection, site not specified: Secondary | ICD-10-CM | POA: Diagnosis not present

## 2023-05-27 DIAGNOSIS — F32A Depression, unspecified: Secondary | ICD-10-CM | POA: Diagnosis not present

## 2023-05-27 DIAGNOSIS — D75838 Other thrombocytosis: Secondary | ICD-10-CM | POA: Diagnosis not present

## 2023-05-27 DIAGNOSIS — Z90710 Acquired absence of both cervix and uterus: Secondary | ICD-10-CM | POA: Diagnosis not present

## 2023-05-27 DIAGNOSIS — E876 Hypokalemia: Secondary | ICD-10-CM | POA: Diagnosis not present

## 2023-05-27 DIAGNOSIS — J984 Other disorders of lung: Secondary | ICD-10-CM | POA: Diagnosis not present

## 2023-05-27 DIAGNOSIS — Z7901 Long term (current) use of anticoagulants: Secondary | ICD-10-CM | POA: Diagnosis not present

## 2023-05-27 DIAGNOSIS — F3341 Major depressive disorder, recurrent, in partial remission: Secondary | ICD-10-CM | POA: Diagnosis not present

## 2023-05-27 DIAGNOSIS — I493 Ventricular premature depolarization: Secondary | ICD-10-CM | POA: Diagnosis not present

## 2023-05-27 DIAGNOSIS — M1990 Unspecified osteoarthritis, unspecified site: Secondary | ICD-10-CM | POA: Diagnosis not present

## 2023-05-27 DIAGNOSIS — E785 Hyperlipidemia, unspecified: Secondary | ICD-10-CM | POA: Diagnosis not present

## 2023-05-27 DIAGNOSIS — K649 Unspecified hemorrhoids: Secondary | ICD-10-CM | POA: Diagnosis not present

## 2023-05-27 DIAGNOSIS — M549 Dorsalgia, unspecified: Secondary | ICD-10-CM | POA: Diagnosis not present

## 2023-06-02 DIAGNOSIS — B9620 Unspecified Escherichia coli [E. coli] as the cause of diseases classified elsewhere: Secondary | ICD-10-CM | POA: Diagnosis not present

## 2023-06-02 DIAGNOSIS — E871 Hypo-osmolality and hyponatremia: Secondary | ICD-10-CM | POA: Diagnosis not present

## 2023-06-02 DIAGNOSIS — I493 Ventricular premature depolarization: Secondary | ICD-10-CM | POA: Diagnosis not present

## 2023-06-02 DIAGNOSIS — N390 Urinary tract infection, site not specified: Secondary | ICD-10-CM | POA: Diagnosis not present

## 2023-06-02 DIAGNOSIS — E876 Hypokalemia: Secondary | ICD-10-CM | POA: Diagnosis not present

## 2023-06-02 DIAGNOSIS — I4891 Unspecified atrial fibrillation: Secondary | ICD-10-CM | POA: Diagnosis not present

## 2023-06-02 DIAGNOSIS — G8929 Other chronic pain: Secondary | ICD-10-CM | POA: Diagnosis not present

## 2023-06-22 DIAGNOSIS — I10 Essential (primary) hypertension: Secondary | ICD-10-CM | POA: Diagnosis not present

## 2023-06-22 DIAGNOSIS — E860 Dehydration: Secondary | ICD-10-CM | POA: Diagnosis not present

## 2023-06-22 DIAGNOSIS — I4891 Unspecified atrial fibrillation: Secondary | ICD-10-CM | POA: Diagnosis not present

## 2023-06-27 DIAGNOSIS — F419 Anxiety disorder, unspecified: Secondary | ICD-10-CM | POA: Diagnosis not present

## 2023-06-27 DIAGNOSIS — I493 Ventricular premature depolarization: Secondary | ICD-10-CM | POA: Diagnosis not present

## 2023-06-27 DIAGNOSIS — R531 Weakness: Secondary | ICD-10-CM | POA: Diagnosis not present

## 2023-06-27 DIAGNOSIS — R0602 Shortness of breath: Secondary | ICD-10-CM | POA: Diagnosis not present

## 2023-06-27 DIAGNOSIS — I10 Essential (primary) hypertension: Secondary | ICD-10-CM | POA: Diagnosis not present

## 2023-06-27 DIAGNOSIS — K219 Gastro-esophageal reflux disease without esophagitis: Secondary | ICD-10-CM | POA: Diagnosis not present

## 2023-06-27 DIAGNOSIS — J449 Chronic obstructive pulmonary disease, unspecified: Secondary | ICD-10-CM | POA: Diagnosis not present

## 2023-06-27 DIAGNOSIS — I340 Nonrheumatic mitral (valve) insufficiency: Secondary | ICD-10-CM | POA: Diagnosis not present

## 2023-06-27 DIAGNOSIS — J984 Other disorders of lung: Secondary | ICD-10-CM | POA: Diagnosis not present

## 2023-06-27 DIAGNOSIS — R011 Cardiac murmur, unspecified: Secondary | ICD-10-CM | POA: Diagnosis not present

## 2023-07-11 DIAGNOSIS — E876 Hypokalemia: Secondary | ICD-10-CM | POA: Diagnosis not present

## 2023-07-11 DIAGNOSIS — D649 Anemia, unspecified: Secondary | ICD-10-CM | POA: Diagnosis not present

## 2023-09-26 DIAGNOSIS — D649 Anemia, unspecified: Secondary | ICD-10-CM | POA: Diagnosis not present

## 2023-09-26 DIAGNOSIS — F32A Depression, unspecified: Secondary | ICD-10-CM | POA: Diagnosis not present

## 2023-09-26 DIAGNOSIS — Z0000 Encounter for general adult medical examination without abnormal findings: Secondary | ICD-10-CM | POA: Diagnosis not present

## 2023-09-26 DIAGNOSIS — I10 Essential (primary) hypertension: Secondary | ICD-10-CM | POA: Diagnosis not present

## 2023-09-26 DIAGNOSIS — G894 Chronic pain syndrome: Secondary | ICD-10-CM | POA: Diagnosis not present

## 2023-09-26 DIAGNOSIS — I4891 Unspecified atrial fibrillation: Secondary | ICD-10-CM | POA: Diagnosis not present

## 2023-09-26 DIAGNOSIS — Z79899 Other long term (current) drug therapy: Secondary | ICD-10-CM | POA: Diagnosis not present

## 2023-09-26 DIAGNOSIS — E785 Hyperlipidemia, unspecified: Secondary | ICD-10-CM | POA: Diagnosis not present

## 2023-09-26 DIAGNOSIS — Z1231 Encounter for screening mammogram for malignant neoplasm of breast: Secondary | ICD-10-CM | POA: Diagnosis not present

## 2023-09-26 DIAGNOSIS — R7309 Other abnormal glucose: Secondary | ICD-10-CM | POA: Diagnosis not present

## 2024-01-18 DIAGNOSIS — R2241 Localized swelling, mass and lump, right lower limb: Secondary | ICD-10-CM | POA: Diagnosis not present

## 2024-01-23 DIAGNOSIS — M25561 Pain in right knee: Secondary | ICD-10-CM | POA: Diagnosis not present

## 2024-01-23 DIAGNOSIS — M2391 Unspecified internal derangement of right knee: Secondary | ICD-10-CM | POA: Diagnosis not present

## 2024-02-13 DIAGNOSIS — M2391 Unspecified internal derangement of right knee: Secondary | ICD-10-CM | POA: Diagnosis not present

## 2024-02-14 DIAGNOSIS — K5909 Other constipation: Secondary | ICD-10-CM | POA: Diagnosis not present

## 2024-02-14 DIAGNOSIS — Z7901 Long term (current) use of anticoagulants: Secondary | ICD-10-CM | POA: Diagnosis not present

## 2024-02-14 DIAGNOSIS — Z860101 Personal history of adenomatous and serrated colon polyps: Secondary | ICD-10-CM | POA: Diagnosis not present

## 2024-02-15 DIAGNOSIS — M1711 Unilateral primary osteoarthritis, right knee: Secondary | ICD-10-CM | POA: Diagnosis not present

## 2024-02-15 DIAGNOSIS — S83271A Complex tear of lateral meniscus, current injury, right knee, initial encounter: Secondary | ICD-10-CM | POA: Diagnosis not present

## 2024-02-15 DIAGNOSIS — S83231A Complex tear of medial meniscus, current injury, right knee, initial encounter: Secondary | ICD-10-CM | POA: Diagnosis not present

## 2024-02-15 DIAGNOSIS — M84361A Stress fracture, right tibia, initial encounter for fracture: Secondary | ICD-10-CM | POA: Diagnosis not present

## 2024-02-15 DIAGNOSIS — M94261 Chondromalacia, right knee: Secondary | ICD-10-CM | POA: Diagnosis not present

## 2024-02-15 DIAGNOSIS — M7121 Synovial cyst of popliteal space [Baker], right knee: Secondary | ICD-10-CM | POA: Diagnosis not present

## 2024-02-15 DIAGNOSIS — S82132A Displaced fracture of medial condyle of left tibia, initial encounter for closed fracture: Secondary | ICD-10-CM | POA: Diagnosis not present

## 2024-02-15 DIAGNOSIS — S83242A Other tear of medial meniscus, current injury, left knee, initial encounter: Secondary | ICD-10-CM | POA: Diagnosis not present

## 2024-02-15 DIAGNOSIS — M1712 Unilateral primary osteoarthritis, left knee: Secondary | ICD-10-CM | POA: Diagnosis not present

## 2024-03-19 DIAGNOSIS — M84361D Stress fracture, right tibia, subsequent encounter for fracture with routine healing: Secondary | ICD-10-CM | POA: Diagnosis not present

## 2024-03-19 DIAGNOSIS — S83241D Other tear of medial meniscus, current injury, right knee, subsequent encounter: Secondary | ICD-10-CM | POA: Diagnosis not present

## 2024-03-19 DIAGNOSIS — M2241 Chondromalacia patellae, right knee: Secondary | ICD-10-CM | POA: Diagnosis not present

## 2024-03-19 DIAGNOSIS — M1712 Unilateral primary osteoarthritis, left knee: Secondary | ICD-10-CM | POA: Diagnosis not present

## 2024-03-19 DIAGNOSIS — S82132D Displaced fracture of medial condyle of left tibia, subsequent encounter for closed fracture with routine healing: Secondary | ICD-10-CM | POA: Diagnosis not present

## 2024-03-27 DIAGNOSIS — I10 Essential (primary) hypertension: Secondary | ICD-10-CM | POA: Diagnosis not present

## 2024-03-27 DIAGNOSIS — F32A Depression, unspecified: Secondary | ICD-10-CM | POA: Diagnosis not present

## 2024-03-27 DIAGNOSIS — G8929 Other chronic pain: Secondary | ICD-10-CM | POA: Diagnosis not present

## 2024-03-27 DIAGNOSIS — E785 Hyperlipidemia, unspecified: Secondary | ICD-10-CM | POA: Diagnosis not present

## 2024-03-27 DIAGNOSIS — M545 Low back pain, unspecified: Secondary | ICD-10-CM | POA: Diagnosis not present

## 2024-03-27 DIAGNOSIS — F419 Anxiety disorder, unspecified: Secondary | ICD-10-CM | POA: Diagnosis not present

## 2024-03-27 DIAGNOSIS — R7309 Other abnormal glucose: Secondary | ICD-10-CM | POA: Diagnosis not present

## 2024-03-27 DIAGNOSIS — Z1231 Encounter for screening mammogram for malignant neoplasm of breast: Secondary | ICD-10-CM | POA: Diagnosis not present

## 2024-03-27 DIAGNOSIS — I4891 Unspecified atrial fibrillation: Secondary | ICD-10-CM | POA: Diagnosis not present

## 2024-04-12 DIAGNOSIS — M25561 Pain in right knee: Secondary | ICD-10-CM | POA: Diagnosis not present

## 2024-04-18 DIAGNOSIS — M84361D Stress fracture, right tibia, subsequent encounter for fracture with routine healing: Secondary | ICD-10-CM | POA: Diagnosis not present

## 2024-04-18 DIAGNOSIS — M1711 Unilateral primary osteoarthritis, right knee: Secondary | ICD-10-CM | POA: Diagnosis not present

## 2024-04-19 DIAGNOSIS — M25561 Pain in right knee: Secondary | ICD-10-CM | POA: Diagnosis not present

## 2024-04-23 DIAGNOSIS — M25561 Pain in right knee: Secondary | ICD-10-CM | POA: Diagnosis not present

## 2024-05-02 DIAGNOSIS — M25561 Pain in right knee: Secondary | ICD-10-CM | POA: Diagnosis not present

## 2024-05-04 NOTE — Progress Notes (Signed)
 Holly Bowen                                          MRN: [id number]   05/04/2024   The [facility] Quality Team Specialist reviewed this patient medical record for the purposes of chart review for care gap closure. The following were reviewed: chart review for care gap closure-controlling blood pressure.    [facility] Quality Team

## 2024-06-25 ENCOUNTER — Other Ambulatory Visit: Payer: Self-pay | Admitting: Internal Medicine

## 2024-06-25 DIAGNOSIS — R0602 Shortness of breath: Secondary | ICD-10-CM

## 2024-06-25 DIAGNOSIS — R9430 Abnormal result of cardiovascular function study, unspecified: Secondary | ICD-10-CM

## 2024-07-23 ENCOUNTER — Ambulatory Visit

## 2024-08-01 ENCOUNTER — Ambulatory Visit: Admit: 2024-08-01 | Admitting: Gastroenterology
